# Patient Record
Sex: Male | Born: 1945 | Race: White | Hispanic: No | Marital: Married | Smoking: Never smoker
Health system: Southern US, Community
[De-identification: ages and names within clinical notes are randomized; demographics above are authoritative.]

## PROBLEM LIST (undated history)

## (undated) DIAGNOSIS — R059 Cough, unspecified: Secondary | ICD-10-CM

## (undated) DIAGNOSIS — R000 Tachycardia, unspecified: Secondary | ICD-10-CM

## (undated) DIAGNOSIS — N400 Enlarged prostate without lower urinary tract symptoms: Secondary | ICD-10-CM

## (undated) DIAGNOSIS — R05 Cough: Secondary | ICD-10-CM

## (undated) DIAGNOSIS — K219 Gastro-esophageal reflux disease without esophagitis: Secondary | ICD-10-CM

## (undated) DIAGNOSIS — I10 Essential (primary) hypertension: Secondary | ICD-10-CM

## (undated) DIAGNOSIS — J329 Chronic sinusitis, unspecified: Secondary | ICD-10-CM

## (undated) DIAGNOSIS — Z87442 Personal history of urinary calculi: Secondary | ICD-10-CM

## (undated) HISTORY — PX: TONSILLECTOMY: SUR1361

## (undated) HISTORY — PX: CATARACT EXTRACTION, BILATERAL: SHX1313

## (undated) HISTORY — PX: LITHOTRIPSY: SUR834

## (undated) HISTORY — PX: BACK SURGERY: SHX140

## (undated) HISTORY — PX: CHOLECYSTECTOMY: SHX55

## (undated) HISTORY — DX: Tachycardia, unspecified: R00.0

## (undated) HISTORY — PX: COLONOSCOPY W/ POLYPECTOMY: SHX1380

---

## 2003-08-26 ENCOUNTER — Ambulatory Visit (HOSPITAL_COMMUNITY): Admission: RE | Admit: 2003-08-26 | Discharge: 2003-08-26 | Payer: Self-pay | Admitting: Gastroenterology

## 2003-08-26 ENCOUNTER — Encounter (INDEPENDENT_AMBULATORY_CARE_PROVIDER_SITE_OTHER): Payer: Self-pay | Admitting: Specialist

## 2009-08-05 ENCOUNTER — Emergency Department (HOSPITAL_COMMUNITY): Admission: EM | Admit: 2009-08-05 | Discharge: 2009-08-05 | Payer: Self-pay | Admitting: Family Medicine

## 2010-04-23 ENCOUNTER — Emergency Department (HOSPITAL_COMMUNITY): Admission: EM | Admit: 2010-04-23 | Discharge: 2010-04-23 | Payer: Self-pay | Admitting: Emergency Medicine

## 2010-10-15 LAB — CBC
Hemoglobin: 17.5 g/dL — ABNORMAL HIGH (ref 13.0–17.0)
MCHC: 35.2 g/dL (ref 30.0–36.0)
Platelets: 233 10*3/uL (ref 150–400)
RDW: 12.5 % (ref 11.5–15.5)

## 2010-10-15 LAB — DIFFERENTIAL
Basophils Absolute: 0 10*3/uL (ref 0.0–0.1)
Basophils Relative: 0 % (ref 0–1)
Neutro Abs: 4.8 10*3/uL (ref 1.7–7.7)
Neutrophils Relative %: 62 % (ref 43–77)

## 2010-10-15 LAB — POCT CARDIAC MARKERS
CKMB, poc: <1 ng/mL — ABNORMAL LOW (ref 1.0–8.0)
Myoglobin, poc: 89.2 ng/mL (ref 12–200)

## 2010-10-15 LAB — BASIC METABOLIC PANEL
BUN: 14 mg/dL (ref 6–23)
Calcium: 10 mg/dL (ref 8.4–10.5)
Creatinine, Ser: 1.05 mg/dL (ref 0.4–1.5)
GFR calc non Af Amer: >60 mL/min (ref >60)
Glucose, Bld: 102 mg/dL — ABNORMAL HIGH (ref 70–99)
Sodium: 141 mEq/L (ref 135–145)

## 2010-12-18 NOTE — Op Note (Signed)
NAME:  Randall Lopez, Randall Lopez                     ACCOUNT NO.:  [id number]   MEDICAL RECORD NO.:  [id number]                   PATIENT TYPE:  AMB   LOCATION:  ENDO                                 FACILITY:  [facility]   PHYSICIAN:  Danise Edge, M.D.                DATE OF BIRTH:  [birth year]   DATE OF PROCEDURE:  08/26/2003  DATE OF DISCHARGE:                                 OPERATIVE REPORT   PROCEDURE:  Colonoscopy and polypectomy.   PROCEDURE INDICATION:  Mr. Randall Lopez is a 65 year old male born  [birth year].  Mr. Randall Lopez is scheduled to undergo his first screening  colonoscopy with polypectomy to prevent colon cancer.  His health  maintenance flexible proctosigmoidoscopy in February 1999 was normal.   ENDOSCOPIST:  Danise Edge, M.D.   PREMEDICATION:  Versed 5 mg, Demerol 50 mg.   DESCRIPTION OF PROCEDURE:  After obtaining informed consent, Mr. Randall Lopez was  placed in the left lateral decubitus position.  I administered intravenous  Demerol and intravenous Versed to achieve conscious sedation for the  procedure.  The patient's blood pressure, oxygen saturation, and cardiac  rhythm were monitored throughout the procedure and documented in the medical  record.   Anal inspection was normal, digital rectal exam revealed a non-nodular  prostate.  The Olympus adjustable pediatric colonoscope was introduced into  the rectum and advanced to the cecum.  Colonic preparation for the exam  today was excellent.   Rectum:  Normal.   Sigmoid colon and descending colon:  From the distal sigmoid colon at 30 cm  from the anal verge a 2-mm sessile polyp was removed with the electrocautery  snare and submitted for pathological interpretation.   Splenic flexure:  Normal.   Transverse colon:  Normal.   Hepatic flexure:  Normal.   Ascending colon:  Normal.   Cecum and ileocecal valve:  Normal.   ASSESSMENT:  From the distal sigmoid colon, a 2-mm sessile polyp was removed  with  the electrocautery snare; otherwise normal proctocolonoscopy to the  cecum.   RECOMMENDATION:  Repeat colonoscopy in 5 years if distal sigmoid colon polyp  returns neoplastic pathologically.  __________                                               Danise Edge, M.D.    MJ/MEDQ  D:  08/26/2003  T:  08/26/2003  Job:  [id number]

## 2011-12-09 ENCOUNTER — Ambulatory Visit
Admission: RE | Admit: 2011-12-09 | Discharge: 2011-12-09 | Disposition: A | Discharge status: Home / Self Care | Payer: Medicare Other | Source: Ambulatory Visit | Attending: Family Medicine | Admitting: Family Medicine

## 2011-12-09 ENCOUNTER — Other Ambulatory Visit: Payer: Self-pay | Admitting: Family Medicine

## 2011-12-09 DIAGNOSIS — M25569 Pain in unspecified knee: Secondary | ICD-10-CM

## 2011-12-11 ENCOUNTER — Emergency Department (HOSPITAL_COMMUNITY)
Admission: EM | Admit: 2011-12-11 | Discharge: 2011-12-11 | Disposition: A | Discharge status: Home / Self Care | Payer: Medicare Other | Attending: Emergency Medicine | Admitting: Emergency Medicine

## 2011-12-11 ENCOUNTER — Encounter (HOSPITAL_COMMUNITY): Payer: Self-pay | Admitting: *Deleted

## 2011-12-11 DIAGNOSIS — M25561 Pain in right knee: Secondary | ICD-10-CM

## 2011-12-11 DIAGNOSIS — K219 Gastro-esophageal reflux disease without esophagitis: Secondary | ICD-10-CM | POA: Insufficient documentation

## 2011-12-11 DIAGNOSIS — I10 Essential (primary) hypertension: Secondary | ICD-10-CM | POA: Insufficient documentation

## 2011-12-11 DIAGNOSIS — M25569 Pain in unspecified knee: Secondary | ICD-10-CM | POA: Insufficient documentation

## 2011-12-11 HISTORY — DX: Gastro-esophageal reflux disease without esophagitis: K21.9

## 2011-12-11 HISTORY — DX: Essential (primary) hypertension: I10

## 2011-12-11 MED ORDER — HYDROMORPHONE HCL PF 1 MG/ML IJ SOLN
1.0000 mg | Freq: Once | INTRAMUSCULAR | Status: AC
  Administered 2011-12-11: 1 mg via INTRAMUSCULAR
  Filled 2011-12-11: qty 1

## 2011-12-11 MED ORDER — IBUPROFEN 600 MG PO TABS: 600.0000 mg | ORAL_TABLET | Freq: Three times a day (TID) | ORAL | Status: DC

## 2011-12-11 MED ORDER — HYDROCODONE-ACETAMINOPHEN 5-325 MG PO TABS: 1.0000 | ORAL_TABLET | ORAL | Status: DC | PRN

## 2011-12-11 NOTE — ED Provider Notes (Signed)
History     CSN: [id number]  Arrival date & time 12/11/11  1008   First MD Initiated Contact with Patient 12/11/11 1010      Chief Complaint  Patient presents with  . Knee Pain    right    (Consider location/radiation/quality/duration/timing/severity/associated sxs/prior treatment) HPI Comments: Patient reports he has had intermittent knee pain chronically until 3 days ago when the pain became much worse.  Pain is located over the medial aspect with radiation down into shin anteriorly.  Pain is described as achy when not using the joint and sharp when bearing weight.  Was seen by PCP and given vicodin (1tabQ6) and prednisone, negative xray, was referred to Dr Rennis Chris.  Patient has not been able to speak with anyone in Dr Dub Mikes office yet.  Denies fevers, injury, weakness or numbness, swelling or redness. Pt has a hx of gout in his feet but states this is nothing like his previous gout.  Has attempted to walk with crutches but having the leg hang free causes more pain, as well as strain in his low back.    Patient is a 66 y.o. male presenting with knee pain. The history is provided by the patient.  Knee Pain Pertinent negatives include no chills, fever, numbness, rash or weakness.    Past Medical History  Diagnosis Date  . Hypertension   . GERD (gastroesophageal reflux disease)   . Kidney stones   . Gout     Past Surgical History  Procedure Date  . Tonsillectomy   . Lithotripsy     History reviewed. No pertinent family history.  History  Substance Use Topics  . Smoking status: Never Smoker   . Smokeless tobacco: Never Used  . Alcohol Use: No      Review of Systems  Constitutional: Negative for fever and chills.  Skin: Negative for rash and wound.  Neurological: Negative for weakness and numbness.    Allergies  Review of patient's allergies indicates no known allergies.  Home Medications  No current outpatient prescriptions on file.  BP 147/83  Pulse 87   Temp(Src) 98.3 F (36.8 C) (Oral)  Resp 18  Wt 195 lb (88.451 kg)  SpO2 100%  Physical Exam  Nursing note and vitals reviewed. Constitutional: He is oriented to person, place, and time. He appears well-developed and well-nourished.  HENT:  Head: Normocephalic and atraumatic.  Neck: Neck supple.  Pulmonary/Chest: Effort normal.  Musculoskeletal:       Right knee: He exhibits normal range of motion, no swelling, no effusion, no deformity, no laceration, no erythema, normal alignment, no LCL laxity, normal patellar mobility, no bony tenderness and no MCL laxity. no tenderness found.       Left knee: Normal.       Legs:      Mild pain with valgus stress  Neurological: He is alert and oriented to person, place, and time.    ED Course  Procedures (including critical care time)  Labs Reviewed - No data to display Dg Knee 1-2 Views Right  12/09/2011  *RADIOLOGY REPORT*  Clinical Data: Right knee pain, swelling, no injury  RIGHT KNEE - 1-2 VIEW  Comparison: None.  Findings: Standing views of the knees were obtained.  The knee joint spaces are relatively normal.  No fracture is seen.  No effusion is noted.  IMPRESSION: Negative.  Original Report Authenticated By: Juline Patch, M.D.   10:45 AM Patient also seen and examined by, discussed with Dr Juleen China.  1. Pain in right knee       MDM  Patient with pain in his right knee without injury, no fever, normal xray by PCP, uncontrolled on 1 vicodin q 6 hrs at home.  Pt has orthopedic follow up arranged by PCP though no appointment yet.  Doubt septic joint or gout.  No red flags for joint pain.  Pt also seen and examined by attending.  Pt d/c home with adjustment to pain medication (1-2 Q 4 hrs), knee immobilizer, crutches, ortho f/u.  Patient verbalizes understanding and agrees with plan.          Rise Patience, [place] 12/11/11 1055

## 2011-12-11 NOTE — ED Notes (Signed)
PA at bedside.

## 2011-12-11 NOTE — ED Notes (Signed)
Pt from home with reports of right knee pain that worsened on Thursday with pain radiating down shin area. Pt reports seeing PCP on Thursday with xrays done that were negative and was given Prednisone and Vicodin with no relief. Pt denies injury or recent surgery but reports that he is unable to bear weight on right knee and is trying to get appt with Dr. Rennis Chris.

## 2011-12-11 NOTE — Discharge Instructions (Signed)
Please wear the knee immobilizer and use crutches as needed for pain.  You may take 1-2 Vicodin every 4 hours as need.  Please also take the prescribed antiinflammatory.  Do not take additional tylenol while taking the Vicodin to avoid overdose.  Follow up with Dr Rennis Chris as soon as possible.  If you develop fevers, swelling in the joint, uncontrolled pain, or are unable to walk and are unable to see the orthopedist immediately, return to the ER for a recheck.  You may return to the ER at any time for worsening condition or any new symptoms that concern you.  Knee Pain The knee is the complex joint between your thigh and your lower leg. It is made up of bones, tendons, ligaments, and cartilage. The bones that make up the knee are:  The femur in the thigh.   The tibia and fibula in the lower leg.   The patella or kneecap riding in the groove on the lower femur.  CAUSES  Knee pain is a common complaint with many causes. A few of these causes are:  Injury, such as:   A ruptured ligament or tendon injury.   Torn cartilage.   Medical conditions, such as:   Gout   Arthritis   Infections   Overuse, over training or overdoing a physical activity.  Knee pain can be minor or severe. Knee pain can accompany debilitating injury. Minor knee problems often respond well to self-care measures or get well on their own. More serious injuries may need medical intervention or even surgery. SYMPTOMS The knee is complex. Symptoms of knee problems can vary widely. Some of the problems are:  Pain with movement and weight bearing.   Swelling and tenderness.   Buckling of the knee.   Inability to straighten or extend your knee.   Your knee locks and you cannot straighten it.   Warmth and redness with pain and fever.   Deformity or dislocation of the kneecap.  DIAGNOSIS  Determining what is wrong may be very straight forward such as when there is an injury. It can also be challenging because of the  complexity of the knee. Tests to make a diagnosis may include:  Your caregiver taking a history and doing a physical exam.   Routine X-rays can be used to rule out other problems. X-rays will not reveal a cartilage tear. Some injuries of the knee can be diagnosed by:   Arthroscopy a surgical technique by which a small video camera is inserted through tiny incisions on the sides of the knee. This procedure is used to examine and repair internal knee joint problems. Tiny instruments can be used during arthroscopy to repair the torn knee cartilage (meniscus).   Arthrography is a radiology technique. A contrast liquid is directly injected into the knee joint. Internal structures of the knee joint then become visible on X-ray film.   An MRI scan is a non x-ray radiology procedure in which magnetic fields and a computer produce two- or three-dimensional images of the inside of the knee. Cartilage tears are often visible using an MRI scanner. MRI scans have largely replaced arthrography in diagnosing cartilage tears of the knee.   Blood work.   Examination of the fluid that helps to lubricate the knee joint (synovial fluid). This is done by taking a sample out using a needle and a syringe.  TREATMENT The treatment of knee problems depends on the cause. Some of these treatments are:  Depending on the injury, proper casting,  splinting, surgery or physical therapy care will be needed.   Give yourself adequate recovery time. Do not overuse your joints. If you begin to get sore during workout routines, back off. Slow down or do fewer repetitions.   For repetitive activities such as cycling or running, maintain your strength and nutrition.   Alternate muscle groups. For example if you are a weight lifter, work the upper body on one day and the lower body the next.   Either tight or weak muscles do not give the proper support for your knee. Tight or weak muscles do not absorb the stress placed on the  knee joint. Keep the muscles surrounding the knee strong.   Take care of mechanical problems.   If you have flat feet, orthotics or special shoes may help. See your caregiver if you need help.   Arch supports, sometimes with wedges on the inner or outer aspect of the heel, can help. These can shift pressure away from the side of the knee most bothered by osteoarthritis.   A brace called an "unloader" brace also may be used to help ease the pressure on the most arthritic side of the knee.   If your caregiver has prescribed crutches, braces, wraps or ice, use as directed. The acronym for this is PRICE. This means protection, rest, ice, compression and elevation.   Nonsteroidal anti-inflammatory drugs (NSAID's), can help relieve pain. But if taken immediately after an injury, they may actually increase swelling. Take NSAID's with food in your stomach. Stop them if you develop stomach problems. Do not take these if you have a history of ulcers, stomach pain or bleeding from the bowel. Do not take without your caregiver's approval if you have problems with fluid retention, heart failure, or kidney problems.   For ongoing knee problems, physical therapy may be helpful.   Glucosamine and chondroitin are over-the-counter dietary supplements. Both may help relieve the pain of osteoarthritis in the knee. These medicines are different from the usual anti-inflammatory drugs. Glucosamine may decrease the rate of cartilage destruction.   Injections of a corticosteroid drug into your knee joint may help reduce the symptoms of an arthritis flare-up. They may provide pain relief that lasts a few months. You may have to wait a few months between injections. The injections do have a small increased risk of infection, water retention and elevated blood sugar levels.   Hyaluronic acid injected into damaged joints may ease pain and provide lubrication. These injections may work by reducing inflammation. A series of  shots may give relief for as long as 6 months.   Topical painkillers. Applying certain ointments to your skin may help relieve the pain and stiffness of osteoarthritis. Ask your pharmacist for suggestions. Many over the-counter products are approved for temporary relief of arthritis pain.   In some countries, doctors often prescribe topical NSAID's for relief of chronic conditions such as arthritis and tendinitis. A review of treatment with NSAID creams found that they worked as well as oral medications but without the serious side effects.  PREVENTION  Maintain a healthy weight. Extra pounds put more strain on your joints.   Get strong, stay limber. Weak muscles are a common cause of knee injuries. Stretching is important. Include flexibility exercises in your workouts.   Be smart about exercise. If you have osteoarthritis, chronic knee pain or recurring injuries, you may need to change the way you exercise. This does not mean you have to stop being active. If your knees ache  after jogging or playing basketball, consider switching to swimming, water aerobics or other low-impact activities, at least for a few days a week. Sometimes limiting high-impact activities will provide relief.   Make sure your shoes fit well. Choose footwear that is right for your sport.   Protect your knees. Use the proper gear for knee-sensitive activities. Use kneepads when playing volleyball or laying carpet. Buckle your seat belt every time you drive. Most shattered kneecaps occur in car accidents.   Rest when you are tired.  SEEK MEDICAL CARE IF:  You have knee pain that is continual and does not seem to be getting better.  SEEK IMMEDIATE MEDICAL CARE IF:  Your knee joint feels hot to the touch and you have a high fever. MAKE SURE YOU:   Understand these instructions.   Will watch your condition.   Will get help right away if you are not doing well or get worse.  Document Released: 05/16/2007 Document  Revised: 07/08/2011 Document Reviewed: 05/16/2007 [facility] Patient Information 2012 [place], [place].

## 2011-12-15 NOTE — ED Provider Notes (Signed)
Medical screening examination/treatment/procedure(s) were performed by non-physician practitioner and as supervising physician I was immediately available for consultation/collaboration.  Raeford Razor, MD 12/15/11 [phone]

## 2011-12-16 ENCOUNTER — Encounter (HOSPITAL_COMMUNITY): Payer: Self-pay | Admitting: Emergency Medicine

## 2011-12-16 ENCOUNTER — Ambulatory Visit
Admission: RE | Admit: 2011-12-16 | Discharge: 2011-12-16 | Disposition: A | Discharge status: Home / Self Care | Payer: Medicare Other | Source: Ambulatory Visit | Attending: Orthopedic Surgery | Admitting: Orthopedic Surgery

## 2011-12-16 ENCOUNTER — Inpatient Hospital Stay (HOSPITAL_COMMUNITY): Payer: Medicare Other

## 2011-12-16 ENCOUNTER — Inpatient Hospital Stay (HOSPITAL_COMMUNITY)
Admission: EM | Admit: 2011-12-16 | Discharge: 2011-12-18 | DRG: 491 | Disposition: A | Discharge status: Home / Self Care | Payer: Medicare Other | Attending: Orthopedic Surgery | Admitting: Orthopedic Surgery

## 2011-12-16 ENCOUNTER — Other Ambulatory Visit: Payer: Self-pay | Admitting: Orthopedic Surgery

## 2011-12-16 DIAGNOSIS — M5126 Other intervertebral disc displacement, lumbar region: Principal | ICD-10-CM | POA: Diagnosis present

## 2011-12-16 DIAGNOSIS — M545 Low back pain: Secondary | ICD-10-CM

## 2011-12-16 DIAGNOSIS — Z87442 Personal history of urinary calculi: Secondary | ICD-10-CM

## 2011-12-16 DIAGNOSIS — I10 Essential (primary) hypertension: Secondary | ICD-10-CM | POA: Diagnosis present

## 2011-12-16 DIAGNOSIS — K219 Gastro-esophageal reflux disease without esophagitis: Secondary | ICD-10-CM | POA: Diagnosis present

## 2011-12-16 DIAGNOSIS — M109 Gout, unspecified: Secondary | ICD-10-CM | POA: Diagnosis present

## 2011-12-16 LAB — CBC
MCV: 83.5 fL (ref 78.0–100.0)
Platelets: 295 10*3/uL (ref 150–400)
RBC: 6.13 MIL/uL — ABNORMAL HIGH (ref 4.22–5.81)
WBC: 11.9 10*3/uL — ABNORMAL HIGH (ref 4.0–10.5)

## 2011-12-16 LAB — COMPREHENSIVE METABOLIC PANEL
Albumin: 4.2 g/dL (ref 3.5–5.2)
BUN: 21 mg/dL (ref 6–23)
Calcium: 9.5 mg/dL (ref 8.4–10.5)
Chloride: 96 mEq/L (ref 96–112)
Creatinine, Ser: 0.96 mg/dL (ref 0.50–1.35)
Total Bilirubin: 0.8 mg/dL (ref 0.3–1.2)
Total Protein: 7.2 g/dL (ref 6.0–8.3)

## 2011-12-16 LAB — DIFFERENTIAL
Basophils Absolute: 0 10*3/uL (ref 0.0–0.1)
Basophils Relative: 0 % (ref 0–1)
Eosinophils Absolute: 0.3 10*3/uL (ref 0.0–0.7)
Lymphocytes Relative: 17 % (ref 12–46)
Lymphs Abs: 2 10*3/uL (ref 0.7–4.0)
Monocytes Absolute: 1.5 10*3/uL — ABNORMAL HIGH (ref 0.1–1.0)
Monocytes Relative: 12 % (ref 3–12)

## 2011-12-16 LAB — PROTIME-INR: INR: 0.96 (ref 0.00–1.49)

## 2011-12-16 MED ORDER — DOCUSATE SODIUM 100 MG PO CAPS
100.0000 mg | ORAL_CAPSULE | Freq: Two times a day (BID) | ORAL | Status: DC
  Administered 2011-12-16 – 2011-12-18 (×3): 100 mg via ORAL

## 2011-12-16 MED ORDER — DIAZEPAM 5 MG/ML IJ SOLN
10.0000 mg | Freq: Four times a day (QID) | INTRAMUSCULAR | Status: DC | PRN
  Administered 2011-12-16: 10 mg via INTRAVENOUS
  Filled 2011-12-16: qty 2

## 2011-12-16 MED ORDER — MORPHINE SULFATE (PF) 1 MG/ML IV SOLN
INTRAVENOUS | Status: DC
  Administered 2011-12-16: 23:00:00 via INTRAVENOUS
  Administered 2011-12-17: 8 mg via INTRAVENOUS
  Administered 2011-12-17: 5 mg via INTRAVENOUS
  Administered 2011-12-17: 10:00:00 via INTRAVENOUS
  Filled 2011-12-16 (×2): qty 25

## 2011-12-16 MED ORDER — METHOCARBAMOL 100 MG/ML IJ SOLN
500.0000 mg | Freq: Three times a day (TID) | INTRAVENOUS | Status: DC | PRN
  Filled 2011-12-16: qty 5

## 2011-12-16 MED ORDER — SODIUM CHLORIDE 0.9 % IJ SOLN: 9.0000 mL | INTRAMUSCULAR | Status: DC | PRN

## 2011-12-16 MED ORDER — NALOXONE HCL 0.4 MG/ML IJ SOLN: 0.4000 mg | INTRAMUSCULAR | Status: DC | PRN

## 2011-12-16 MED ORDER — HYDROMORPHONE HCL PF 1 MG/ML IJ SOLN
1.0000 mg | INTRAMUSCULAR | Status: DC | PRN
  Administered 2011-12-16: 1 mg via INTRAVENOUS
  Filled 2011-12-16: qty 1

## 2011-12-16 MED ORDER — BISACODYL 5 MG PO TBEC
5.0000 mg | DELAYED_RELEASE_TABLET | Freq: Every day | ORAL | Status: DC | PRN
  Filled 2011-12-16: qty 1

## 2011-12-16 MED ORDER — ZOLPIDEM TARTRATE 10 MG PO TABS: 10.0000 mg | ORAL_TABLET | Freq: Every evening | ORAL | Status: DC | PRN

## 2011-12-16 MED ORDER — ONDANSETRON HCL 4 MG/2ML IJ SOLN: 4.0000 mg | Freq: Four times a day (QID) | INTRAMUSCULAR | Status: DC | PRN

## 2011-12-16 MED ORDER — DIPHENHYDRAMINE HCL 50 MG/ML IJ SOLN: 12.5000 mg | Freq: Four times a day (QID) | INTRAMUSCULAR | Status: DC | PRN

## 2011-12-16 MED ORDER — DIAZEPAM 5 MG PO TABS
10.0000 mg | ORAL_TABLET | Freq: Four times a day (QID) | ORAL | Status: DC | PRN
  Administered 2011-12-17 – 2011-12-18 (×2): 10 mg via ORAL
  Filled 2011-12-16: qty 1
  Filled 2011-12-16: qty 2
  Filled 2011-12-16: qty 1

## 2011-12-16 MED ORDER — DIPHENHYDRAMINE HCL 12.5 MG/5ML PO ELIX: 12.5000 mg | ORAL_SOLUTION | Freq: Four times a day (QID) | ORAL | Status: DC | PRN

## 2011-12-16 MED ORDER — LACTATED RINGERS IV SOLN
INTRAVENOUS | Status: DC
  Administered 2011-12-16: 20 mL/h via INTRAVENOUS

## 2011-12-16 NOTE — ED Notes (Signed)
Pt with back pain that radiates to right knee was trying to have an MRI today but was unable to due to the pain after 2 mg dilaudid and 5 mg of valium. Pt was sent here by Dr Ermelinda Das office-[facility]. Pt reports pain to right knee and shin at 7/10.

## 2011-12-16 NOTE — H&P (Signed)
Note reviewed Case discussed with Dr Rennis Chris Will evaluate MRI and discuss treatment plan with patient in the morning.

## 2011-12-16 NOTE — ED Notes (Signed)
Pt will be admitted and MRI done after pt is sedated with IV meds.

## 2011-12-16 NOTE — ED Notes (Signed)
Patient transported to MRI with RN Aram Beecham. On 2L n/c and continuous pulse ox, ambu-bag with pt

## 2011-12-16 NOTE — ED Provider Notes (Signed)
History     CSN: [id number]  Arrival date & time 12/16/11  1445   First MD Initiated Contact with Patient 12/16/11 1509      No chief complaint on file.   (Consider location/radiation/quality/duration/timing/severity/associated sxs/prior treatment) HPI Comments: Patient comes in today with a chief complaint of lower back pain and right knee pain.  He was seen by Dr. Rennis Chris three days ago for this pain and was scheduled for a MRI today.  He was at [facility] just prior to arrival in the ED and was unable to complete the MRI due to pain.  He had taken two dilaudid and two valium prior to going for the MRI.  Apparently he was told by Dr. Dub Mikes nurse to come to the Emergency Department.  He is unsure as to what the plan is from there.    Patient is a 66 y.o. male presenting with back pain. The history is provided by the patient and the spouse.  Back Pain  This is a new problem. Episode onset: one week. The pain is associated with no known injury. The pain is present in the lumbar spine. The quality of the pain is described as shooting. The pain radiates to the right knee. The pain is severe. The symptoms are aggravated by bending (laying flat). Pertinent negatives include no fever, no numbness, no abdominal pain, no bowel incontinence, no perianal numbness, no bladder incontinence, no dysuria, no paresthesias, no paresis, no tingling and no weakness.    Past Medical History  Diagnosis Date  . Hypertension   . GERD (gastroesophageal reflux disease)   . Kidney stones   . Gout     Past Surgical History  Procedure Date  . Tonsillectomy   . Lithotripsy     No family history on file.  History  Substance Use Topics  . Smoking status: Never Smoker   . Smokeless tobacco: Never Used  . Alcohol Use: No      Review of Systems  Constitutional: Negative for fever.  Gastrointestinal: Negative for abdominal pain and bowel incontinence.  Genitourinary: Negative for bladder  incontinence and dysuria.  Musculoskeletal: Positive for back pain.  Neurological: Negative for tingling, weakness, numbness and paresthesias.    Allergies  Review of patient's allergies indicates no known allergies.  Home Medications   Current Outpatient Rx  Name Route Sig Dispense Refill  . ASPIRIN EC 81 MG PO TBEC Oral Take 81 mg by mouth daily.    Marland Kitchen FOLIC ACID 400 MCG PO TABS Oral Take 400 mcg by mouth daily.    Marland Kitchen OMEPRAZOLE 20 MG PO CPDR Oral Take 20 mg by mouth daily.    . ACETAMINOPHEN 500 MG PO TABS Oral Take 1,000 mg by mouth every 6 (six) hours as needed. pain    . INDAPAMIDE 1.25 MG PO TABS Oral Take 1.25 mg by mouth every morning.    Marland Kitchen OMEGA-3-ACID ETHYL ESTERS 1 G PO CAPS Oral Take 2 g by mouth 2 (two) times daily.    Marland Kitchen POTASSIUM CHLORIDE CRYS ER 20 MEQ PO TBCR Oral Take 20 mEq by mouth daily.     Marland Kitchen PREDNISONE (PAK) 10 MG PO TABS Oral Take 10 mg by mouth See admin instructions. Dose pack started on 12/09/11. On 40 mg dose today      BP 136/79  Pulse 111  Temp 99.3 F (37.4 C)  Resp 18  SpO2 100%  Physical Exam  Nursing note and vitals reviewed. Constitutional: He is oriented to person, place,  and time. He appears well-developed and well-nourished. No distress.  HENT:  Head: Normocephalic and atraumatic.  Eyes: Conjunctivae and EOM are normal.  Neck: Normal range of motion and full passive range of motion without pain. Neck supple. No spinous process tenderness and no muscular tenderness present. No rigidity. Normal range of motion present.  Cardiovascular: Normal rate, regular rhythm, normal heart sounds and intact distal pulses.   Pulmonary/Chest: Effort normal and breath sounds normal. No respiratory distress. He has no wheezes. He has no rales. He exhibits no tenderness.  Musculoskeletal: Normal range of motion.       Cervical back: He exhibits normal range of motion, no tenderness, no bony tenderness and no pain.       Thoracic back: He exhibits no tenderness,  no bony tenderness and no pain.       Lumbar back: He exhibits pain. He exhibits no tenderness, no bony tenderness, no spasm and normal pulse.       Bilateral lower extremities nontender without color change, baseline range of motion of extremities with intact distal pulses, capillary refill less than 2 seconds bilaterally.  Pt has increased pain w ROM of lumbar spine. Pain w ambulation, no sign of ataxia.  Neurological: He is alert and oriented to person, place, and time. He has normal strength and normal reflexes. No sensory deficit. Gait (no ataxia, slowed and hunched d/t pain ) abnormal.       sensation at baseline for light touch in all 4 distal extremities, motor symmetric & bilateral 5/5  Abduction,adduction,flexion of hips, knee flexion & extension, foot dorsiflexion, foot plantar flexion.  Skin: Skin is warm and dry. No rash noted. He is not diaphoretic. No erythema. No pallor.  Psychiatric: He has a normal mood and affect.    ED Course  Procedures (including critical care time)  Labs Reviewed - No data to display No results found.   No diagnosis found.  3:38 PM Discussed with Dr. Dub Mikes nurse Tresa Endo and was told to call the on call Orthopedist Dr. Jillyn Hidden.  Was told that Dr. Rennis Chris had discussed the patient with Dr. Jillyn Hidden.   4:28 PM Discussed with the Orthopedic PA who reports that she will admit the patient.  MDM  Patient comes in today with significant lower back pain radiating to his right leg.  He has been seen by Dr. Rennis Chris with Orthopedics for this and was supposed to have MRI scheduled today.  He was unable to get the MRI due to pain.  Patient evaluated in the ED by Orthopedic PA who will admit patient.          Pascal Lux [place], PA-C 12/18/11 [phone]

## 2011-12-16 NOTE — H&P (Signed)
Randall Lopez    Chief Complaint: Severe Right leg and back pain HPI: The patient is a 66 y.o. male who has had a one week history of initially severe "knee and shin pain" with attempts at ambulating. He then developed rather significant back pain. He had seen his primary physician last week and ultimately went to ER for his knee pain and was referred to our office on Tuesday. At that time he was found to have a benign knee exam but his patella reflex on the right was out and exam was more suggestive of an acute HNP of lumbar spine. We had scheduled him for MRI however his pain has been so severe despite po valium and dilaudid he was unable to complete. We are now admitting him for further workup and pain control.  Past Medical History  Diagnosis Date  . Hypertension   . GERD (gastroesophageal reflux disease)   . Kidney stones   . Gout     Past Surgical History  Procedure Date  . Tonsillectomy   . Lithotripsy     No family history on file.  Social History:  reports that he has never smoked. He has never used smokeless tobacco. He reports that he does not drink alcohol or use illicit drugs.  Allergies: No Known Allergies  Prior to Admission medications   Medication Sig Start Date End Date Taking? Authorizing Provider  acetaminophen (TYLENOL) 500 MG tablet Take 1,000 mg by mouth every 6 (six) hours as needed. pain   Yes Historical Provider, MD  aspirin EC 81 MG tablet Take 81 mg by mouth daily.   Yes Historical Provider, MD  diazepam (VALIUM) 5 MG tablet Take 5 mg by mouth every 6 (six) hours as needed.   Yes Historical Provider, MD  diclofenac (VOLTAREN) 50 MG EC tablet Take 50 mg by mouth 3 (three) times daily.   Yes Historical Provider, MD  folic acid (FOLVITE) 400 MCG tablet Take 400 mcg by mouth daily.   Yes Historical Provider, MD  HYDROmorphone (DILAUDID) 2 MG tablet Take 2-4 mg by mouth every 4 (four) hours as needed.   Yes Historical Provider, MD  indapamide (LOZOL) 1.25  MG tablet Take 1.25 mg by mouth every morning.   Yes Historical Provider, MD  omega-3 acid ethyl esters (LOVAZA) 1 G capsule Take 2 g by mouth 2 (two) times daily.   Yes Historical Provider, MD  omeprazole (PRILOSEC) 20 MG capsule Take 20 mg by mouth daily.   Yes Historical Provider, MD  oxyCODONE-acetaminophen (PERCOCET) 5-325 MG per tablet Take 1-2 tablets by mouth every 4 (four) hours as needed.   Yes Historical Provider, MD  potassium chloride SA (K-DUR,KLOR-CON) 20 MEQ tablet Take 20 mEq by mouth daily.    Yes Historical Provider, MD  predniSONE (STERAPRED UNI-PAK) 10 MG tablet Take 10 mg by mouth See admin instructions. Dose pack started on 12/09/11. On 40 mg dose today    Historical Provider, MD    ROS: he was in usual state of health, denies fevers chills. Denies loss of any bowel or bladder control and no parasthesias.  Physical Exam: Pt sits for exam, he is unable to stand or lie down secondary to severe right knee and shin pain. in office he had absent patella reflex. currently has good DF/EHL with a negative straight leg raise in seated position. Vitals Temp:  [99.3 F (37.4 C)] 99.3 F (37.4 C) (05/16 1504) Pulse Rate:  [111] 111  (05/16 1504) Resp:  [18] 18  (  05/16 1504) BP: (136)/(79) 136/79 mmHg (05/16 1504) SpO2:  [100 %] 100 % (05/16 1504)  Assessment/Plan Impression: Acute lumbar HNP Plan of Action: Admit for MRI of lumbar spine with some IV analgesia and sedation. We have discussed case with Dr. Shon Baton who will see patient tomorrow and will keep patient npo after midnight in case there is a need for surgery.  [facility] for Dr. Francena Hanly 12/16/2011, 4:22 PM

## 2011-12-16 NOTE — Progress Notes (Signed)
Call attempt to Randall Lopez orthopedic PA unsuccessful (admission)

## 2011-12-16 NOTE — Progress Notes (Signed)
Pharmacy Brief Note:  Home medications list has not yet been addressed by physician.  Please review list and indicate which medications are to be resumed.  Thank you - Elie Goody, PharmD, BCPS Pager: [phone] 12/16/2011  10:22 PM

## 2011-12-16 NOTE — H&P (Signed)
Full note dictated:  [id number] Agree with PA note Plan on surgery tomorrow

## 2011-12-16 NOTE — ED Notes (Signed)
PA at bedside.

## 2011-12-17 ENCOUNTER — Encounter (HOSPITAL_COMMUNITY): Payer: Self-pay | Admitting: Anesthesiology

## 2011-12-17 ENCOUNTER — Inpatient Hospital Stay (HOSPITAL_COMMUNITY): Payer: Medicare Other

## 2011-12-17 ENCOUNTER — Encounter (HOSPITAL_COMMUNITY)
Admission: EM | Disposition: A | Discharge status: Home / Self Care | Payer: Self-pay | Source: Home / Self Care | Attending: Orthopedic Surgery

## 2011-12-17 ENCOUNTER — Inpatient Hospital Stay (HOSPITAL_COMMUNITY): Payer: Medicare Other | Admitting: Anesthesiology

## 2011-12-17 HISTORY — PX: LUMBAR LAMINECTOMY/DECOMPRESSION MICRODISCECTOMY: SHX5026

## 2011-12-17 LAB — SURGICAL PCR SCREEN: MRSA, PCR: NEGATIVE

## 2011-12-17 SURGERY — LUMBAR LAMINECTOMY/DECOMPRESSION MICRODISCECTOMY
Anesthesia: General | Site: Back | Laterality: Right | Wound class: Clean

## 2011-12-17 MED ORDER — LACTATED RINGERS IV SOLN
INTRAVENOUS | Status: DC | PRN
  Administered 2011-12-17 (×2): via INTRAVENOUS

## 2011-12-17 MED ORDER — MEPERIDINE HCL 50 MG/ML IJ SOLN: 6.2500 mg | INTRAMUSCULAR | Status: DC | PRN

## 2011-12-17 MED ORDER — POTASSIUM CHLORIDE CRYS ER 20 MEQ PO TBCR
20.0000 meq | EXTENDED_RELEASE_TABLET | Freq: Every day | ORAL | Status: DC
  Administered 2011-12-17 – 2011-12-18 (×2): 20 meq via ORAL
  Filled 2011-12-17 (×2): qty 1

## 2011-12-17 MED ORDER — ACETAMINOPHEN 10 MG/ML IV SOLN
INTRAVENOUS | Status: DC | PRN
  Administered 2011-12-17: 1000 mg via INTRAVENOUS

## 2011-12-17 MED ORDER — LIDOCAINE HCL (CARDIAC) 20 MG/ML IV SOLN
INTRAVENOUS | Status: DC | PRN
  Administered 2011-12-17: 100 mg via INTRAVENOUS

## 2011-12-17 MED ORDER — NEOSTIGMINE METHYLSULFATE 1 MG/ML IJ SOLN
INTRAMUSCULAR | Status: DC | PRN
  Administered 2011-12-17: 3 mg via INTRAVENOUS

## 2011-12-17 MED ORDER — PROPOFOL 10 MG/ML IV EMUL
INTRAVENOUS | Status: DC | PRN
  Administered 2011-12-17: 180 mg via INTRAVENOUS

## 2011-12-17 MED ORDER — SODIUM CHLORIDE 0.9 % IJ SOLN: 3.0000 mL | INTRAMUSCULAR | Status: DC | PRN

## 2011-12-17 MED ORDER — CEFAZOLIN SODIUM 1-5 GM-% IV SOLN
1.0000 g | Freq: Three times a day (TID) | INTRAVENOUS | Status: AC
  Administered 2011-12-17 – 2011-12-18 (×2): 1 g via INTRAVENOUS
  Filled 2011-12-17 (×2): qty 50

## 2011-12-17 MED ORDER — LIDOCAINE HCL 4 % MT SOLN
OROMUCOSAL | Status: DC | PRN
  Administered 2011-12-17: 4 mL via TOPICAL

## 2011-12-17 MED ORDER — PANTOPRAZOLE SODIUM 40 MG PO TBEC
40.0000 mg | DELAYED_RELEASE_TABLET | Freq: Every day | ORAL | Status: DC
  Administered 2011-12-17 – 2011-12-18 (×2): 40 mg via ORAL
  Filled 2011-12-17 (×2): qty 1

## 2011-12-17 MED ORDER — HYDROMORPHONE HCL PF 1 MG/ML IJ SOLN: 0.2500 mg | INTRAMUSCULAR | Status: DC | PRN

## 2011-12-17 MED ORDER — LACTATED RINGERS IV SOLN: INTRAVENOUS | Status: DC

## 2011-12-17 MED ORDER — ONDANSETRON HCL 4 MG/2ML IJ SOLN
INTRAMUSCULAR | Status: DC | PRN
  Administered 2011-12-17 (×2): 2 mg via INTRAVENOUS

## 2011-12-17 MED ORDER — ZOLPIDEM TARTRATE 10 MG PO TABS: 10.0000 mg | ORAL_TABLET | Freq: Every evening | ORAL | Status: DC | PRN

## 2011-12-17 MED ORDER — GLYCOPYRROLATE 0.2 MG/ML IJ SOLN
INTRAMUSCULAR | Status: DC | PRN
  Administered 2011-12-17: 0.4 mg via INTRAVENOUS

## 2011-12-17 MED ORDER — SODIUM CHLORIDE 0.9 % IV SOLN: 250.0000 mL | INTRAVENOUS | Status: DC

## 2011-12-17 MED ORDER — DEXAMETHASONE SODIUM PHOSPHATE 4 MG/ML IJ SOLN
4.0000 mg | Freq: Four times a day (QID) | INTRAMUSCULAR | Status: DC
  Filled 2011-12-17 (×6): qty 1

## 2011-12-17 MED ORDER — METHOCARBAMOL 100 MG/ML IJ SOLN
500.0000 mg | Freq: Four times a day (QID) | INTRAMUSCULAR | Status: DC | PRN
  Filled 2011-12-17: qty 5

## 2011-12-17 MED ORDER — DEXAMETHASONE SODIUM PHOSPHATE 10 MG/ML IJ SOLN
INTRAMUSCULAR | Status: DC | PRN
  Administered 2011-12-17: 10 mg via INTRAVENOUS

## 2011-12-17 MED ORDER — MORPHINE SULFATE 2 MG/ML IJ SOLN: 1.0000 mg | INTRAMUSCULAR | Status: DC | PRN

## 2011-12-17 MED ORDER — SUCCINYLCHOLINE CHLORIDE 20 MG/ML IJ SOLN
INTRAMUSCULAR | Status: DC | PRN
  Administered 2011-12-17: 100 mg via INTRAVENOUS

## 2011-12-17 MED ORDER — THROMBIN 5000 UNITS EX SOLR
CUTANEOUS | Status: DC | PRN
  Administered 2011-12-17: 5000 [IU] via TOPICAL

## 2011-12-17 MED ORDER — LACTATED RINGERS IV SOLN
INTRAVENOUS | Status: DC
  Administered 2011-12-17 – 2011-12-18 (×2): via INTRAVENOUS

## 2011-12-17 MED ORDER — MENTHOL 3 MG MT LOZG
1.0000 | LOZENGE | OROMUCOSAL | Status: DC | PRN
  Filled 2011-12-17: qty 9

## 2011-12-17 MED ORDER — INDAPAMIDE 1.25 MG PO TABS
1.2500 mg | ORAL_TABLET | Freq: Every day | ORAL | Status: DC
Start: 2011-12-18 — End: 2011-12-18
  Administered 2011-12-18: 1.25 mg via ORAL
  Filled 2011-12-17 (×2): qty 1

## 2011-12-17 MED ORDER — ONDANSETRON HCL 4 MG/2ML IJ SOLN: 4.0000 mg | INTRAMUSCULAR | Status: DC | PRN

## 2011-12-17 MED ORDER — METHOCARBAMOL 500 MG PO TABS: 500.0000 mg | ORAL_TABLET | Freq: Four times a day (QID) | ORAL | Status: DC | PRN

## 2011-12-17 MED ORDER — PHENOL 1.4 % MT LIQD
1.0000 | OROMUCOSAL | Status: DC | PRN
  Filled 2011-12-17: qty 177

## 2011-12-17 MED ORDER — CISATRACURIUM BESYLATE 2 MG/ML IV SOLN
INTRAVENOUS | Status: DC | PRN
  Administered 2011-12-17: 6 mg via INTRAVENOUS

## 2011-12-17 MED ORDER — FENTANYL CITRATE 0.05 MG/ML IJ SOLN
INTRAMUSCULAR | Status: DC | PRN
  Administered 2011-12-17: 25 ug via INTRAVENOUS
  Administered 2011-12-17: 100 ug via INTRAVENOUS
  Administered 2011-12-17: 25 ug via INTRAVENOUS
  Administered 2011-12-17 (×2): 50 ug via INTRAVENOUS

## 2011-12-17 MED ORDER — SODIUM CHLORIDE 0.9 % IJ SOLN: 3.0000 mL | Freq: Two times a day (BID) | INTRAMUSCULAR | Status: DC

## 2011-12-17 MED ORDER — PROMETHAZINE HCL 25 MG/ML IJ SOLN: 6.2500 mg | INTRAMUSCULAR | Status: DC | PRN

## 2011-12-17 MED ORDER — DEXAMETHASONE 4 MG PO TABS
4.0000 mg | ORAL_TABLET | Freq: Four times a day (QID) | ORAL | Status: DC
  Administered 2011-12-17 – 2011-12-18 (×3): 4 mg via ORAL
  Filled 2011-12-17 (×6): qty 1

## 2011-12-17 MED ORDER — 0.9 % SODIUM CHLORIDE (POUR BTL) OPTIME
TOPICAL | Status: DC | PRN
  Administered 2011-12-17: 1000 mL

## 2011-12-17 MED ORDER — OXYCODONE HCL 5 MG PO TABS
10.0000 mg | ORAL_TABLET | ORAL | Status: DC | PRN
  Administered 2011-12-18: 10 mg via ORAL
  Filled 2011-12-17: qty 2

## 2011-12-17 MED ORDER — CEFAZOLIN SODIUM 1-5 GM-% IV SOLN
INTRAVENOUS | Status: DC | PRN
  Administered 2011-12-17: 2 g via INTRAVENOUS

## 2011-12-17 MED ORDER — BUPIVACAINE-EPINEPHRINE PF 0.25-1:200000 % IJ SOLN
INTRAMUSCULAR | Status: DC | PRN
  Administered 2011-12-17: 10 mL

## 2011-12-17 MED ORDER — MIDAZOLAM HCL 5 MG/5ML IJ SOLN
INTRAMUSCULAR | Status: DC | PRN
  Administered 2011-12-17: 1 mg via INTRAVENOUS

## 2011-12-17 SURGICAL SUPPLY — 53 items
BAG ZIPLOCK 12X15 (MISCELLANEOUS) ×2 IMPLANT
BANDAGE GAUZE ELAST BULKY 4 IN (GAUZE/BANDAGES/DRESSINGS) IMPLANT
CLEANER TIP ELECTROSURG 2X2 (MISCELLANEOUS) ×2 IMPLANT
CLOTH BEACON ORANGE TIMEOUT ST (SAFETY) ×2 IMPLANT
CLSR STERI-STRIP ANTIMIC 1/2X4 (GAUZE/BANDAGES/DRESSINGS) ×2 IMPLANT
CONT SPECI 4OZ STER CLIK (MISCELLANEOUS) ×2 IMPLANT
COVER MAYO STAND STRL (DRAPES) ×4 IMPLANT
DRAIN PENROSE 18X1/4 LTX STRL (WOUND CARE) IMPLANT
DRAPE C-ARM 42X72 X-RAY (DRAPES) ×2 IMPLANT
DRAPE LAPAROTOMY T 102X78X121 (DRAPES) ×2 IMPLANT
DRAPE LG THREE QUARTER DISP (DRAPES) ×2 IMPLANT
DRAPE ORTHO SPLIT 77X108 STRL (DRAPES) ×2
DRAPE POUCH INSTRU U-SHP 10X18 (DRAPES) ×2 IMPLANT
DRAPE SURG 17X11 SM STRL (DRAPES) ×2 IMPLANT
DRAPE SURG ORHT 6 SPLT 77X108 (DRAPES) ×2 IMPLANT
DRAPE U-SHAPE 47X51 STRL (DRAPES) ×2 IMPLANT
DRSG ADAPTIC 3X8 NADH LF (GAUZE/BANDAGES/DRESSINGS) ×2 IMPLANT
DRSG EMULSION OIL 3X3 NADH (GAUZE/BANDAGES/DRESSINGS) ×2 IMPLANT
DRSG MEPILEX BORDER 4X4 (GAUZE/BANDAGES/DRESSINGS) ×4 IMPLANT
DRSG TELFA 4X5 ISLAND ADH (GAUZE/BANDAGES/DRESSINGS) ×2 IMPLANT
DURAPREP 26ML APPLICATOR (WOUND CARE) ×2 IMPLANT
ELECT BLADE TIP CTD 4 INCH (ELECTRODE) ×2 IMPLANT
ELECT REM PT RETURN 9FT ADLT (ELECTROSURGICAL) ×2
ELECTRODE REM PT RTRN 9FT ADLT (ELECTROSURGICAL) ×1 IMPLANT
FLOSEAL IMPLANT
FLOSEAL (HEMOSTASIS) IMPLANT
FLOSEAL 10ML (HEMOSTASIS) ×2 IMPLANT
GLOVE BIOGEL PI IND STRL 6 (GLOVE) IMPLANT
GLOVE BIOGEL PI INDICATOR 6 (GLOVE)
GLOVE ECLIPSE 8.5 STRL (GLOVE) ×2 IMPLANT
GOWN STRL REIN XL XLG (GOWN DISPOSABLE) ×4 IMPLANT
KIT BASIN OR (CUSTOM PROCEDURE TRAY) ×2 IMPLANT
MANIFOLD NEPTUNE II (INSTRUMENTS) IMPLANT
NEEDLE SPNL 18GX3.5 QUINCKE PK (NEEDLE) ×4 IMPLANT
NS IRRIG 1000ML POUR BTL (IV SOLUTION) ×2 IMPLANT
PATTIES SURGICAL .5 X.5 (GAUZE/BANDAGES/DRESSINGS) IMPLANT
POSITIONER SURGICAL ARM (MISCELLANEOUS) IMPLANT
SPONGE LAP 4X18 X RAY DECT (DISPOSABLE) IMPLANT
SPONGE SURGIFOAM ABS GEL 100 (HEMOSTASIS) ×2 IMPLANT
STAPLER VISISTAT 35W (STAPLE) IMPLANT
STRIP CLOSURE SKIN 1/2X4 (GAUZE/BANDAGES/DRESSINGS) ×2 IMPLANT
SUT MNCRL AB 3-0 PS2 18 (SUTURE) ×2 IMPLANT
SUT VIC AB 1 CT1 27 (SUTURE) ×1
SUT VIC AB 1 CT1 27XBRD ANTBC (SUTURE) ×1 IMPLANT
SUT VIC AB 2-0 CT1 18 (SUTURE) ×2 IMPLANT
SUT VIC AB 2-0 CT1 27 (SUTURE) ×1
SUT VIC AB 2-0 CT1 TAPERPNT 27 (SUTURE) ×1 IMPLANT
SUT VICRYL 0 UR6 27IN ABS (SUTURE) ×2 IMPLANT
TOWEL OR 17X26 10 PK STRL BLUE (TOWEL DISPOSABLE) ×4 IMPLANT
TRAY FOLEY CATH 14FRSI W/METER (CATHETERS) ×2 IMPLANT
TRAY LAMINECTOMY (CUSTOM PROCEDURE TRAY) ×2 IMPLANT
WATER STERILE IRR 1500ML POUR (IV SOLUTION) IMPLANT
YANKAUER SUCT BULB TIP NO VENT (SUCTIONS) ×2 IMPLANT

## 2011-12-17 NOTE — Transfer of Care (Signed)
Immediate Anesthesia Transfer of Care Note  Patient: Randall Lopez  Procedure(s) Performed: Procedure(s) (LRB): LUMBAR LAMINECTOMY/DECOMPRESSION MICRODISCECTOMY (Right)  Patient Location: PACU  Anesthesia Type: General  Level of Consciousness: awake, alert , oriented and patient cooperative  Airway & Oxygen Therapy: Patient Spontanous Breathing and Patient connected to face mask oxygen  Post-op Assessment: Report given to PACU RN, Post -op Vital signs reviewed and stable and Patient moving all extremities X 4  Post vital signs: Reviewed and stable  Complications: No apparent anesthesia complications

## 2011-12-17 NOTE — Anesthesia Preprocedure Evaluation (Addendum)
Anesthesia Evaluation  Patient identified by MRN, date of birth, ID band Patient awake    Reviewed: Allergy & Precautions, H&P , NPO status , Patient's Chart, lab work & pertinent test results  Airway Mallampati: II TM Distance: >3 FB Neck ROM: full    Dental No notable dental hx. (+) Teeth Intact and Dental Advisory Given   Pulmonary neg pulmonary ROS,  breath sounds clear to auscultation  Pulmonary exam normal       Cardiovascular Exercise Tolerance: Good hypertension, Pt. on medications negative cardio ROS  Rhythm:regular Rate:Normal     Neuro/Psych negative neurological ROS  negative psych ROS   GI/Hepatic negative GI ROS, Neg liver ROS, GERD-  Medicated and Controlled,  Endo/Other  negative endocrine ROS  Renal/GU negative Renal ROS  negative genitourinary   Musculoskeletal   Abdominal   Peds  Hematology negative hematology ROS (+)   Anesthesia Other Findings   Reproductive/Obstetrics negative OB ROS                          Anesthesia Physical Anesthesia Plan  ASA: II  Anesthesia Plan: General   Post-op Pain Management:    Induction: Intravenous  Airway Management Planned: Oral ETT  Additional Equipment:   Intra-op Plan:   Post-operative Plan: Extubation in OR  Informed Consent: I have reviewed the patients History and Physical, chart, labs and discussed the procedure including the risks, benefits and alternatives for the proposed anesthesia with the patient or authorized representative who has indicated his/her understanding and acceptance.   Dental Advisory Given  Plan Discussed with: CRNA and Surgeon  Anesthesia Plan Comments:         Anesthesia Quick Evaluation  

## 2011-12-17 NOTE — Anesthesia Procedure Notes (Signed)
Procedure Name: Intubation Date/Time: 12/17/2011 4:30 PM Performed by: Edison Pace Pre-anesthesia Checklist: Patient identified, Timeout performed, Emergency Drugs available, Suction available and Patient being monitored Patient Re-evaluated:Patient Re-evaluated prior to inductionOxygen Delivery Method: Circle system utilized Preoxygenation: Pre-oxygenation with 100% oxygen Intubation Type: IV induction Ventilation: Mask ventilation without difficulty Grade View: Grade II Tube type: Oral Tube size: 7.5 mm Number of attempts: 2 Airway Equipment and Method: Stylet Placement Confirmation: ETT inserted through vocal cords under direct vision,  breath sounds checked- equal and bilateral and positive ETCO2 Secured at: 21 cm Tube secured with: Tape Dental Injury: Teeth and Oropharynx as per pre-operative assessment  Difficulty Due To: Difficulty was unanticipated Future Recommendations: Recommend- induction with short-acting agent, and alternative techniques readily available

## 2011-12-17 NOTE — Plan of Care (Signed)
Problem: Diagnosis - Type of Surgery Goal: General Surgical Patient Education (See Patient Education module for education specifics) Outcome: Completed/Met Date Met:  12/17/11 L4-L5 rt discectomy

## 2011-12-17 NOTE — Progress Notes (Signed)
Patient in bed with wife at bedside. Wedding band and eyeglasses given to wife. Consent signed now awaiting transport to surgery.

## 2011-12-17 NOTE — Plan of Care (Signed)
Problem: Consults Goal: General Medical Patient Education See Patient Education Module for specific education. Outcome: Progressing Back pain due to extruted disc

## 2011-12-17 NOTE — H&P (Signed)
No change in clinical exam H+P reviewed  

## 2011-12-17 NOTE — H&P (Signed)
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  HISTORY:  This is a very pleasant gentleman who has had 1-week history of severe unrelenting leg pain.  He has been unable to sleep as he has been having horrific pain.  He was seen by my partner, Dr. Rennis Chris in acute care clinic, and concern over the disk herniation was raised. There was an attempt to get an outpatient MRI; however, because of severe pain, he could not lay still for.  As a result, he was admitted tonight for sedation for the MRI.  That MRI demonstrated a large far lateral disk herniation at L4-5 on the right side.  As a result of the finding, I was asked to evaluate the treatment for ongoing definitive management.  The patient states that the pain is horrific.  He has been unable to sleep.  He cannot walk because of the severe pain.  His past medical, surgical, family, social history is significant for hypertension, reflux disease, kidney stones, and gout.  Only surgery has been tonsillectomy and lithotripsy.  He has no cardiac history.  He does not use tobacco.  He does not use alcohol.  No known drug allergies.  The patient has been taking Tylenol, aspirin, Valium, diclofenac, Dilaudid and Percocet.  Other medications are outlined in the PA note, I have reviewed it.  PHYSICAL EXAMINATION:  GENERAL:  He is a pleasant gentleman who appears his stated age, in no acute severe distress.  The patient has no significant back pain with direct palpation.  No obvious skin lesions, abrasions, or contusions.  No shortness of breath or chest pain. ABDOMEN:  Soft and nontender.  He denies loss of bowel and  bladder control.  EXTREMITIES:  He has got absent right patellar reflex, 2+ on the left.  He has 5/5 EHL, tibialis anterior, gastrocnemius strength. His hip flexors and quad strength are both 5/5 bilaterally.  He has compartments that are soft and nontender.  Intact peripheral pulses bilaterally.  He has a positive femoral stretch sign on the right side. He has increased sensation in the L4 distribution on the right side as well as sharp radicular pain in the L4 distribution on the right side.  His MRI demonstrates a large posterior lateral disk herniation at L4-5. It does enter into the foramen and exit.  He has a large extruded extraforaminal disk herniation on the right side, causing severe compression of the exiting L4 nerve root.  The right L5 nerve root as it traverses is unaffected at the L4-5 disk level and at the L5 foramen. There is no evidence of infection.  There is some slight degenerative changes at L3-4 and a tiny central protrusion at L5-S1.  Neither appeared to be neural compressive lesions.  At this point in time, the patient has L4 right radiculopathy.  He  has significant L4 radicular pain, as well as sensory changes.  I have had a long discussion with the patient, his wife and his daughter.  At this point, we discussed both nonoperative and operative management of the disk herniation.  Given the severe pain that he has been in, he would like to proceed with surgery.  The risks of that surgery include infection, bleeding, nerve damage, death, stroke, paralysis, failure to heal, need for further surgery, ongoing or worse pain, injury to the dorsal root ganglion causing a complex regional pain syndrome, the need for further surgery, recurrent disk herniation, infection, nerve damage, leak of spinal fluid, and need for fusion surgery in the future.  All the questions were encouraged and addressed.  I did use illustrations to demonstrate the surgical procedure.  This  would be a Wiltse approach for the far lateral disk herniation.  All of their questions again were encouraged and addressed.  We will plan on surgery tomorrow afternoon.     Alvy Beal, MD     DDB/MEDQ  D:  12/16/2011  T:  12/17/2011  Job:  [id number]

## 2011-12-17 NOTE — Brief Op Note (Signed)
12/16/2011 - 12/17/2011  6:05 PM  PATIENT:  Sigmund Hazel O'Barr  66 y.o. male  PRE-OPERATIVE DIAGNOSIS:  ruptured disc L4-L5, right  POST-OPERATIVE DIAGNOSIS:  ruptured disc L4-5 right  PROCEDURE:  Procedure(s) (LRB): LUMBAR LAMINECTOMY/DECOMPRESSION MICRODISCECTOMY (Right)  SURGEON:  Surgeon(s) and Role:    * Venita Lick, MD - Primary  PHYSICIAN ASSISTANT:   ASSISTANTS: none   ANESTHESIA:   general  EBL:  Total I/O In: 1000 [I.V.:1000] Out: 75 [Blood:75]  BLOOD ADMINISTERED:none  DRAINS: none   LOCAL MEDICATIONS USED:  MARCAINE     SPECIMEN:  No Specimen  DISPOSITION OF SPECIMEN:  N/A  COUNTS:  YES  TOURNIQUET:  * No tourniquets in log *  DICTATION: .Other Dictation: Dictation Number [id number]  PLAN OF CARE: Admit to inpatient   PATIENT DISPOSITION:  PACU - hemodynamically stable.

## 2011-12-18 MED ORDER — METHOCARBAMOL 500 MG PO TABS: 500.0000 mg | ORAL_TABLET | Freq: Three times a day (TID) | ORAL | Status: AC

## 2011-12-18 MED ORDER — POLYETHYLENE GLYCOL 3350 17 G PO PACK: 17.0000 g | PACK | Freq: Every day | ORAL | Status: AC

## 2011-12-18 MED ORDER — OXYCODONE-ACETAMINOPHEN 10-325 MG PO TABS: 1.0000 | ORAL_TABLET | Freq: Four times a day (QID) | ORAL | Status: AC | PRN

## 2011-12-18 MED ORDER — ONDANSETRON HCL 4 MG PO TABS: 4.0000 mg | ORAL_TABLET | Freq: Three times a day (TID) | ORAL | Status: AC | PRN

## 2011-12-18 NOTE — Op Note (Signed)
NAME:  Randall Lopez, Randall Lopez NO.:  [id number]  MEDICAL RECORD NO.:  [id number]  LOCATION:  1608                         FACILITY:  [facility]  PHYSICIAN:  Alvy Beal, MD    DATE OF BIRTH:  11-30-1945  DATE OF PROCEDURE:  12/17/2011 DATE OF DISCHARGE:  12/18/2011                              OPERATIVE REPORT   PREOPERATIVE DIAGNOSIS:  Far lateral lumbar disk herniation, L4-5.  OPERATIVE PROCEDURE:  Far lateral lumbar diskectomy (Wiltse approach).  COMPLICATIONS:  None.  CONDITION:  Stable.  HISTORY:  This is a very pleasant gentleman who has been having a 1-week history of severe radicular right leg pain.  Because of the failure to thrive and have his pain controlled, he was ultimately admitted to the hospital yesterday.  An MRI demonstrated a far lateral disk herniation at L4-5.  As a result of the foraminal disk herniation with extraforaminal extension, I was consulted to evaluate and treat the patient.  All appropriate risks, benefits, and alternatives of surgery were discussed with the patient.  Consent was obtained for the aforementioned procedure.  OPERATIVE NOTE:  The patient was brought to the operating room, placed supine on the operating table.  After successful induction of general anesthesia and trach intubation, TEDs, SCDs, and Foley were inserted. The patient was turned prone onto a Wilson frame.  All bony prominences well padded.  The back was prepped and draped in a standard fashion. Time-out was then done to confirm patient, procedure, affected extremity, and all other pertinent important data.  Once this was completed, I used x-ray to identify the L4 pedicle.  Once I had the proper position for the L4 pedicle, I made a small 1 inch incision centered over the L4 pedicle.  This incision was centered about 1 fingerbreadth to the right of midline.  I then used the trocar sequentially to dissect through the paraspinal muscles to expose the lateral  aspect of the spine.  I docked the trocar right at the L4 pars just inferior to the L4 pedicle.  I then sequentially dilated and placed a final working portal down.  I then placed a nerve hook into the L4 and L5, and took an x-ray to confirm that I was at the appropriate level. Once this was done, I resected a small lateral portion of the bony pars to expose the L4 nerve root.  Once I identified the L4 nerve root, I then dissected inferiorly to that and identified the disk space.  I then sequentially swept around until I delivered a very large fragment of disk material.  I removed 3 large fragments of disk material from the foramen and extraforaminal region.  Once the major disk fragments were out, I then used a 15 blade scalpel to incise the posterior lateral aspect of the disk space, and I used a micropituitary rongeur and micro nerve hook to gently dissect into the disk space to ensure there was no other free fragments of disk awaiting to be expelled.  At this point, I had completed the lumbar diskectomy.  I then took a Public house manager and passed out along the extraforaminal region and then medially until I pass the medial border  of the L4 pedicle.  Once I could feel the medial border of the L4 pedicle, I then swept from superior to inferior across the disk space, and then backout.  At this point, there was no further neurocompressive structure.  Satisfied with the quantity of disk material resected from herniation as it was significant and consistent with what was seen on MRI, I irrigated the wound copiously with normal saline.  I then obtained hemostasis using bipolar electrocautery, placed a thrombin-soaked Gelfoam patty over the exposed nerve root.  I then irrigated copiously with normal saline.  I then closed the deep fascia with interrupted 0 Vicryl suture, and a 2-0 Vicryl suture, and 3-0 Monocryl for the skin.  Steri-Strips, dry dressing were applied.  The patient was  extubated, transferred to the PACU without incident.  At the end of the case, all needle, sponge counts were correct.  There was no adverse intraoperative events.     Alvy Beal, MD     DDB/MEDQ  D:  12/17/2011  T:  12/18/2011  Job:  [id number]

## 2011-12-18 NOTE — Discharge Summary (Signed)
  PATIENT ID:      Randall Lopez  MRN:     [id number] DOB/AGE:    1946-06-14 / 66 y.o.     DISCHARGE SUMMARY  ADMISSION DATE:    12/16/2011 DISCHARGE DATE:   12/18/2011   ADMISSION DIAGNOSIS: BACK PAIN ruptured disc L4-L5, right  (ruptured disc L4-L5, right)  DISCHARGE DIAGNOSIS:  ruptured disc L4-L5, right    ADDITIONAL DIAGNOSIS: Active Problems:  * No active hospital problems. *   Past Medical History  Diagnosis Date  . Hypertension   . GERD (gastroesophageal reflux disease)   . Kidney stones   . Gout     PROCEDURE: Procedure(s): LUMBAR LAMINECTOMY/DECOMPRESSION MICRODISCECTOMY on 12/16/2011 - 12/17/2011     HISTORY: See H&P in chart  HOSPITAL COURSE:  Randall Lopez is a 66 y.o. admitted on 12/16/2011 and found to have a diagnosis of ruptured disc L4-L5, right.  After appropriate laboratory studies were obtained  they were taken to the operating room on 12/16/2011 - 12/17/2011 and underwent Procedure(s): LUMBAR LAMINECTOMY/DECOMPRESSION MICRODISCECTOMY.   They were given perioperative antibiotics:  Anti-infectives     Start     Dose/Rate Route Frequency Ordered Stop   12/17/11 2359   ceFAZolin (ANCEF) IVPB 1 g/50 mL premix        1 g 100 mL/hr over 30 Minutes Intravenous Every 8 hours 12/17/11 1940 12/18/11 1559        .   Pt was doing well am after surgery no leg pain when up with PT The remainder of the hospital course was dedicated to ambulation and strengthening.   The patient was discharged on 1 Day Post-Op in  Good condition.

## 2011-12-18 NOTE — Evaluation (Signed)
Physical Therapy Evaluation Patient Details Name: Randall Lopez MRN: [id number] DOB: Mar 23, 1946 Today's Date: 12/18/2011 Time: 4098-1191 PT Time Calculation (min): 31 min  PT Assessment / Plan / Recommendation Clinical Impression  66 yo male s/p lumbar decompression who is doing well with mobility with RW .  He should improve gradually with increased activity at home and does not need HHPT.  Pt knows to ask for outpatient PT in the future for continued core strenthening if he would like. Pt will benefit from use of RW intially for stability in gait    PT Assessment  Patent does not need any further PT services    Follow Up Recommendations  No PT follow up    Barriers to Discharge        lEquipment Recommendations  Rolling walker with 5" wheels    Recommendations for Other Services     Frequency      Precautions / Restrictions Precautions Precautions: Back Precaution Booklet Issued: No Precaution Comments: pt able to aknowledge no bending, arching, twisting Required Braces or Orthoses: Spinal Brace Spinal Brace: Lumbar corset (abd binder ordered; PA okayed using this until corset arrive) Restrictions Weight Bearing Restrictions: No   Pertinent Vitals/Pain No c/o pain      Mobility  Bed Mobility Bed Mobility: Not assessed (pt wanted to stay up in chair until lunch comes) Rolling Left: 4: Min assist Left Sidelying to Sit: 4: Min assist;HOB flat Sitting - Scoot to Edge of Bed: 7: Independent Details for Bed Mobility Assistance: VC for sequencing and placement of hands through side to sit so as to avoid twisting.  Transfers Transfers: Sit to Stand;Stand to Sit Sit to Stand: From chair/3-in-1;6: Modified independent (Device/Increase time);With armrests (chair with armrests) Stand to Sit: With armrests;To chair/3-in-1;6: Modified independent (Device/Increase time) Details for Transfer Assistance: VC for hand placement and to square up with surface he is to sit  on Ambulation/Gait Ambulation/Gait Assistance: 6: Modified independent (Device/Increase time) Ambulation Distance (Feet): 200 Feet Assistive device: Rolling walker Ambulation/Gait Assistance Details: pt benefits from support of RW for stability Gait Pattern: Step-through pattern Gait velocity: decreased General Gait Details: RW adjusted so that pt can stand erect.  He moves slowly and appears to be a little "foggy"  Stairs: Yes Stairs Assistance: 5: Supervision Stairs Assistance Details (indicate cue type and reason): instructed go up sideways and lead with strong leg. Pt chooses to lead with left leg.  Wife also instructed Stair Management Technique: Sideways;One rail Right Number of Stairs: 1  Wheelchair Mobility Wheelchair Mobility: No    Exercises Other Exercises Other Exercises: adb sets and glute sets     PT Goals    Visit Information  Last PT Received On: 12/18/11 Assistance Needed: +1    Subjective Data  Subjective: My head is stil not right.Marland KitchenMarland KitchenI've had so many medications Patient Stated Goal: to go home today   Prior Functioning  Home Living Lives With: Spouse Available Help at Discharge: Family;Available 24 hours/day Type of Home: House Home Access: Stairs to enter [employer] of Steps: 1 Entrance Stairs-Rails: None Home Layout: One level Bathroom Shower/Tub: Tub/shower unit;Door [employer] Toilet: Standard Home Adaptive Equipment: Crutches (wife reports RW will be delivered today) Prior Function Level of Independence: Independent Able to Take Stairs?: Reciprically Driving: Yes Comments: buy and sells antique tractors Communication Communication: No difficulties Dominant Hand: Right    Cognition  Overall Cognitive Status: Appears within functional limits for tasks assessed/performed Arousal/Alertness: Awake/alert Orientation Level: Appears intact for tasks assessed Behavior During  Session: [facility] for tasks performed    Extremity/Trunk  Assessment Right Upper Extremity Assessment RUE ROM/Strength/Tone: Within functional levels RUE Sensation: WFL - Light Touch RUE Coordination: WFL - gross/fine motor Left Upper Extremity Assessment LUE ROM/Strength/Tone: Within functional levels LUE Sensation: WFL - Light Touch LUE Coordination: WFL - gross/fine motor Right Lower Extremity Assessment RLE ROM/Strength/Tone: Within functional levels Left Lower Extremity Assessment LLE ROM/Strength/Tone: Within functional levels Trunk Assessment Trunk Assessment: Normal (pt with abdominal binder)   Balance Balance Balance Assessed: No  End of Session PT - End of Session Activity Tolerance: Patient limited by fatigue Patient left: in chair;with family/visitor present   Donnetta Hail 12/18/2011, 10:48 AM

## 2011-12-18 NOTE — Progress Notes (Signed)
Cm spoke with pt concerning dc planning. No Md orders for [facility]. No recommendations for [facility] from Pt/OT. Pt states no HH need. Pt request RW. DME delivery scheduled by [facility] to residence upon d/c.   Leonie Green [phone]

## 2011-12-18 NOTE — Discharge Instructions (Signed)
Lumbar Laminectomy Care After You have had a laminectomy (entire lamina removed) as a treatment for your back problem. This procedure involves removal of bone to relieve pressure on nerve roots. The time spent in surgery depends on the findings found in surgery and what is necessary to correct the problems. HOME CARE INSTRUCTIONS   Check the cut (incision) made by the surgeon twice a day for signs of infection. Some signs may include a foul smelling, greenish or yellowish discharge from the wound, increased pain, or increased redness over the operative (incision) site. There may also be an opening of the incision, flu-like symptoms, or a temperature above 101.5 F (38.6 C).   Change your bandages in about 24 to 36 hours following surgery, or as directed.   You may shower once the bandage is removed, or as directed. Avoid bathtubs, swimming pools, and hot tubs for three weeks or until your incision has healed completely. If you have stitches or staples, they may be removed 2 to 3 weeks after surgery, or as directed by your doctor.   Follow your doctor's instructions as to safe activities, exercises, and physical therapy.   Weight reduction may be helpful if you are overweight.   Daily exercise is helpful to prevent the return of problems. Walking is permitted. You may use a treadmill without an incline. Cut down on activities and exercise if you have discomfort. You may also go up and down stairs as much as you can tolerate.   DO NOT lift anything heavier than 10 to 15 lbs. Avoid bending or twisting at the waist. Always bend your knees.   Maintain strength and range of motion as instructed.   Do not drive for 2 to 3 weeks, or as directed by your doctor. You may be a passenger for 20 to 30 minute trips. Laying back in the passenger seat may be more comfortable for you.   Limit your sitting to 20 to 30 minute intervals. You should lie down or walk in between sitting periods. There are no  limitations for sitting in a recliner chair.   Only take over-the-counter or prescription medicines for pain, discomfort, or fever as directed by your caregiver.  SEEK MEDICAL CARE IF:   There is increased bleeding (more than a small spot) from the wound.   You notice redness, swelling, or increasing pain in the wound.   Pus is coming from wound.   An unexplained oral temperature above 102 F (38.9 C) develops.   You notice a foul smell coming from the wound or dressing.   You have increasing pain in your wound.  SEEK IMMEDIATE MEDICAL CARE IF:   You develop a rash.   You have difficulty breathing.   You have any allergic problems.  Document Released: 06/22/2004 Document Revised: 07/08/2011 Document Reviewed: 07/19/2005 [facility] Patient Information 2012 [place], [place].

## 2011-12-18 NOTE — Anesthesia Postprocedure Evaluation (Signed)
  Anesthesia Post-op Note  Patient: Randall Lopez  Procedure(s) Performed: Procedure(s) (LRB): LUMBAR LAMINECTOMY/DECOMPRESSION MICRODISCECTOMY (Right)  Patient Location: PACU  Anesthesia Type: General  Level of Consciousness: awake and alert   Airway and Oxygen Therapy: Patient Spontanous Breathing  Post-op Pain: mild  Post-op Assessment: Post-op Vital signs reviewed, Patient's Cardiovascular Status Stable, Respiratory Function Stable, Patent Airway and No signs of Nausea or vomiting  Post-op Vital Signs: stable  Complications: No apparent anesthesia complications

## 2011-12-18 NOTE — Evaluation (Signed)
Occupational Therapy Evaluation Patient Details Name: Randall Lopez MRN: [id number] DOB: Feb 09, 1946 Today's Date: 12/18/2011 Time: 4098-1191 OT Time Calculation (min): 37 min  OT Assessment / Plan / Recommendation Clinical Impression  Pt s/p laminectomy and decompression. Doing well on POD#1. Pt is to d/c later today therefore will not continue to see acutely. Do not feel any post-acute HHOT needed at this time. No DME recommendations from OT. Will sign off.     OT Assessment  Patient does not need any further OT services    Follow Up Recommendations  No OT follow up;Supervision/Assistance - 24 hour    Barriers to Discharge      Equipment Recommendations  None recommended by OT    Recommendations for Other Services    Frequency       Precautions / Restrictions Precautions Precautions: Back Precaution Booklet Issued: No Required Braces or Orthoses: Spinal Brace Spinal Brace: Lumbar corset (abd binder ordered; PA okayed using this until corset arrive) Restrictions Weight Bearing Restrictions: No   Pertinent Vitals/Pain Pt c/o soreness but not pain noted.    ADL  Eating/Feeding: Simulated;Independent Where Assessed - Eating/Feeding: Chair Grooming: Simulated;Set up;Supervision/safety Where Assessed - Grooming: Unsupported standing Upper Body Bathing: Simulated;Minimal assistance Where Assessed - Upper Body Bathing: Supported standing Lower Body Bathing: Simulated;Minimal assistance Where Assessed - Lower Body Bathing: Supported standing Upper Body Dressing: Performed;Supervision/safety;Set up Where Assessed - Upper Body Dressing: Unsupported sitting Lower Body Dressing: Performed;Minimal assistance Where Assessed - Lower Body Dressing: Unsupported sitting;Supported standing Toilet Transfer: Simulated;Min guard (EOB to chair; to/from chair) Toilet Transfer Method: Sit to stand (VC for hand placement) Toileting - Clothing Manipulation and Hygiene: Performed;Minimal  assistance Where Assessed - Engineer, mining and Hygiene: Standing Tub/Shower Transfer:  (NT; educated pt on safe tub/shower transfer) Equipment Used: Rolling walker (abdominal binder) Transfers/Ambulation Related to ADLs: Pt Min guard A/supervision with RW ambulation throughout room. ADL Comments: Pt doing on POD day #1. Wife in room for all education.     OT Diagnosis:    OT Problem List:   OT Treatment Interventions:     OT Goals   Visit Information  Last OT Received On: 12/18/11 Assistance Needed: +1    Subjective Data  Subjective: I'm just glad you got me out of bed Patient Stated Goal: Return home; return to work on tractors   Prior Functioning  Home Living Lives With: Spouse Available Help at Discharge: Family;Available 24 hours/day Type of Home: House Home Access: Stairs to enter [employer] of Steps: 1 Entrance Stairs-Rails: None Home Layout: One level Bathroom Shower/Tub: Tub/shower unit;Door [employer] Toilet: Standard Home Adaptive Equipment: Crutches Prior Function Level of Independence: Independent Able to Take Stairs?: Reciprically Driving: Yes Comments: buy and sells antique tractors Communication Communication: No difficulties Dominant Hand: Right    Cognition  Overall Cognitive Status: Appears within functional limits for tasks assessed/performed Arousal/Alertness: Awake/alert Orientation Level: Appears intact for tasks assessed Behavior During Session: [facility] for tasks performed    Extremity/Trunk Assessment Right Upper Extremity Assessment RUE ROM/Strength/Tone: Within functional levels RUE Sensation: WFL - Light Touch RUE Coordination: WFL - gross/fine motor Left Upper Extremity Assessment LUE ROM/Strength/Tone: Within functional levels LUE Sensation: WFL - Light Touch LUE Coordination: WFL - gross/fine motor   Mobility Bed Mobility Bed Mobility: Rolling Left;Left Sidelying to Sit;Sitting - Scoot to Edge of  Bed Rolling Left: 4: Min assist Left Sidelying to Sit: 4: Min assist;HOB flat Sitting - Scoot to Edge of Bed: 7: Independent Details for Bed Mobility Assistance: VC  for sequencing and placement of hands through side to sit so as to avoid twisting.  Transfers Transfers: Sit to Stand;Stand to Sit Sit to Stand: 4: Min guard;From bed;From chair/3-in-1 (chair with armrests) Stand to Sit: 5: Supervision;With armrests;To chair/3-in-1 Details for Transfer Assistance: VC for hand placement and to square up with surface he is to sit on           End of Session OT - End of Session Equipment Utilized During Treatment: Gait belt;Back brace Activity Tolerance: Patient tolerated treatment well Patient left: in chair;with call bell/phone within reach;with family/visitor present Nurse Communication: Mobility status   Gustavus Haskin 12/18/2011, 8:57 AM

## 2011-12-20 ENCOUNTER — Encounter (HOSPITAL_COMMUNITY): Payer: Self-pay | Admitting: Orthopedic Surgery

## 2011-12-20 MED FILL — Cisatracurium Besylate (PF) IV Soln 10 MG/5ML (2 MG/ML): INTRAVENOUS | Qty: 5 | Status: AC

## 2011-12-20 NOTE — Progress Notes (Signed)
Discharge summary sent to payer through [facility]  

## 2011-12-22 NOTE — ED Provider Notes (Signed)
Chief complaint - Back pain History/physical exam/procedure(s) were performed by non-physician practitioner and as supervising physician I was immediately available for consultation/collaboration. I have reviewed all notes and am in agreement with care and plan.   Hilario Quarry, MD 12/22/11 [phone]

## 2012-05-02 ENCOUNTER — Telehealth (INDEPENDENT_AMBULATORY_CARE_PROVIDER_SITE_OTHER): Payer: Self-pay | Admitting: General Surgery

## 2012-05-02 ENCOUNTER — Ambulatory Visit (INDEPENDENT_AMBULATORY_CARE_PROVIDER_SITE_OTHER): Payer: Medicare Other | Admitting: Surgery

## 2012-05-02 ENCOUNTER — Encounter (INDEPENDENT_AMBULATORY_CARE_PROVIDER_SITE_OTHER): Payer: Self-pay | Admitting: Surgery

## 2012-05-02 VITALS — BP 138/80 | HR 80 | Temp 98.0°F | Resp 18 | Ht 70.0 in | Wt 192.0 lb

## 2012-05-02 DIAGNOSIS — K219 Gastro-esophageal reflux disease without esophagitis: Secondary | ICD-10-CM

## 2012-05-02 DIAGNOSIS — K8010 Calculus of gallbladder with chronic cholecystitis without obstruction: Secondary | ICD-10-CM | POA: Insufficient documentation

## 2012-05-02 DIAGNOSIS — Z8619 Personal history of other infectious and parasitic diseases: Secondary | ICD-10-CM

## 2012-05-02 DIAGNOSIS — I10 Essential (primary) hypertension: Secondary | ICD-10-CM | POA: Insufficient documentation

## 2012-05-02 DIAGNOSIS — K8020 Calculus of gallbladder without cholecystitis without obstruction: Secondary | ICD-10-CM

## 2012-05-02 DIAGNOSIS — R141 Gas pain: Secondary | ICD-10-CM

## 2012-05-02 DIAGNOSIS — R140 Abdominal distension (gaseous): Secondary | ICD-10-CM

## 2012-05-02 NOTE — Progress Notes (Signed)
Subjective:     Patient ID: Randall Lopez, male   DOB: 05/19/1946, 66 y.o.   MRN: [id number]  HPI  Randall Lopez  [birth year] [id number]  Patient Care Team: Lupita Raider, MD as PCP - General (Family Medicine)  This patient is a 66 y.o.male who presents today for surgical evaluation at the request of Dr Clelia Croft.   Reason for visit: Abdominal bloating and epigastric discomfort.  Gallstone.  Patient is a pleasant male.  Comes today with his wife.  I operated on a family member.  Two months ago he noticed he was getting bloating at night.  Now it happens every night.  He does not think it's necessary after eating.  He says he's always had a sensitive stomach for most of his life.  Recalls getting an EGD and H. Pylori diagnosis years ago.  Was treated by Dr. Danise Edge.  Has not had a recurrent scope.  Has had colonoscopies.  Last one done two years ago that was normal as far as he can recall.  He struggles with reflux with bile taste.  Often has sour taste comes up his mouth.  No dysphagia to solids or liquids.  Not as severe bending over or lying down flat.  He's been on chronic proton pump inhibitor for years.  Prilosec for most of that time.  With these new complaints, his primary care physician switched him over to Nexium, added Reglan and Align probiotics.  He's been on that, compliant for a month.  No improvement in symptoms.  Patient notes that fried fish will give him nausea and vomiting but that has been there for a while.  He tries to stay away from fatty greasy foods.  He tries to stay away from spicy foods as well.  Normally can walk 20 minutes without much difficulty.  Had to have back surgery so his ambulation has been slow to fully recover.  No exertional chest pain.  He does not smoke.  No rectal bleeding.  Bowel movements every one to two days.  That has not changed.  He almost feels like he had an episode of gastroenteritis one time.  But this is not like that.  No  elevated liver function tests.  An ultrasound showed a gallstone.  We'll send for GI and surgery evaluations.  They wish to proceed with the surgery evaluation first.  Patient Active Problem List  Diagnosis  . GERD (gastroesophageal reflux disease)  . Hypertension  . Gallstone  . Bloating  . History of Helicobacter pylori infection ~2004    Past Medical History  Diagnosis Date  . Hypertension   . GERD (gastroesophageal reflux disease)   . Kidney stones   . Gout   . History of Helicobacter pylori infection ~2004 05/02/2012    Past Surgical History  Procedure Date  . Tonsillectomy   . Lithotripsy   . Lumbar laminectomy/decompression microdiscectomy 12/17/2011    Procedure: LUMBAR LAMINECTOMY/DECOMPRESSION MICRODISCECTOMY;  Surgeon: Venita Lick, MD;  Location: [facility];  Service: Orthopedics;  Laterality: Right;  L4-L5 Microdiscectomy    History   Social History  . Marital Status: Married    Spouse Name: N/A    Number of Children: N/A  . Years of Education: N/A   Occupational History  . Not on file.   Social History Main Topics  . Smoking status: Never Smoker   . Smokeless tobacco: Never Used  . Alcohol Use: No  . Drug Use: No  . Sexually Active:    Other  Topics Concern  . Not on file   Social History Narrative  . No narrative on file    Family History  Problem Relation Age of Onset  . Diabetes Mother   . Stroke Father   . Stroke Maternal Grandmother   . Cancer Maternal Grandfather     kidney ca    Current Outpatient Prescriptions  Medication Sig Dispense Refill  . aspirin EC 81 MG tablet Take 81 mg by mouth daily.      Marland Kitchen atenolol (TENORMIN) 25 MG tablet       . folic acid (FOLVITE) 400 MCG tablet Take 400 mcg by mouth daily.      . indapamide (LOZOL) 1.25 MG tablet Take 1.25 mg by mouth every morning.      Marland Kitchen omega-3 acid ethyl esters (LOVAZA) 1 G capsule Take 2 g by mouth 2 (two) times daily.      Marland Kitchen omeprazole (PRILOSEC) 20 MG capsule Take 20 mg by  mouth daily.      . potassium chloride SA (K-DUR,KLOR-CON) 20 MEQ tablet Take 20 mEq by mouth daily.       . diazepam (VALIUM) 5 MG tablet Take 5 mg by mouth every 6 (six) hours as needed.         No Known Allergies  BP 138/80  Pulse 80  Temp 98 F (36.7 C) (Temporal)  Resp 18  Ht 5\' 10"  (1.778 m)  Wt 192 lb (87.091 kg)  BMI 27.55 kg/m2  SpO2 100%  No results found.   Review of Systems  Constitutional: Negative for fever, chills and diaphoresis.  HENT: Negative for nosebleeds, sore throat, facial swelling, mouth sores, trouble swallowing and ear discharge.   Eyes: Negative for photophobia, discharge and visual disturbance.  Respiratory: Negative for cough, choking, chest tightness, shortness of breath, wheezing and stridor.   Cardiovascular: Negative for chest pain and palpitations.  Gastrointestinal: Positive for nausea, abdominal pain and abdominal distention. Negative for vomiting, diarrhea, constipation, blood in stool, anal bleeding and rectal pain.  Genitourinary: Negative for dysuria, urgency, frequency, hematuria, flank pain, decreased urine volume, difficulty urinating and testicular pain.  Musculoskeletal: Negative for myalgias, back pain, arthralgias and gait problem.  Skin: Negative for color change, pallor, rash and wound.  Neurological: Negative for dizziness, speech difficulty, weakness, numbness and headaches.  Hematological: Negative for adenopathy. Does not bruise/bleed easily.  Psychiatric/Behavioral: Negative for hallucinations, confusion and agitation.       Objective:   Physical Exam  Constitutional: He is oriented to person, place, and time. He appears well-developed and well-nourished. No distress.  HENT:  Head: Normocephalic.  Mouth/Throat: Oropharynx is clear and moist. No oropharyngeal exudate.  Eyes: Conjunctivae normal and EOM are normal. Pupils are equal, round, and reactive to light. No scleral icterus.  Neck: Normal range of motion. Neck  supple. No tracheal deviation present.  Cardiovascular: Normal rate, regular rhythm and intact distal pulses.   Pulmonary/Chest: Effort normal and breath sounds normal. No respiratory distress.  Abdominal: Soft. He exhibits no distension and no mass. There is no tenderness. There is no rebound and no guarding. Hernia confirmed negative in the right inguinal area and confirmed negative in the left inguinal area.  Musculoskeletal: Normal range of motion. He exhibits no tenderness.  Lymphadenopathy:    He has no cervical adenopathy.       Right: No inguinal adenopathy present.       Left: No inguinal adenopathy present.  Neurological: He is alert and oriented to person, place, and  time. No cranial nerve deficit. He exhibits normal muscle tone. Coordination normal.  Skin: Skin is warm and dry. No rash noted. He is not diaphoretic. No erythema. No pallor.  Psychiatric: He has a normal mood and affect. His behavior is normal. Judgment and thought content normal.       Assessment:     Epigastric discomfort and bloating at night.  Not necessarily postprandial.  Chronic reflux.  History of H. Pylori gastritis in distant past    Plan:     I'm not convinced that the gallstone is the source of the problem here.  Not a classic story for biliary colic.  Could be a typical story however since it's a challenge to sort out what exactly is going on.  Another possibility is has poorly controlled reflux or recurrent gastritis.  I think it would be helpful to another gastroenterology evaluation.  I strongly recommend consideration of EGD with biopsy to rule out recurrent gastritis or paraesophageal hiatal hernia or other etiology.  If that is completely normal and Dr. Laural Benes gastroenterology is underwhelmed, could consider cholecystectomy.  Rest of the differential seems less likely.   His wife seems more inclined to proceed cholecystectomy.  The patient wants to hold off on any surgery unless he has a more  strong justification.  I did discuss what chole would entail:  The anatomy & physiology of hepatobiliary & pancreatic function was discussed.  The pathophysiology of gallbladder dysfunction was discussed.  Natural history risks without surgery was discussed.   I feel the risks of no intervention will lead to serious problems that outweigh the operative risks; therefore, I recommended cholecystectomy to remove the pathology.  I explained laparoscopic techniques with possible need for an open approach.  Probable cholangiogram to evaluate the bilary tract was explained as well.    Risks such as bleeding, infection, abscess, leak, injury to other organs, need for further treatment, heart attack, death, and other risks were discussed.  I noted a good likelihood this will help address the problem.  Possibility that this will not correct all abdominal symptoms was explained.  Goals of post-operative recovery were discussed as well.  We will work to minimize complications.  An educational handout further explaining the pathology and treatment options was given as well.  Questions were answered.  The patient expresses understanding & wishes to hold off on surgery.

## 2012-05-02 NOTE — Telephone Encounter (Signed)
No answer at Dr Henriette Combs office. Will call back again tomorrow to refer patient back for possible EGD. Will call patient once appt is set. Patient can go for appt anytime, first available.

## 2012-05-02 NOTE — Patient Instructions (Signed)
See your gastroenterologist to consider repeat endoscopy to rule out Helicobacter pylori gastritis, hiatal hernia, other foregut digestive tract pathology.  If that workup is completely negative and gastroenterology along with your primary care physician, Dr. Clelia Croft, suspect biliary etiology, we may reconsider cholecystectomy.  Bloating Bloating is the feeling of fullness in your belly. You may feel as though your pants are too tight. Often the cause of bloating is overeating, retaining fluids, or having gas in your bowel. It is also caused by swallowing air and eating foods that cause gas. Irritable bowel syndrome is one of the most common causes of bloating. Constipation is also a common cause. Sometimes more serious problems can cause bloating. SYMPTOMS  Usually there is a feeling of fullness, as though your abdomen is bulged out. There may be mild discomfort.  DIAGNOSIS  Usually no particular testing is necessary for most bloating. If the condition persists and seems to become worse, your caregiver may do additional testing.  TREATMENT   There is no direct treatment for bloating.  Do not put gas into the bowel. Avoid chewing gum and sucking on candy. These tend to make you swallow air. Swallowing air can also be a nervous habit. Try to avoid this.  Avoiding high residue diets will help. Eat foods with soluble fibers (examples include root vegetables, apples, or barley) and substitute dairy products with soy and rice products. This helps irritable bowel syndrome.  If constipation is the cause, then a high residue diet with more fiber will help.  Avoid carbonated beverages.  Over-the-counter preparations are available that help reduce gas. Medicines containing simethicone such as Gas-X, Maalox, Phazyme, etc. May be helpful.  Your pharmacist can help you with this. SEEK MEDICAL CARE IF:   Bloating continues and seems to be getting worse.  You notice a weight gain.  You have a weight loss  but the bloating is getting worse.  You have changes in your bowel habits or develop nausea or vomiting. SEEK IMMEDIATE MEDICAL CARE IF:   You develop shortness of breath or swelling in your legs.  You have an increase in abdominal pain or develop chest pain. Document Released: 05/19/2006 Document Revised: 10/11/2011 Document Reviewed: 07/07/2007 [facility] Patient Information 2013 [place], [place].

## 2012-05-03 NOTE — Telephone Encounter (Signed)
LMOM for Clydie Braun, appt coordinator for Dr Laural Benes, to call me back with appt. Awaiting returned call.

## 2012-05-03 NOTE — Telephone Encounter (Signed)
Spoke with Clydie Braun who needs a new referral on patient since it has been so long since he was seen. They sent their referral sheet which was completed and faxed along with his office note from Dr Michaell Cowing and insurance information to [phone] attn: Lennox Laity. They will call patient with appt.

## 2012-05-04 ENCOUNTER — Telehealth (INDEPENDENT_AMBULATORY_CARE_PROVIDER_SITE_OTHER): Payer: Self-pay

## 2012-05-04 NOTE — Telephone Encounter (Signed)
Pt calling to see where we are in referral to Dr Danise Edge. He states Dr Michaell Cowing was going to work on referral and call him back. Pt can be reached at [phone].

## 2012-05-04 NOTE — Telephone Encounter (Signed)
LMOM for patient to call back and ask for me. Referral sent to Dr Laural Benes and their office will call patient with appt.

## 2012-05-10 ENCOUNTER — Encounter (INDEPENDENT_AMBULATORY_CARE_PROVIDER_SITE_OTHER): Payer: Self-pay

## 2012-06-08 ENCOUNTER — Other Ambulatory Visit: Payer: Self-pay | Admitting: Gastroenterology

## 2012-07-11 ENCOUNTER — Ambulatory Visit (INDEPENDENT_AMBULATORY_CARE_PROVIDER_SITE_OTHER): Payer: Medicare Other | Admitting: Surgery

## 2012-07-11 ENCOUNTER — Encounter (INDEPENDENT_AMBULATORY_CARE_PROVIDER_SITE_OTHER): Payer: Self-pay | Admitting: Surgery

## 2012-07-11 VITALS — BP 136/70 | HR 80 | Resp 16 | Ht 69.0 in | Wt 191.0 lb

## 2012-07-11 DIAGNOSIS — K8010 Calculus of gallbladder with chronic cholecystitis without obstruction: Secondary | ICD-10-CM

## 2012-07-11 NOTE — Progress Notes (Signed)
Subjective:     Patient ID: Randall Lopez, male   DOB: 04/08/1946, 66 y.o.   MRN: [id number]  Abdominal Pain Associated symptoms include nausea. Pertinent negatives include no arthralgias, constipation, diarrhea, dysuria, fever, frequency, headaches, hematuria, myalgias or vomiting.    Randall Lopez  06/24/1946 [id number]  Patient Care Team: Kimberlee Shaw, MD as PCP - General (Family Medicine) Martin K Johnson, MD as Consulting Physician (Gastroenterology)  This patient is a 66 y.o.male who presents today for surgical evaluation at the request of Dr Shaw.   Reason for visit: Persistent abdominal bloating and epigastric discomfort.  Gallstone.  Patient is a pleasant male.  Comes today with his wife.  I operated on a family member.  Two months ago he noticed he was getting bloating at night.  Now it happens every night.  He does not think it's necessary after eating.  He says he's always had a sensitive stomach for most of his life.  Recalls getting an EGD and H. Pylori diagnosis years ago.  Was treated by Dr. Martin Johnson.  His symptoms seem more consistent with reflux.  I recommended reevaluation with gastroenterology.  The patient has been compliant on an acid regimen.  He had an upper endoscopy which was underwhelming.  H. Pylori negative.  Minimal gastritis seen only microscopically.  No sprue.  The patient has had dwindling tolerance to foods.  Recalls a severe attack after eating a hotdog at a fair.  Very sharp upper bowel pain.  Occasionally radiating around his left side.  Sometimes around his right side.  Has a strict low-fat diet and still struggles with eating.  Otherwise quite physically active.  Records confirmed a gallstone from an ultrasound in August 2013.  He comes again with his wife.  Patient Active Problem List  Diagnosis  . GERD (gastroesophageal reflux disease)  . Hypertension  . Gallstone  . Bloating  . History of Helicobacter pylori infection ~2004     Past Medical History  Diagnosis Date  . Hypertension   . GERD (gastroesophageal reflux disease)   . Kidney stones   . Gout   . History of Helicobacter pylori infection ~2004 05/02/2012    Past Surgical History  Procedure Date  . Tonsillectomy   . Lithotripsy   . Lumbar laminectomy/decompression microdiscectomy 12/17/2011    Procedure: LUMBAR LAMINECTOMY/DECOMPRESSION MICRODISCECTOMY;  Surgeon: Dahari Brooks, MD;  Location: [facility];  Service: Orthopedics;  Laterality: Right;  L4-L5 Microdiscectomy    History   Social History  . Marital Status: Married    Spouse Name: N/A    Number of Children: N/A  . Years of Education: N/A   Occupational History  . Not on file.   Social History Main Topics  . Smoking status: Never Smoker   . Smokeless tobacco: Never Used  . Alcohol Use: No  . Drug Use: No  . Sexually Active:    Other Topics Concern  . Not on file   Social History Narrative  . No narrative on file    Family History  Problem Relation Age of Onset  . Diabetes Mother   . Stroke Father   . Stroke Maternal Grandmother   . Cancer Maternal Grandfather     kidney ca    Current Outpatient Prescriptions  Medication Sig Dispense Refill  . aspirin EC 81 MG tablet Take 81 mg by mouth daily.      . atenolol (TENORMIN) 25 MG tablet       . diazepam (VALIUM) 5 MG   tablet Take 5 mg by mouth every 6 (six) hours as needed.      . folic acid (FOLVITE) 400 MCG tablet Take 400 mcg by mouth daily.      . indapamide (LOZOL) 1.25 MG tablet Take 1.25 mg by mouth every morning.      . lisinopril (PRINIVIL,ZESTRIL) 10 MG tablet       . omega-3 acid ethyl esters (LOVAZA) 1 G capsule Take 2 g by mouth 2 (two) times daily.      . omeprazole (PRILOSEC) 20 MG capsule Take 20 mg by mouth daily.      . potassium chloride SA (K-DUR,KLOR-CON) 20 MEQ tablet Take 20 mEq by mouth daily.          No Known Allergies  BP 136/70  Pulse 80  Resp 16  Ht 5' 9" (1.753 m)  Wt 191 lb (86.637 kg)   BMI 28.21 kg/m2  No results found.   Review of Systems  Constitutional: Negative for fever, chills and diaphoresis.  HENT: Negative for nosebleeds, sore throat, facial swelling, mouth sores, trouble swallowing and ear discharge.   Eyes: Negative for photophobia, discharge and visual disturbance.  Respiratory: Negative for cough, choking, chest tightness, shortness of breath, wheezing and stridor.   Cardiovascular: Negative for chest pain and palpitations.  Gastrointestinal: Positive for nausea, abdominal pain and abdominal distention. Negative for vomiting, diarrhea, constipation, blood in stool, anal bleeding and rectal pain.  Genitourinary: Negative for dysuria, urgency, frequency, hematuria, flank pain, decreased urine volume, difficulty urinating and testicular pain.  Musculoskeletal: Negative for myalgias, back pain, arthralgias and gait problem.  Skin: Negative for color change, pallor, rash and wound.  Neurological: Negative for dizziness, speech difficulty, weakness, numbness and headaches.  Hematological: Negative for adenopathy. Does not bruise/bleed easily.  Psychiatric/Behavioral: Negative for hallucinations, confusion and agitation.       Objective:   Physical Exam  Constitutional: He is oriented to person, place, and time. He appears well-developed and well-nourished. No distress.  HENT:  Head: Normocephalic.  Mouth/Throat: Oropharynx is clear and moist. No oropharyngeal exudate.  Eyes: Conjunctivae normal and EOM are normal. Pupils are equal, round, and reactive to light. No scleral icterus.  Neck: Normal range of motion. Neck supple. No tracheal deviation present.  Cardiovascular: Normal rate, regular rhythm and intact distal pulses.   Pulmonary/Chest: Effort normal and breath sounds normal. No respiratory distress.  Abdominal: Soft. He exhibits no distension and no mass. There is no tenderness. There is no rebound and no guarding. Hernia confirmed negative in the  right inguinal area and confirmed negative in the left inguinal area.  Musculoskeletal: Normal range of motion. He exhibits no tenderness.  Lymphadenopathy:    He has no cervical adenopathy.       Right: No inguinal adenopathy present.       Left: No inguinal adenopathy present.  Neurological: He is alert and oriented to person, place, and time. No cranial nerve deficit. He exhibits normal muscle tone. Coordination normal.  Skin: Skin is warm and dry. No rash noted. He is not diaphoretic. No erythema. No pallor.  Psychiatric: He has a normal mood and affect. His behavior is normal. Judgment and thought content normal.       Assessment:     Worsening postprandial pain or suspicious for biliary colic.  Forgut workup by gastroenterology negative.    Plan:     I more inclined to offer cholecystectomy given the fact that his story is more consistent with biliary colic, and   gastroenterology workup was otherwise negative.  He wishes to proceed with surgery now as he is getting more miserable.  I did discuss what chole would entail:  The anatomy & physiology of hepatobiliary & pancreatic function was discussed.  The pathophysiology of gallbladder dysfunction was discussed.  Natural history risks without surgery was discussed.   I feel the risks of no intervention will lead to serious problems that outweigh the operative risks; therefore, I recommended cholecystectomy to remove the pathology.  I explained laparoscopic techniques with possible need for an open approach.  Probable cholangiogram to evaluate the bilary tract was explained as well.    Risks such as bleeding, infection, abscess, leak, injury to other organs, need for further treatment, heart attack, death, and other risks were discussed.  I noted a good likelihood this will help address the problem.  Possibility that this will not correct all abdominal symptoms was explained.  Goals of post-operative recovery were discussed as well.  We  will work to minimize complications.  An educational handout further explaining the pathology and treatment options was given as well.  Questions were answered.  The patient & his wife expressed understanding & wish to hold off on surgery.        

## 2012-07-11 NOTE — Patient Instructions (Signed)
See the Handout(s) we gave you.  Consider surgery.  Please call our office at [phone] if you wish to schedule surgery or if you have further questions / concerns.    Cholecystitis Cholecystitis is an inflammation of your gallbladder. It is usually caused by a buildup of gallstones or sludge (cholelithiasis) in your gallbladder. The gallbladder stores a fluid that helps digest fats (bile). Cholecystitis is serious and needs treatment right away.  CAUSES   Gallstones. Gallstones can block the tube that leads to your gallbladder, causing bile to build up. As bile builds up, the gallbladder becomes inflamed.  Bile duct problems, such as blockage from scarring or kinking.  Tumors. Tumors can stop bile from leaving your gallbladder correctly, causing bile to build up. As bile builds up, the gallbladder becomes inflamed. SYMPTOMS   Nausea.  Vomiting.  Abdominal pain, especially in the upper right area of your abdomen.  Abdominal tenderness or bloating.  Sweating.  Chills.  Fever.  Yellowing of the skin and the whites of the eyes (jaundice). DIAGNOSIS  Your caregiver may order blood tests to look for infection or gallbladder problems. Your caregiver may also order imaging tests, such as an ultrasound or computed tomography (CT) scan. Further tests may include a hepatobiliary iminodiacetic acid (HIDA) scan. This scan allows your caregiver to see your bile move from the liver to the gallbladder and to the small intestine. TREATMENT  A hospital stay is usually necessary to lessen the inflammation of your gallbladder. You may be required to not eat or drink (fast) for a certain amount of time. You may be given medicine to treat pain or an antibiotic medicine to treat an infection. Surgery may be needed to remove your gallbladder (cholecystectomy) once the inflammation has gone down. Surgery may be needed right away if you develop complications such as death of gallbladder tissue  (gangrene) or a tear (perforation) of the gallbladder.  HOME CARE INSTRUCTIONS  Home care will depend on your treatment. In general:  If you were given antibiotics, take them as directed. Finish them even if you start to feel better.  Only take over-the-counter or prescription medicines for pain, discomfort, or fever as directed by your caregiver.  Follow a low-fat diet until you see your caregiver again.  Keep all follow-up visits as directed by your caregiver. SEEK IMMEDIATE MEDICAL CARE IF:   Your pain is increasing and not controlled by medicines.  Your pain moves to another part of your abdomen or to your back.  You have a fever.  You have nausea and vomiting. MAKE SURE YOU:  Understand these instructions.  Will watch your condition.  Will get help right away if you are not doing well or get worse. Document Released: 07/19/2005 Document Revised: 10/11/2011 Document Reviewed: 06/04/2011 [facility] Patient Information 2013 [place], [place].  LAPAROSCOPIC SURGERY: POST OP INSTRUCTIONS  1. DIET: Follow a light bland diet the first 24 hours after arrival home, such as soup, liquids, crackers, etc.  Be sure to include lots of fluids daily.  Avoid fast food or heavy meals as your are more likely to get nauseated.  Eat a low fat the next few days after surgery.   2. Take your usually prescribed home medications unless otherwise directed. 3. PAIN CONTROL: a. Pain is best controlled by a usual combination of three different methods TOGETHER: i. Ice/Heat ii. Over the counter pain medication iii. Prescription pain medication b. Most patients will experience some swelling and bruising around the incisions.  Ice packs  or heating pads (30-60 minutes up to 6 times a day) will help. Use ice for the first few days to help decrease swelling and bruising, then switch to heat to help relax tight/sore spots and speed recovery.  Some people prefer to use ice alone, heat alone, alternating between  ice & heat.  Experiment to what works for you.  Swelling and bruising can take several weeks to resolve.   c. It is helpful to take an over-the-counter pain medication regularly for the first few weeks.  Choose one of the following that works best for you: i. Naproxen (Aleve, etc)  Two 220mg  tabs twice a day ii. Ibuprofen (Advil, etc) Three 200mg  tabs four times a day (every meal & bedtime) iii. Acetaminophen (Tylenol, etc) 500-650mg  four times a day (every meal & bedtime) d. A  prescription for pain medication (such as oxycodone, hydrocodone, etc) should be given to you upon discharge.  Take your pain medication as prescribed.  i. If you are having problems/concerns with the prescription medicine (does not control pain, nausea, vomiting, rash, itching, etc), please call us [phone] to see if we need to switch you to a different pain medicine that will work better for you and/or control your side effect better. ii. If you need a refill on your pain medication, please contact your pharmacy.  They will contact our office to request authorization. Prescriptions will not be filled after 5 pm or on week-ends. 4. Avoid getting constipated.  Between the surgery and the pain medications, it is common to experience some constipation.  Increasing fluid intake and taking a fiber supplement (such as Metamucil, Citrucel, FiberCon, MiraLax, etc) 1-2 times a day regularly will usually help prevent this problem from occurring.  A mild laxative (prune juice, Milk of Magnesia, MiraLax, etc) should be taken according to package directions if there are no bowel movements after 48 hours.   5. Watch out for diarrhea.  If you have many loose bowel movements, simplify your diet to bland foods & liquids for a few days.  Stop any stool softeners and decrease your fiber supplement.  Switching to mild anti-diarrheal medications (Kayopectate, Pepto Bismol) can help.  If this worsens or does not improve, please call us. 6. Wash /  shower every day.  You may shower over the dressings as they are waterproof.  Continue to shower over incision(s) after the dressing is off. 7. Remove your waterproof bandages 5 days after surgery.  You may leave the incision open to air.  You may replace a dressing/Band-Aid to cover the incision for comfort if you wish.  8. ACTIVITIES as tolerated:   a. You may resume regular (light) daily activities beginning the next day-such as daily self-care, walking, climbing stairs-gradually increasing activities as tolerated.  If you can walk 30 minutes without difficulty, it is safe to try more intense activity such as jogging, treadmill, bicycling, low-impact aerobics, swimming, etc. b. Save the most intensive and strenuous activity for last such as sit-ups, heavy lifting, contact sports, etc  Refrain from any heavy lifting or straining until you are off narcotics for pain control.   c. DO NOT PUSH THROUGH PAIN.  Let pain be your guide: If it hurts to do something, don't do it.  Pain is your body warning you to avoid that activity for another week until the pain goes down. d. You may drive when you are no longer taking prescription pain medication, you can comfortably wear a seatbelt, and you can safely maneuver  your car and apply brakes. e. Bonita Quin may have sexual intercourse when it is comfortable.  9. FOLLOW UP in our office a. Please call [facility] at [phone] to set up an appointment to see your surgeon in the office for a follow-up appointment approximately 2-3 weeks after your surgery. b. Make sure that you call for this appointment the day you arrive home to insure a convenient appointment time. 10. IF YOU HAVE DISABILITY OR FAMILY LEAVE FORMS, BRING THEM TO THE OFFICE FOR PROCESSING.  DO NOT GIVE THEM TO YOUR DOCTOR.   WHEN TO CALL us [phone]: 1. Poor pain control 2. Reactions / problems with new medications (rash/itching, nausea, etc)  3. Fever over 101.5 F (38.5 C) 4. Inability to  urinate 5. Nausea and/or vomiting 6. Worsening swelling or bruising 7. Continued bleeding from incision. 8. Increased pain, redness, or drainage from the incision   The clinic staff is available to answer your questions during regular business hours (8:30am-5pm).  Please don't hesitate to call and ask to speak to one of our nurses for clinical concerns.   If you have a medical emergency, go to the nearest emergency room or call 911.  A surgeon from [facility] is always on call at the [facility], [place] [address], [address], [place], [place]  [address] ? MAIN: [phone] ? TOLL FREE: [phone] ?  FAX [phone] [email or web address]

## 2012-07-27 ENCOUNTER — Encounter (HOSPITAL_COMMUNITY): Payer: Self-pay | Admitting: Pharmacy Technician

## 2012-07-28 ENCOUNTER — Encounter (HOSPITAL_COMMUNITY)
Admission: RE | Admit: 2012-07-28 | Discharge: 2012-07-28 | Disposition: A | Discharge status: Home / Self Care | Payer: Medicare Other | Source: Ambulatory Visit | Attending: Surgery | Admitting: Surgery

## 2012-07-28 ENCOUNTER — Encounter (HOSPITAL_COMMUNITY): Payer: Self-pay

## 2012-07-28 ENCOUNTER — Ambulatory Visit (HOSPITAL_COMMUNITY)
Admission: RE | Admit: 2012-07-28 | Discharge: 2012-07-28 | Disposition: A | Discharge status: Home / Self Care | Payer: Medicare Other | Source: Ambulatory Visit | Attending: Surgery | Admitting: Surgery

## 2012-07-28 DIAGNOSIS — Z01818 Encounter for other preprocedural examination: Secondary | ICD-10-CM | POA: Insufficient documentation

## 2012-07-28 LAB — BASIC METABOLIC PANEL
BUN: 15 mg/dL (ref 6–23)
CO2: 29 mEq/L (ref 19–32)
Calcium: 9.6 mg/dL (ref 8.4–10.5)
Creatinine, Ser: 1.08 mg/dL (ref 0.50–1.35)

## 2012-07-28 LAB — CBC
HCT: 45.6 % (ref 39.0–52.0)
MCH: 29.6 pg (ref 26.0–34.0)
MCV: 83.8 fL (ref 78.0–100.0)
Platelets: 236 10*3/uL (ref 150–400)
RBC: 5.44 MIL/uL (ref 4.22–5.81)

## 2012-07-28 NOTE — Patient Instructions (Signed)
20 BRAELYNN LUPTON  07/28/2012   Your procedure is scheduled on: 08/04/12  Report to [facility] at [phone].  Call this number if you have problems the morning of surgery 336-: [phone]   Remember:   Do not eat food or drink liquids After Midnight.     Take these medicines the morning of surgery with A SIP OF WATER: atenolol, prilosec   Do not wear jewelry, make-up or nail polish.  Do not wear lotions, powders, or perfumes. You may wear deodorant.  Do not shave 48 hours prior to surgery. Men may shave face and neck.  Do not bring valuables to the hospital.  Contacts, dentures or bridgework may not be worn into surgery.     Patients discharged the day of surgery will not be allowed to drive home.  Name and phone number of your driver: Chyrl Civatte [phone] (h) [phone] (c)   Please read over the following fact sheets that you were given: MRSA Information.  Birdie Sons, RN  pre op nurse call if needed [phone]    FAILURE TO FOLLOW THESE INSTRUCTIONS MAY RESULT IN CANCELLATION OF YOUR SURGERY   Patient Signature: ___________________________________________

## 2012-07-28 NOTE — Progress Notes (Signed)
EKG 12/19/11 on EPIC

## 2012-07-31 LAB — MRSA CULTURE

## 2012-08-04 ENCOUNTER — Encounter (HOSPITAL_COMMUNITY): Payer: Self-pay | Admitting: Anesthesiology

## 2012-08-04 ENCOUNTER — Ambulatory Visit (HOSPITAL_COMMUNITY): Payer: Medicare Other | Admitting: Anesthesiology

## 2012-08-04 ENCOUNTER — Encounter (HOSPITAL_COMMUNITY)
Admission: RE | Disposition: A | Discharge status: Home / Self Care | Payer: Self-pay | Source: Ambulatory Visit | Attending: Surgery

## 2012-08-04 ENCOUNTER — Encounter (HOSPITAL_COMMUNITY): Payer: Self-pay | Admitting: *Deleted

## 2012-08-04 ENCOUNTER — Ambulatory Visit (HOSPITAL_COMMUNITY): Payer: Medicare Other

## 2012-08-04 ENCOUNTER — Ambulatory Visit (HOSPITAL_COMMUNITY)
Admission: RE | Admit: 2012-08-04 | Discharge: 2012-08-04 | Disposition: A | Discharge status: Home / Self Care | Payer: Medicare Other | Source: Ambulatory Visit | Attending: Surgery | Admitting: Surgery

## 2012-08-04 DIAGNOSIS — K660 Peritoneal adhesions (postprocedural) (postinfection): Secondary | ICD-10-CM

## 2012-08-04 DIAGNOSIS — K824 Cholesterolosis of gallbladder: Secondary | ICD-10-CM

## 2012-08-04 DIAGNOSIS — K8010 Calculus of gallbladder with chronic cholecystitis without obstruction: Secondary | ICD-10-CM | POA: Insufficient documentation

## 2012-08-04 DIAGNOSIS — I10 Essential (primary) hypertension: Secondary | ICD-10-CM | POA: Insufficient documentation

## 2012-08-04 HISTORY — PX: LAPAROSCOPIC CHOLECYSTECTOMY SINGLE PORT: SHX5891

## 2012-08-04 SURGERY — LAPAROSCOPIC CHOLECYSTECTOMY SINGLE SITE
Anesthesia: General | Site: Abdomen | Wound class: Clean Contaminated

## 2012-08-04 MED ORDER — DEXAMETHASONE SODIUM PHOSPHATE 10 MG/ML IJ SOLN
INTRAMUSCULAR | Status: DC | PRN
  Administered 2012-08-04: 10 mg via INTRAVENOUS

## 2012-08-04 MED ORDER — IOHEXOL 300 MG/ML  SOLN
INTRAMUSCULAR | Status: DC | PRN
  Administered 2012-08-04: 2.5 mL

## 2012-08-04 MED ORDER — ROCURONIUM BROMIDE 100 MG/10ML IV SOLN
INTRAVENOUS | Status: DC | PRN
  Administered 2012-08-04: 35 mg via INTRAVENOUS
  Administered 2012-08-04: 5 mg via INTRAVENOUS
  Administered 2012-08-04: 10 mg via INTRAVENOUS

## 2012-08-04 MED ORDER — ACETAMINOPHEN 650 MG RE SUPP
650.0000 mg | RECTAL | Status: DC | PRN
  Filled 2012-08-04: qty 1

## 2012-08-04 MED ORDER — NEOSTIGMINE METHYLSULFATE 1 MG/ML IJ SOLN
INTRAMUSCULAR | Status: DC | PRN
  Administered 2012-08-04: 5 mg via INTRAVENOUS

## 2012-08-04 MED ORDER — BUPIVACAINE-EPINEPHRINE 0.5% -1:200000 IJ SOLN
INTRAMUSCULAR | Status: DC | PRN
  Administered 2012-08-04: 50 mL

## 2012-08-04 MED ORDER — LACTATED RINGERS IR SOLN
Status: DC | PRN
  Administered 2012-08-04: 3000 mL

## 2012-08-04 MED ORDER — FENTANYL CITRATE 0.05 MG/ML IJ SOLN
INTRAMUSCULAR | Status: AC
  Filled 2012-08-04: qty 2

## 2012-08-04 MED ORDER — ACETAMINOPHEN 10 MG/ML IV SOLN
INTRAVENOUS | Status: AC
  Filled 2012-08-04: qty 100

## 2012-08-04 MED ORDER — LIDOCAINE HCL (CARDIAC) 20 MG/ML IV SOLN
INTRAVENOUS | Status: DC | PRN
  Administered 2012-08-04: 100 mg via INTRAVENOUS

## 2012-08-04 MED ORDER — FENTANYL CITRATE 0.05 MG/ML IJ SOLN
INTRAMUSCULAR | Status: DC | PRN
  Administered 2012-08-04 (×5): 50 ug via INTRAVENOUS

## 2012-08-04 MED ORDER — IOHEXOL 300 MG/ML  SOLN
INTRAMUSCULAR | Status: AC
  Filled 2012-08-04: qty 1

## 2012-08-04 MED ORDER — OXYCODONE HCL 5 MG PO TABS: 5.0000 mg | ORAL_TABLET | ORAL | Status: DC | PRN

## 2012-08-04 MED ORDER — GLYCOPYRROLATE 0.2 MG/ML IJ SOLN
INTRAMUSCULAR | Status: DC | PRN
  Administered 2012-08-04: 0.6 mg via INTRAVENOUS

## 2012-08-04 MED ORDER — ONDANSETRON HCL 4 MG/2ML IJ SOLN: 4.0000 mg | Freq: Four times a day (QID) | INTRAMUSCULAR | Status: DC | PRN

## 2012-08-04 MED ORDER — SODIUM CHLORIDE 0.9 % IJ SOLN: 3.0000 mL | INTRAMUSCULAR | Status: DC | PRN

## 2012-08-04 MED ORDER — BUPIVACAINE-EPINEPHRINE 0.25% -1:200000 IJ SOLN
INTRAMUSCULAR | Status: AC
  Filled 2012-08-04: qty 1

## 2012-08-04 MED ORDER — SUCCINYLCHOLINE CHLORIDE 20 MG/ML IJ SOLN
INTRAMUSCULAR | Status: DC | PRN
  Administered 2012-08-04: 100 mg via INTRAVENOUS

## 2012-08-04 MED ORDER — SODIUM CHLORIDE 0.9 % IJ SOLN: 3.0000 mL | Freq: Two times a day (BID) | INTRAMUSCULAR | Status: DC

## 2012-08-04 MED ORDER — ONDANSETRON HCL 4 MG/2ML IJ SOLN
INTRAMUSCULAR | Status: DC | PRN
  Administered 2012-08-04: 4 mg via INTRAVENOUS

## 2012-08-04 MED ORDER — FENTANYL CITRATE 0.05 MG/ML IJ SOLN
25.0000 ug | INTRAMUSCULAR | Status: DC | PRN
  Administered 2012-08-04 (×2): 50 ug via INTRAVENOUS

## 2012-08-04 MED ORDER — 0.9 % SODIUM CHLORIDE (POUR BTL) OPTIME
TOPICAL | Status: DC | PRN
  Administered 2012-08-04: 2.5 mL

## 2012-08-04 MED ORDER — FENTANYL CITRATE 0.05 MG/ML IJ SOLN: 25.0000 ug | INTRAMUSCULAR | Status: DC | PRN

## 2012-08-04 MED ORDER — PROPOFOL 10 MG/ML IV BOLUS
INTRAVENOUS | Status: DC | PRN
  Administered 2012-08-04: 130 mg via INTRAVENOUS

## 2012-08-04 MED ORDER — PROMETHAZINE HCL 25 MG/ML IJ SOLN: 6.2500 mg | INTRAMUSCULAR | Status: DC | PRN

## 2012-08-04 MED ORDER — MIDAZOLAM HCL 5 MG/5ML IJ SOLN
INTRAMUSCULAR | Status: DC | PRN
  Administered 2012-08-04: 2 mg via INTRAVENOUS

## 2012-08-04 MED ORDER — IBUPROFEN 200 MG PO TABS: 600.0000 mg | ORAL_TABLET | Freq: Four times a day (QID) | ORAL | Status: DC | PRN

## 2012-08-04 MED ORDER — KETOROLAC TROMETHAMINE 30 MG/ML IJ SOLN
15.0000 mg | Freq: Once | INTRAMUSCULAR | Status: AC | PRN
  Administered 2012-08-04: 30 mg via INTRAVENOUS
  Filled 2012-08-04: qty 1

## 2012-08-04 MED ORDER — KETOROLAC TROMETHAMINE 30 MG/ML IJ SOLN
INTRAMUSCULAR | Status: DC | PRN
  Administered 2012-08-04: 30 mg via INTRAVENOUS

## 2012-08-04 MED ORDER — SODIUM CHLORIDE 0.9 % IV SOLN: 250.0000 mL | INTRAVENOUS | Status: DC | PRN

## 2012-08-04 MED ORDER — LACTATED RINGERS IV SOLN
INTRAVENOUS | Status: DC
  Administered 2012-08-04: 1000 mL via INTRAVENOUS

## 2012-08-04 MED ORDER — ACETAMINOPHEN 325 MG PO TABS: 650.0000 mg | ORAL_TABLET | ORAL | Status: DC | PRN

## 2012-08-04 MED ORDER — ACETAMINOPHEN 10 MG/ML IV SOLN
INTRAVENOUS | Status: DC | PRN
  Administered 2012-08-04: 1000 mg via INTRAVENOUS

## 2012-08-04 SURGICAL SUPPLY — 45 items
APPLIER CLIP ROT 10 11.4 M/L (STAPLE)
APPLIER CLIP UNV 5X34 EPIX (ENDOMECHANICALS) ×2 IMPLANT
CABLE HIGH FREQUENCY MONO STRZ (ELECTRODE) ×2 IMPLANT
CANISTER SUCTION 2500CC (MISCELLANEOUS) ×2 IMPLANT
CLIP APPLIE ROT 10 11.4 M/L (STAPLE) IMPLANT
CLOTH BEACON ORANGE TIMEOUT ST (SAFETY) ×2 IMPLANT
COVER MAYO STAND STRL (DRAPES) ×2 IMPLANT
DECANTER SPIKE VIAL GLASS SM (MISCELLANEOUS) ×2 IMPLANT
DRAIN CHANNEL 19F RND (DRAIN) IMPLANT
DRAPE C-ARM 42X72 X-RAY (DRAPES) ×2 IMPLANT
DRAPE LAPAROSCOPIC ABDOMINAL (DRAPES) ×2 IMPLANT
DRAPE WARM FLUID 44X44 (DRAPE) ×2 IMPLANT
DRSG TEGADERM 4X4.75 (GAUZE/BANDAGES/DRESSINGS) ×2 IMPLANT
ELECT REM PT RETURN 9FT ADLT (ELECTROSURGICAL) ×2
ELECTRODE REM PT RTRN 9FT ADLT (ELECTROSURGICAL) ×1 IMPLANT
ENDOLOOP SUT PDS II  0 18 (SUTURE) ×1
ENDOLOOP SUT PDS II 0 18 (SUTURE) ×1 IMPLANT
EVACUATOR SILICONE 100CC (DRAIN) IMPLANT
GAUZE SPONGE 2X2 8PLY STRL LF (GAUZE/BANDAGES/DRESSINGS) ×1 IMPLANT
GLOVE BIOGEL PI IND STRL 7.0 (GLOVE) ×1 IMPLANT
GLOVE BIOGEL PI INDICATOR 7.0 (GLOVE) ×1
GLOVE ECLIPSE 8.0 STRL XLNG CF (GLOVE) ×2 IMPLANT
GLOVE INDICATOR 8.0 STRL GRN (GLOVE) ×2 IMPLANT
GOWN STRL NON-REIN LRG LVL3 (GOWN DISPOSABLE) IMPLANT
GOWN STRL REIN XL XLG (GOWN DISPOSABLE) ×4 IMPLANT
IV LACTATED RINGER IRRG 3000ML (IV SOLUTION) ×1
IV LR IRRIG 3000ML ARTHROMATIC (IV SOLUTION) ×1 IMPLANT
KIT BASIN OR (CUSTOM PROCEDURE TRAY) ×2 IMPLANT
NS IRRIG 1000ML POUR BTL (IV SOLUTION) ×2 IMPLANT
POUCH SPECIMEN RETRIEVAL 10MM (ENDOMECHANICALS) ×2 IMPLANT
SCALPEL HARMONIC ACE (MISCELLANEOUS) ×2 IMPLANT
SCISSORS LAP 5X35 DISP (ENDOMECHANICALS) IMPLANT
SET CHOLANGIOGRAPH MIX (MISCELLANEOUS) ×2 IMPLANT
SET IRRIG TUBING LAPAROSCOPIC (IRRIGATION / IRRIGATOR) ×2 IMPLANT
SOLUTION ANTI FOG 6CC (MISCELLANEOUS) IMPLANT
SPONGE GAUZE 2X2 STER 10/PKG (GAUZE/BANDAGES/DRESSINGS) ×1
STRIP CLOSURE SKIN 1/2X4 (GAUZE/BANDAGES/DRESSINGS) IMPLANT
SUT MNCRL AB 4-0 PS2 18 (SUTURE) ×2 IMPLANT
SUT VICRYL 0 TIES 12 18 (SUTURE) IMPLANT
TOWEL OR 17X26 10 PK STRL BLUE (TOWEL DISPOSABLE) ×2 IMPLANT
TRAY LAP CHOLE (CUSTOM PROCEDURE TRAY) ×2 IMPLANT
TROCAR 5M 150ML BLDLS (TROCAR) ×2 IMPLANT
TROCAR Z-THREAD FIOS 11X100 BL (TROCAR) IMPLANT
TROCAR Z-THREAD FIOS 5X100MM (TROCAR) ×2 IMPLANT
TUBING INSUFFLATION 10FT LAP (TUBING) ×2 IMPLANT

## 2012-08-04 NOTE — Preoperative (Signed)
Beta Blockers   Reason not to administer Beta Blockers:Not Applicable Pt took Beta Blocker 08-05-11 AM

## 2012-08-04 NOTE — Anesthesia Preprocedure Evaluation (Addendum)
Anesthesia Evaluation  Patient identified by MRN, date of birth, ID band Patient awake    Reviewed: Allergy & Precautions, H&P , NPO status , Patient's Chart, lab work & pertinent test results  Airway Mallampati: III TM Distance: <3 FB Neck ROM: Full    Dental No notable dental hx.    Pulmonary neg pulmonary ROS,  breath sounds clear to auscultation  Pulmonary exam normal       Cardiovascular hypertension, Pt. on medications Rhythm:Regular Rate:Normal     Neuro/Psych negative neurological ROS  negative psych ROS   GI/Hepatic Neg liver ROS, GERD-  Medicated,  Endo/Other  negative endocrine ROS  Renal/GU negative Renal ROS  negative genitourinary   Musculoskeletal negative musculoskeletal ROS (+)   Abdominal   Peds negative pediatric ROS (+)  Hematology negative hematology ROS (+)   Anesthesia Other Findings   Reproductive/Obstetrics negative OB ROS                           Anesthesia Physical Anesthesia Plan  ASA: II  Anesthesia Plan: General   Post-op Pain Management:    Induction: Intravenous  Airway Management Planned: Oral ETT and Video Laryngoscope Planned  Additional Equipment:   Intra-op Plan:   Post-operative Plan: Extubation in OR  Informed Consent: I have reviewed the patients History and Physical, chart, labs and discussed the procedure including the risks, benefits and alternatives for the proposed anesthesia with the patient or authorized representative who has indicated his/her understanding and acceptance.   Dental advisory given  Plan Discussed with: CRNA and Surgeon  Anesthesia Plan Comments:         Anesthesia Quick Evaluation

## 2012-08-04 NOTE — Op Note (Signed)
08/04/2012  10:54 AM  PATIENT:  Randall Lopez  67 y.o. male  Patient Care Team: Lupita Raider, MD as PCP - General (Family Medicine) Charolett Bumpers, MD as Consulting Physician (Gastroenterology)  PRE-OPERATIVE DIAGNOSIS:  Chronic cholecystitis  POST-OPERATIVE DIAGNOSIS:  CHRONIC CHOLECYSTITIS  PROCEDURE:  Procedure(s): LAPAROSCOPIC CHOLECYSTECTOMY SINGLE PORT Laparoscopic lysis of adhesions 45 min (1/2 of case)  SURGEON:  Surgeon(s): Ardeth Sportsman, MD  ASSISTANT: none   ANESTHESIA:   local and general  EBL:  Total I/O In: 1000 [I.V.:1000] Out: 50 [Blood:50]  Delay start of Pharmacological VTE agent (>24hrs) due to surgical blood loss or risk of bleeding:  no  DRAINS: none   SPECIMEN:  Source of Specimen:  Gallbladder  DISPOSITION OF SPECIMEN:  PATHOLOGY  COUNTS:  YES  PLAN OF CARE: Discharge to home after PACU  PATIENT DISPOSITION:  PACU - hemodynamically stable.  INDICATION: Pleasant male with worsening bloating & epigastric pain with gallstone.  Negative GI workup.  I recommended surgery  The anatomy & physiology of hepatobiliary & pancreatic function was discussed.  The pathophysiology of gallbladder dysfunction was discussed.  Natural history risks without surgery was discussed.   I feel the risks of no intervention will lead to serious problems that outweigh the operative risks; therefore, I recommended cholecystectomy to remove the pathology.  I explained laparoscopic techniques with possible need for an open approach.  Probable cholangiogram to evaluate the bilary tract was explained as well.    Risks such as bleeding, infection, abscess, leak, injury to other organs, need for further treatment, heart attack, death, and other risks were discussed.  I noted a good likelihood this will help address the problem.  Possibility that this will not correct all abdominal symptoms was explained.  Goals of post-operative recovery were discussed as well.  We will work to  minimize complications.  An educational handout further explaining the pathology and treatment options was given as well.  Questions were answered.  The patient expresses understanding & wishes to proceed with surgery.   OR FINDINGS: Dense omental adhesions to gallbladder c/w chronic cholecystitis.  IOC WNL  DESCRIPTION:   The patient was identified & brought in the operating room. The patient was positioned supine with arms tucked. SCDs were active during the entire case. The patient underwent general anesthesia without any difficulty.  The abdomen was prepped and draped in a sterile fashion. A Surgical Timeout confirmed our plan.  I made a transverse curvilinear incision through the superior umbilical fold.  I placed a 5mm long port through the supraumbilical fascia using a modified Hassan cutdown technique. I began carbon dioxide insufflation. Camera inspection revealed no injury. There were no adhesions to the anterior abdominal wall supraumbilically.  I proceeded to continue with single site technique. I placed a #5 port in left upper aspect of the wound. I placed a 5 mm atraumatic grasper in the right inferior aspect of the wound.  I turned attention to the right upper quadrant.  The patient had moderately dense adhesions of omentum & mesocolon to the body & especially the infundibulum of the gallbladder.  The gallbladder fundus was elevated cephalad. I freed the omental adhesions & peritoneal coverings between the gallbladder and the liver on the posteriolateral and anteriomedial walls. I ended up switching to a dome-down style to see the posterior infundulum cleanly & use that to free off adhesions anteriorly.  I alternated between Harmonic & blunt Maryland dissection to help get a good critical view of the cystic  artery and cystic duct. I did further dissection to free a few centimeters of the  gallbladder off the liver bed to get a good critical view of the infundibulum and cystic duct. I  mobilized the cystic artery; and, after getting a good 360 view, ligated the cystic artery using clips & the Harmonic ultrasonic dissection. I skeletonized the cystic duct.  I placed a clip on the infundibulum. I did a partial cystic duct-otomy and ensured patency. I placed a 5 [place] cholangiocatheter through a puncture site at the right subcostal ridge of the abdominal wall and directed it into the cystic duct.  We ran a cholangiogram with dilute radio-opaque contrast and continuous fluoroscopy. Contrast flowed from a side branch consistent with cystic duct cannulization. Contrast flowed up the common hepatic duct into the right and left intrahepatic chains out to secondary radicals. Contrast flowed down the common bile duct easily across the normal ampulla into the duodenum.  This was consistent with a normal cholangiogram.  I removed the cholangiocatheter. I ligated the cystic duct with a 0-PDS endoloop.  I placed clips on the cystic duct x4.  I completed cystic duct transection. I freed the gallbladder from its remaining attachments to the liver. I ensured hemostasis on the gallbladder fossa of the liver and elsewhere. I inspected the rest of the abdomen & detected no injury nor bleeding elsewhere.  I removed the gallbladder out the supraumbilical fascia. I closed the fascia transversely using 0 Vicryl interrupted stitches. A closed the skin using 4-0 monocryl stitch.  Sterile dressing was applied. The patient was extubated & arrived in the PACU in stable condition..  I had discussed postoperative care with the patient in the holding area. I am about to locate the patient's family and discuss operative findings and postoperative goals / instructions.  Instructions are written in the chart as well.

## 2012-08-04 NOTE — Progress Notes (Signed)
Pt bladder scanned for 306cc of urine after gallbladder surgery. Ambulated in hallway and given coffee to drink. IV fluids continued.

## 2012-08-04 NOTE — H&P (View-Only) (Signed)
Subjective:     Patient ID: Randall Lopez, male   DOB: [birth year], 67 y.o.   MRN: [id number]  Abdominal Pain Associated symptoms include nausea. Pertinent negatives include no arthralgias, constipation, diarrhea, dysuria, fever, frequency, headaches, hematuria, myalgias or vomiting.    Randall Lopez  06-09-1946 [id number]  Patient Care Team: Lupita Raider, MD as PCP - General (Family Medicine) Charolett Bumpers, MD as Consulting Physician (Gastroenterology)  This patient is a 67 y.o.male who presents today for surgical evaluation at the request of Dr Clelia Croft.   Reason for visit: Persistent abdominal bloating and epigastric discomfort.  Gallstone.  Patient is a pleasant male.  Comes today with his wife.  I operated on a family member.  Two months ago he noticed he was getting bloating at night.  Now it happens every night.  He does not think it's necessary after eating.  He says he's always had a sensitive stomach for most of his life.  Recalls getting an EGD and H. Pylori diagnosis years ago.  Was treated by Dr. Danise Lopez.  His symptoms seem more consistent with reflux.  I recommended reevaluation with gastroenterology.  The patient has been compliant on an acid regimen.  He had an upper endoscopy which was underwhelming.  H. Pylori negative.  Minimal gastritis seen only microscopically.  No sprue.  The patient has had dwindling tolerance to foods.  Recalls a severe attack after eating a hotdog at a fair.  Very sharp upper bowel pain.  Occasionally radiating around his left side.  Sometimes around his right side.  Has a strict low-fat diet and still struggles with eating.  Otherwise quite physically active.  Records confirmed a gallstone from an ultrasound in August 2013.  He comes again with his wife.  Patient Active Problem List  Diagnosis  . GERD (gastroesophageal reflux disease)  . Hypertension  . Gallstone  . Bloating  . History of Helicobacter pylori infection ~2004     Past Medical History  Diagnosis Date  . Hypertension   . GERD (gastroesophageal reflux disease)   . Kidney stones   . Gout   . History of Helicobacter pylori infection ~2004 05/02/2012    Past Surgical History  Procedure Date  . Tonsillectomy   . Lithotripsy   . Lumbar laminectomy/decompression microdiscectomy 12/17/2011    Procedure: LUMBAR LAMINECTOMY/DECOMPRESSION MICRODISCECTOMY;  Surgeon: Venita Lick, MD;  Location: [facility];  Service: Orthopedics;  Laterality: Right;  L4-L5 Microdiscectomy    History   Social History  . Marital Status: Married    Spouse Name: N/A    Number of Children: N/A  . Years of Education: N/A   Occupational History  . Not on file.   Social History Main Topics  . Smoking status: Never Smoker   . Smokeless tobacco: Never Used  . Alcohol Use: No  . Drug Use: No  . Sexually Active:    Other Topics Concern  . Not on file   Social History Narrative  . No narrative on file    Family History  Problem Relation Age of Onset  . Diabetes Mother   . Stroke Father   . Stroke Maternal Grandmother   . Cancer Maternal Grandfather     kidney ca    Current Outpatient Prescriptions  Medication Sig Dispense Refill  . aspirin EC 81 MG tablet Take 81 mg by mouth daily.      Marland Kitchen atenolol (TENORMIN) 25 MG tablet       . diazepam (VALIUM) 5 MG  tablet Take 5 mg by mouth every 6 (six) hours as needed.      . folic acid (FOLVITE) 400 MCG tablet Take 400 mcg by mouth daily.      . indapamide (LOZOL) 1.25 MG tablet Take 1.25 mg by mouth every morning.      Marland Kitchen lisinopril (PRINIVIL,ZESTRIL) 10 MG tablet       . omega-3 acid ethyl esters (LOVAZA) 1 G capsule Take 2 g by mouth 2 (two) times daily.      Marland Kitchen omeprazole (PRILOSEC) 20 MG capsule Take 20 mg by mouth daily.      . potassium chloride SA (K-DUR,KLOR-CON) 20 MEQ tablet Take 20 mEq by mouth daily.          No Known Allergies  BP 136/70  Pulse 80  Resp 16  Ht 5\' 9"  (1.753 m)  Wt 191 lb (86.637 kg)   BMI 28.21 kg/m2  No results found.   Review of Systems  Constitutional: Negative for fever, chills and diaphoresis.  HENT: Negative for nosebleeds, sore throat, facial swelling, mouth sores, trouble swallowing and ear discharge.   Eyes: Negative for photophobia, discharge and visual disturbance.  Respiratory: Negative for cough, choking, chest tightness, shortness of breath, wheezing and stridor.   Cardiovascular: Negative for chest pain and palpitations.  Gastrointestinal: Positive for nausea, abdominal pain and abdominal distention. Negative for vomiting, diarrhea, constipation, blood in stool, anal bleeding and rectal pain.  Genitourinary: Negative for dysuria, urgency, frequency, hematuria, flank pain, decreased urine volume, difficulty urinating and testicular pain.  Musculoskeletal: Negative for myalgias, back pain, arthralgias and gait problem.  Skin: Negative for color change, pallor, rash and wound.  Neurological: Negative for dizziness, speech difficulty, weakness, numbness and headaches.  Hematological: Negative for adenopathy. Does not bruise/bleed easily.  Psychiatric/Behavioral: Negative for hallucinations, confusion and agitation.       Objective:   Physical Exam  Constitutional: He is oriented to person, place, and time. He appears well-developed and well-nourished. No distress.  HENT:  Head: Normocephalic.  Mouth/Throat: Oropharynx is clear and moist. No oropharyngeal exudate.  Eyes: Conjunctivae normal and EOM are normal. Pupils are equal, round, and reactive to light. No scleral icterus.  Neck: Normal range of motion. Neck supple. No tracheal deviation present.  Cardiovascular: Normal rate, regular rhythm and intact distal pulses.   Pulmonary/Chest: Effort normal and breath sounds normal. No respiratory distress.  Abdominal: Soft. He exhibits no distension and no mass. There is no tenderness. There is no rebound and no guarding. Hernia confirmed negative in the  right inguinal area and confirmed negative in the left inguinal area.  Musculoskeletal: Normal range of motion. He exhibits no tenderness.  Lymphadenopathy:    He has no cervical adenopathy.       Right: No inguinal adenopathy present.       Left: No inguinal adenopathy present.  Neurological: He is alert and oriented to person, place, and time. No cranial nerve deficit. He exhibits normal muscle tone. Coordination normal.  Skin: Skin is warm and dry. No rash noted. He is not diaphoretic. No erythema. No pallor.  Psychiatric: He has a normal mood and affect. His behavior is normal. Judgment and thought content normal.       Assessment:     Worsening postprandial pain or suspicious for biliary colic.  Forgut workup by gastroenterology negative.    Plan:     I more inclined to offer cholecystectomy given the fact that his story is more consistent with biliary colic, and  gastroenterology workup was otherwise negative.  He wishes to proceed with surgery now as he is getting more miserable.  I did discuss what chole would entail:  The anatomy & physiology of hepatobiliary & pancreatic function was discussed.  The pathophysiology of gallbladder dysfunction was discussed.  Natural history risks without surgery was discussed.   I feel the risks of no intervention will lead to serious problems that outweigh the operative risks; therefore, I recommended cholecystectomy to remove the pathology.  I explained laparoscopic techniques with possible need for an open approach.  Probable cholangiogram to evaluate the bilary tract was explained as well.    Risks such as bleeding, infection, abscess, leak, injury to other organs, need for further treatment, heart attack, death, and other risks were discussed.  I noted a good likelihood this will help address the problem.  Possibility that this will not correct all abdominal symptoms was explained.  Goals of post-operative recovery were discussed as well.  We  will work to minimize complications.  An educational handout further explaining the pathology and treatment options was given as well.  Questions were answered.  The patient & his wife expressed understanding & wish to hold off on surgery.

## 2012-08-04 NOTE — Interval H&P Note (Signed)
History and Physical Interval Note:  08/04/2012 8:42 AM  Randall Lopez  has presented today for surgery, with the diagnosis of Chronic cholecystitis  The various methods of treatment have been discussed with the patient and family. After consideration of risks, benefits and other options for treatment, the patient has consented to  Procedure(s) (LRB) with comments: LAPAROSCOPIC CHOLECYSTECTOMY SINGLE PORT (N/A) - with Intraoperative Cholangiogram  as a surgical intervention .  The patient's history has been reviewed, patient examined, no change in status, stable for surgery.  I have reviewed the patient's chart and labs.  Questions were answered to the patient's satisfaction.     [name]

## 2012-08-04 NOTE — Transfer of Care (Signed)
Immediate Anesthesia Transfer of Care Note  Patient: Randall Lopez  Procedure(s) Performed: Procedure(s) (LRB): LAPAROSCOPIC CHOLECYSTECTOMY SINGLE PORT (N/A)  Patient Location: PACU  Anesthesia Type: General  Level of Consciousness: sedated, patient cooperative and responds to stimulaton  Airway & Oxygen Therapy: Patient Spontanous Breathing and Patient connected to face mask oxgen  Post-op Assessment: Report given to PACU RN and Post -op Vital signs reviewed and stable  Post vital signs: Reviewed and stable  Complications: No apparent anesthesia complications

## 2012-08-04 NOTE — Anesthesia Postprocedure Evaluation (Signed)
  Anesthesia Post-op Note  Patient: Randall Lopez  Procedure(s) Performed: Procedure(s) (LRB): LAPAROSCOPIC CHOLECYSTECTOMY SINGLE PORT (N/A)  Patient Location: PACU  Anesthesia Type: General  Level of Consciousness: awake and alert   Airway and Oxygen Therapy: Patient Spontanous Breathing  Post-op Pain: mild  Post-op Assessment: Post-op Vital signs reviewed, Patient's Cardiovascular Status Stable, Respiratory Function Stable, Patent Airway and No signs of Nausea or vomiting  Last Vitals:  Filed Vitals:   08/04/12 1115  BP: 143/67  Pulse: 69  Temp:   Resp: 14    Post-op Vital Signs: stable   Complications: No apparent anesthesia complications

## 2012-08-07 ENCOUNTER — Encounter (HOSPITAL_COMMUNITY): Payer: Self-pay | Admitting: Surgery

## 2012-08-21 ENCOUNTER — Ambulatory Visit (INDEPENDENT_AMBULATORY_CARE_PROVIDER_SITE_OTHER): Payer: Medicare Other | Admitting: Surgery

## 2012-08-21 ENCOUNTER — Encounter (INDEPENDENT_AMBULATORY_CARE_PROVIDER_SITE_OTHER): Payer: Self-pay | Admitting: Surgery

## 2012-08-21 VITALS — BP 122/76 | HR 72 | Temp 97.8°F | Resp 14 | Ht 69.0 in | Wt 198.8 lb

## 2012-08-21 DIAGNOSIS — K8010 Calculus of gallbladder with chronic cholecystitis without obstruction: Secondary | ICD-10-CM

## 2012-08-21 NOTE — Patient Instructions (Signed)

## 2012-08-21 NOTE — Progress Notes (Signed)
Subjective:     Patient ID: Randall Lopez, male   DOB: 03-07-1946, 67 y.o.   MRN: [id number]  HPI  Randall Lopez  [birth year] [id number]  Patient Care Team: Lupita Raider, MD as PCP - General (Family Medicine) Charolett Bumpers, MD as Consulting Physician (Gastroenterology)  This patient is a 67 y.o.male who presents today for surgical evaluation   LAPAROSCOPIC CHOLECYSTECTOMY SINGLE SITE  Laparoscopic lysis of adhesions 45 min (1/2 of case)   Pathology: Diagnosis Gallbladder - CHRONIC CHOLECYSTITIS, CHOLELITHIASIS, AND CHOLESTEROLOSIS.   The patient comes in today feeling well.  Chronic nausea has faded away.  Appetite is better.  Trying to stick to a low-fat diet.  Had mild constipation initially but now is moving bowels regular.  Back to regular activity.  He seems happy with how well he is recovered so far.  He is in good spirits.  No fevers chills or sweats.  Energy level good.   Patient Active Problem List  Diagnosis  . GERD (gastroesophageal reflux disease)  . Hypertension  . Chronic cholecystitis with calculus  . History of Helicobacter pylori infection ~2004    Past Medical History  Diagnosis Date  . Hypertension   . GERD (gastroesophageal reflux disease)   . Gout   . History of Helicobacter pylori infection ~2004 05/02/2012    Past Surgical History  Procedure Date  . Tonsillectomy   . Lithotripsy   . Lumbar laminectomy/decompression microdiscectomy 12/17/2011    Procedure: LUMBAR LAMINECTOMY/DECOMPRESSION MICRODISCECTOMY;  Surgeon: Venita Lick, MD;  Location: [facility];  Service: Orthopedics;  Laterality: Right;  L4-L5 Microdiscectomy  . Laparoscopic cholecystectomy single port 08/04/2012    Procedure: LAPAROSCOPIC CHOLECYSTECTOMY SINGLE PORT;  Surgeon: Ardeth Sportsman, MD;  Location: [facility];  Service: General;  Laterality: N/A;  LYSIS OF ADHESIONS with Intraoperative Cholangiogram     History   Social History  . Marital Status: Married    Spouse Name:  N/A    Number of Children: N/A  . Years of Education: N/A   Occupational History  . Not on file.   Social History Main Topics  . Smoking status: Never Smoker   . Smokeless tobacco: Never Used  . Alcohol Use: No  . Drug Use: No  . Sexually Active:    Other Topics Concern  . Not on file   Social History Narrative  . No narrative on file    Family History  Problem Relation Age of Onset  . Diabetes Mother   . Stroke Father   . Stroke Maternal Grandmother   . Cancer Maternal Grandfather     kidney ca    Current Outpatient Prescriptions  Medication Sig Dispense Refill  . aspirin EC 81 MG tablet Take 81 mg by mouth daily.      Marland Kitchen atenolol (TENORMIN) 25 MG tablet Take 25 mg by mouth every morning. 1/2 tab daily      . folic acid (FOLVITE) 400 MCG tablet Take 400 mcg by mouth daily.      Marland Kitchen ibuprofen (ADVIL,MOTRIN) 200 MG tablet Take 3 tablets (600 mg total) by mouth every 6 (six) hours as needed for pain. For pain  40 tablet  2  . omeprazole (PRILOSEC) 20 MG capsule Take 20 mg by mouth every morning.          No Known Allergies  BP 122/76  Pulse 72  Temp 97.8 F (36.6 C)  Resp 14  Ht 5\' 9"  (1.753 m)  Wt 198 lb 12.8 oz (90.175 kg)  BMI 29.36 kg/m2  Dg Chest 2 View  07/28/2012  *RADIOLOGY REPORT*  Clinical Data: Preop  CHEST - 2 VIEW  Comparison: 12/16/2011  Findings: Increased AP diameter of the chest.  Low volumes.  No pneumothorax and no pleural effusion.  No lung mass or consolidation.  IMPRESSION: No active cardiopulmonary disease.   Original Report Authenticated By: Jolaine Click, M.D.    Dg Cholangiogram Operative  08/04/2012  *RADIOLOGY REPORT*  Clinical Data: Gallstones, right upper quadrant abdominal pain.  INTRAOPERATIVE CHOLANGIOGRAM  Comparison:  None.  Findings:  Intraoperative angiographic images of the right upper abdominal quadrant during laparoscopic cholecystectomy are provided for review.  Surgical clips overlie the expected location of the gallbladder  fossa.  Contrast injection demonstrates selective cannulation of the central aspect of the cystic duct.  There is passage of contrast through the central aspect of the cystic duct with filling of a normal sized common bile duct.  There is passage of contrast though the CBD and into the descending portion of the duodenum.  There is minimal reflux of injected contrast into the common hepatic duct and central aspect of the nondilated intrahepatic biliary system.  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  There are no discrete filling defects within the opacified portions of the biliary system to suggest the presence of choledocholithiasis.  IMPRESSION:  Intraoperative cholangiogram as above.  No discrete filling defects to suggest the presence of choledocholithiasis.   Original Report Authenticated By: Tacey Ruiz, MD       Review of Systems  Constitutional: Negative for fever, chills and diaphoresis.  HENT: Negative for sore throat, trouble swallowing and neck pain.   Eyes: Negative for photophobia and visual disturbance.  Respiratory: Negative for choking and shortness of breath.   Cardiovascular: Negative for chest pain and palpitations.  Gastrointestinal: Negative for nausea, vomiting, abdominal distention, anal bleeding and rectal pain.  Genitourinary: Negative for dysuria, urgency, difficulty urinating and testicular pain.  Musculoskeletal: Negative for myalgias, arthralgias and gait problem.  Skin: Negative for color change and rash.  Neurological: Negative for dizziness, speech difficulty, weakness and numbness.  Hematological: Negative for adenopathy.  Psychiatric/Behavioral: Negative for hallucinations, confusion and agitation.       Objective:   Physical Exam  Constitutional: He is oriented to person, place, and time. He appears well-developed and well-nourished. No distress.    HENT:  Head: Normocephalic.  Mouth/Throat: Oropharynx is clear and moist. No oropharyngeal exudate.  Eyes: Conjunctivae normal and EOM are normal. Pupils are equal, round, and reactive to light. No scleral icterus.  Neck: Normal range of motion. No tracheal deviation present.  Cardiovascular: Normal rate, normal heart sounds and intact distal pulses.   Pulmonary/Chest: Effort normal. No respiratory distress.  Abdominal: Soft. He exhibits no distension. There is no tenderness. Hernia confirmed negative in the right inguinal area and confirmed negative in the left inguinal area.       Incisions clean with normal healing ridges.  No hernias  Musculoskeletal: Normal range of motion. He exhibits no tenderness.  Neurological: He is alert and oriented to person, place, and time. No cranial nerve deficit. He exhibits normal muscle tone. Coordination normal.  Skin: Skin is warm and dry. No rash noted. He is not diaphoretic.  Psychiatric: He has a normal mood and affect. His behavior is normal.       Assessment:     2.5 WEEKS S/P  LAP CHOLE FOR CHRONIC CHOLECYSTITIS, RECOVERING WELL    Plan:     Increase activity as tolerated to regular activity.  Do not push through pain.  Diet as tolerated. Bowel regimen to avoid problems.  Return to clinic p.r.n.   Instructions discussed.  Followup with primary care physician for other health issues as would normally be done.  Questions answered.  The patient expressed understanding and appreciation

## 2012-09-16 ENCOUNTER — Other Ambulatory Visit: Payer: Self-pay

## 2012-10-23 ENCOUNTER — Encounter (INDEPENDENT_AMBULATORY_CARE_PROVIDER_SITE_OTHER): Payer: Self-pay

## 2012-10-26 ENCOUNTER — Other Ambulatory Visit: Payer: Self-pay | Admitting: Urology

## 2012-10-27 ENCOUNTER — Encounter (HOSPITAL_COMMUNITY): Payer: Self-pay | Admitting: Pharmacy Technician

## 2012-10-27 ENCOUNTER — Encounter (HOSPITAL_COMMUNITY): Payer: Self-pay | Admitting: *Deleted

## 2012-10-27 NOTE — Pre-Procedure Instructions (Signed)
Asked to bring blue folder the day of the procedure,insurance card,I.D. driver's license,wear comfortable clothing and have a driver for the day. Asked not to take Advil,Motrin,Ibuprofen,Aleve or any NSAIDS, Aspirin, or Toradol for 72 hours prior to procedure,  No vitamins or herbal medications 7 days prior to procedure. Instructed to take laxative per doctor's office instructions and eat a light dinner the evening before procedure.   To arrive at 1030 for lithotripsy procedure.  

## 2012-11-02 ENCOUNTER — Encounter (HOSPITAL_COMMUNITY)
Admission: RE | Disposition: A | Discharge status: Home / Self Care | Payer: Self-pay | Source: Ambulatory Visit | Attending: Urology

## 2012-11-02 ENCOUNTER — Ambulatory Visit (HOSPITAL_COMMUNITY)
Admission: RE | Admit: 2012-11-02 | Discharge: 2012-11-02 | Disposition: A | Discharge status: Home / Self Care | Payer: Medicare Other | Source: Ambulatory Visit | Attending: Urology | Admitting: Urology

## 2012-11-02 ENCOUNTER — Ambulatory Visit (HOSPITAL_COMMUNITY): Payer: Medicare Other

## 2012-11-02 ENCOUNTER — Encounter (HOSPITAL_COMMUNITY): Payer: Self-pay | Admitting: *Deleted

## 2012-11-02 DIAGNOSIS — I10 Essential (primary) hypertension: Secondary | ICD-10-CM | POA: Insufficient documentation

## 2012-11-02 DIAGNOSIS — R002 Palpitations: Secondary | ICD-10-CM | POA: Insufficient documentation

## 2012-11-02 DIAGNOSIS — N200 Calculus of kidney: Secondary | ICD-10-CM | POA: Insufficient documentation

## 2012-11-02 SURGERY — LITHOTRIPSY, ESWL
Anesthesia: LOCAL | Laterality: Left

## 2012-11-02 MED ORDER — DIAZEPAM 5 MG PO TABS
10.0000 mg | ORAL_TABLET | ORAL | Status: AC
  Administered 2012-11-02: 10 mg via ORAL
  Filled 2012-11-02: qty 2

## 2012-11-02 MED ORDER — ONDANSETRON HCL 4 MG/2ML IJ SOLN
4.0000 mg | Freq: Once | INTRAMUSCULAR | Status: AC
  Administered 2012-11-02: 4 mg via INTRAVENOUS
  Filled 2012-11-02: qty 2

## 2012-11-02 MED ORDER — DIPHENHYDRAMINE HCL 25 MG PO CAPS
25.0000 mg | ORAL_CAPSULE | ORAL | Status: AC
  Administered 2012-11-02: 25 mg via ORAL
  Filled 2012-11-02: qty 1

## 2012-11-02 MED ORDER — HYDROMORPHONE HCL PF 1 MG/ML IJ SOLN
1.0000 mg | Freq: Once | INTRAMUSCULAR | Status: AC
  Administered 2012-11-02: 1 mg via INTRAVENOUS
  Filled 2012-11-02: qty 1

## 2012-11-02 MED ORDER — CIPROFLOXACIN IN D5W 400 MG/200ML IV SOLN
400.0000 mg | INTRAVENOUS | Status: AC
  Administered 2012-11-02: 400 mg via INTRAVENOUS
  Filled 2012-11-02: qty 200

## 2012-11-02 MED ORDER — SODIUM CHLORIDE 0.9 % IV SOLN
INTRAVENOUS | Status: DC
  Administered 2012-11-02: 11:00:00 via INTRAVENOUS

## 2012-11-02 MED ORDER — OXYCODONE-ACETAMINOPHEN 5-325 MG PO TABS
1.0000 | ORAL_TABLET | ORAL | Status: DC | PRN
  Administered 2012-11-02: 1 via ORAL
  Filled 2012-11-02: qty 1

## 2012-11-02 NOTE — Progress Notes (Addendum)
Called Dr Berneice Heinrich for antiemetic as patient nauseated and vomited about 150cc dark colored emesis (cola colored).   Called Dr Berneice Heinrich second time for antiemetic and pain meds. Order received.

## 2012-11-02 NOTE — H&P (Signed)
Randall Lopez is an 67 y.o. male.    Chief Complaint: Pre-Op Left Shockwave Lithotripst  HPI:   1 - Nephrolithiasis - Pre 2014 - 2 prior episodes of stones. One spontanouelsy passed, another treated with SWL in 1995. No recetn colic.  09/2012 - Left flank pain prompted KUB / Renal US with left 6mm UPJ stone and scattered small bilateral intrarenal stones, no hydro  2 - LUTS - Pt with very mild weak stream and nocturia x1-2 that is ov minimal bother. DRE 30gm 06/2012. Had h/o L-spine surgery but no LE weakness.  PMH sig for HLD, HTN, GERD, L spine surgery. No CV disease. No strong blood thinners.   Today Randall Lopez is seen to proceed with left SWL for his left proximal ureteral stone. No interval fevers or stone passage. Most recent UCX negative.  Past Medical History  Diagnosis Date  . Hypertension   . GERD (gastroesophageal reflux disease)   . Gout   . History of Helicobacter pylori infection ~2004 05/02/2012  . Chronic kidney disease     kidney stone    Past Surgical History  Procedure Laterality Date  . Tonsillectomy    . Lithotripsy    . Lumbar laminectomy/decompression microdiscectomy  12/17/2011    Procedure: LUMBAR LAMINECTOMY/DECOMPRESSION MICRODISCECTOMY;  Surgeon: Venita Lick, MD;  Location: [facility];  Service: Orthopedics;  Laterality: Right;  L4-L5 Microdiscectomy  . Laparoscopic cholecystectomy single port  08/04/2012    Procedure: LAPAROSCOPIC CHOLECYSTECTOMY SINGLE PORT;  Surgeon: Ardeth Sportsman, MD;  Location: [facility];  Service: General;  Laterality: N/A;  LYSIS OF ADHESIONS with Intraoperative Cholangiogram   . Cholecystectomy    . Back surgery      Family History  Problem Relation Age of Onset  . Diabetes Mother   . Stroke Father   . Stroke Maternal Grandmother   . Cancer Maternal Grandfather     kidney ca   Social History:  reports that he has never smoked. He has never used smokeless tobacco. He reports that he does not drink alcohol or use illicit  drugs.  Allergies: No Known Allergies  No prescriptions prior to admission    No results found for this or any previous visit (from the past 48 hour(s)). No results found.  Review of Systems  Constitutional: Negative.  Negative for fever and chills.  HENT: Negative.   Eyes: Negative.   Respiratory: Negative.   Cardiovascular: Negative.   Gastrointestinal: Negative.   Genitourinary: Negative.   Musculoskeletal: Negative.   Skin: Negative.   Neurological: Negative.   Endo/Heme/Allergies: Negative.   Psychiatric/Behavioral: Negative.     There were no vitals taken for this visit. Physical Exam  Constitutional: He is oriented to person, place, and time. He appears well-developed and well-nourished.  HENT:  Head: Normocephalic and atraumatic.  Eyes: EOM are normal. Pupils are equal, round, and reactive to light.  Neck: Normal range of motion. Neck supple.  Cardiovascular: Normal rate.   Respiratory: Effort normal and breath sounds normal.  GI: Soft. Bowel sounds are normal.  Genitourinary: Penis normal.  Mild Lt CVAT  Musculoskeletal: Normal range of motion.  Neurological: He is alert and oriented to person, place, and time.  Skin: Skin is warm and dry.  Psychiatric: He has a normal mood and affect. His behavior is normal. Judgment and thought content normal.     Assessment/Plan  1 - Nephrolithiasis  / Flank Pain - We re-discussed shockwave lithotripsy in detail as well as my "rule of 9s" with stones <  9mm, less than 900 HU, and skin to stone distance <9cm having approximately 90% treatment success with single session of treatment. We then addressed how stones that are larger, more dense, and in patients with less favorable anatomy have incrementally decreased success rates. We discussed risks including, bleeding, infection, hematoma, loss of kidney, need for staged therapy, need for adjunctive therapy and requirement to refrain from any anticoagulants, anti-platelet or  aspirin-like products peri-procedureally. After careful consideration, the patient has chosen to proceed today scheudled.    [name] 11/02/2012, 7:57 AM

## 2012-11-04 ENCOUNTER — Encounter (HOSPITAL_COMMUNITY): Payer: Self-pay | Admitting: *Deleted

## 2012-11-04 ENCOUNTER — Inpatient Hospital Stay (HOSPITAL_COMMUNITY)
Admission: EM | Admit: 2012-11-04 | Discharge: 2012-11-06 | DRG: 699 | Disposition: A | Discharge status: Home / Self Care | Payer: Medicare Other | Attending: Urology | Admitting: Urology

## 2012-11-04 ENCOUNTER — Emergency Department (HOSPITAL_COMMUNITY): Payer: Medicare Other

## 2012-11-04 DIAGNOSIS — N189 Chronic kidney disease, unspecified: Secondary | ICD-10-CM | POA: Diagnosis present

## 2012-11-04 DIAGNOSIS — Z79899 Other long term (current) drug therapy: Secondary | ICD-10-CM

## 2012-11-04 DIAGNOSIS — R509 Fever, unspecified: Secondary | ICD-10-CM

## 2012-11-04 DIAGNOSIS — Y849 Medical procedure, unspecified as the cause of abnormal reaction of the patient, or of later complication, without mention of misadventure at the time of the procedure: Secondary | ICD-10-CM | POA: Diagnosis present

## 2012-11-04 DIAGNOSIS — M109 Gout, unspecified: Secondary | ICD-10-CM | POA: Diagnosis present

## 2012-11-04 DIAGNOSIS — N201 Calculus of ureter: Secondary | ICD-10-CM | POA: Diagnosis present

## 2012-11-04 DIAGNOSIS — R109 Unspecified abdominal pain: Secondary | ICD-10-CM

## 2012-11-04 DIAGNOSIS — I129 Hypertensive chronic kidney disease with stage 1 through stage 4 chronic kidney disease, or unspecified chronic kidney disease: Secondary | ICD-10-CM | POA: Diagnosis present

## 2012-11-04 DIAGNOSIS — K219 Gastro-esophageal reflux disease without esophagitis: Secondary | ICD-10-CM | POA: Diagnosis present

## 2012-11-04 DIAGNOSIS — R000 Tachycardia, unspecified: Secondary | ICD-10-CM

## 2012-11-04 DIAGNOSIS — S37019A Minor contusion of unspecified kidney, initial encounter: Principal | ICD-10-CM | POA: Diagnosis present

## 2012-11-04 LAB — CBC WITH DIFFERENTIAL/PLATELET
Basophils Relative: 0 % (ref 0–1)
HCT: 35.5 % — ABNORMAL LOW (ref 39.0–52.0)
Hemoglobin: 12.3 g/dL — ABNORMAL LOW (ref 13.0–17.0)
Lymphocytes Relative: 10 % — ABNORMAL LOW (ref 12–46)
MCHC: 34.6 g/dL (ref 30.0–36.0)
MCV: 83.7 fL (ref 78.0–100.0)
Monocytes Absolute: 1.8 10*3/uL — ABNORMAL HIGH (ref 0.1–1.0)
Monocytes Relative: 14 % — ABNORMAL HIGH (ref 3–12)
Neutro Abs: 9.9 10*3/uL — ABNORMAL HIGH (ref 1.7–7.7)

## 2012-11-04 LAB — URINALYSIS, ROUTINE W REFLEX MICROSCOPIC
Ketones, ur: NEGATIVE mg/dL
Leukocytes, UA: NEGATIVE
Nitrite: NEGATIVE
Protein, ur: NEGATIVE mg/dL
Urobilinogen, UA: 1 mg/dL (ref 0.0–1.0)

## 2012-11-04 LAB — COMPREHENSIVE METABOLIC PANEL
Alkaline Phosphatase: 67 U/L (ref 39–117)
BUN: 27 mg/dL — ABNORMAL HIGH (ref 6–23)
Chloride: 98 mEq/L (ref 96–112)
GFR calc Af Amer: 42 mL/min — ABNORMAL LOW (ref >90)
GFR calc non Af Amer: 37 mL/min — ABNORMAL LOW (ref >90)
Glucose, Bld: 99 mg/dL (ref 70–99)
Potassium: 3.6 mEq/L (ref 3.5–5.1)
Total Bilirubin: 1.1 mg/dL (ref 0.3–1.2)

## 2012-11-04 MED ORDER — HYDROMORPHONE HCL PF 1 MG/ML IJ SOLN
1.0000 mg | INTRAMUSCULAR | Status: DC | PRN
  Administered 2012-11-04 – 2012-11-06 (×5): 1 mg via INTRAVENOUS
  Filled 2012-11-04 (×4): qty 1

## 2012-11-04 MED ORDER — TAMSULOSIN HCL 0.4 MG PO CAPS
0.4000 mg | ORAL_CAPSULE | Freq: Every day | ORAL | Status: DC
  Administered 2012-11-05 – 2012-11-06 (×2): 0.4 mg via ORAL
  Filled 2012-11-04 (×2): qty 1

## 2012-11-04 MED ORDER — ONDANSETRON HCL 4 MG/2ML IJ SOLN
4.0000 mg | Freq: Once | INTRAMUSCULAR | Status: AC
  Administered 2012-11-04: 4 mg via INTRAVENOUS
  Filled 2012-11-04: qty 2

## 2012-11-04 MED ORDER — SODIUM CHLORIDE 0.9 % IV SOLN
1000.0000 mL | Freq: Once | INTRAVENOUS | Status: AC
  Administered 2012-11-04: 1000 mL via INTRAVENOUS

## 2012-11-04 MED ORDER — DEXTROSE 5 % IV SOLN
1.0000 g | Freq: Once | INTRAVENOUS | Status: AC
  Administered 2012-11-04: 1 g via INTRAVENOUS
  Filled 2012-11-04: qty 10

## 2012-11-04 MED ORDER — CIPROFLOXACIN IN D5W 400 MG/200ML IV SOLN: 400.0000 mg | Freq: Once | INTRAVENOUS | Status: DC

## 2012-11-04 NOTE — ED Provider Notes (Signed)
History     CSN: [id number]  Arrival date & time 11/04/12  1191   First MD Initiated Contact with Patient 11/04/12 2001      Chief Complaint  Patient presents with  . Fever  . Flank Pain    (Consider location/radiation/quality/duration/timing/severity/associated sxs/prior treatment) HPI The patient presents today as after elective lithotripsy with left flank pain, fever, tachycardia.  He states the procedure was well tolerated.  Initially the patient only had pain, but over the past day has developed fever, tachycardia without palpitations chest pain, dyspnea, lightheadedness, syncope, nausea, vomiting or dysuria.  There continues to be hematuria. The patient's flank pain was minimally controlled with oxycodone.  Past Medical History  Diagnosis Date  . Hypertension   . GERD (gastroesophageal reflux disease)   . Gout   . History of Helicobacter pylori infection ~2004 05/02/2012  . Chronic kidney disease     kidney stone    Past Surgical History  Procedure Laterality Date  . Tonsillectomy    . Lithotripsy    . Lumbar laminectomy/decompression microdiscectomy  12/17/2011    Procedure: LUMBAR LAMINECTOMY/DECOMPRESSION MICRODISCECTOMY;  Surgeon: Venita Lick, MD;  Location: [facility];  Service: Orthopedics;  Laterality: Right;  L4-L5 Microdiscectomy  . Laparoscopic cholecystectomy single port  08/04/2012    Procedure: LAPAROSCOPIC CHOLECYSTECTOMY SINGLE PORT;  Surgeon: Ardeth Sportsman, MD;  Location: [facility];  Service: General;  Laterality: N/A;  LYSIS OF ADHESIONS with Intraoperative Cholangiogram   . Cholecystectomy    . Back surgery      Family History  Problem Relation Age of Onset  . Diabetes Mother   . Stroke Father   . Stroke Maternal Grandmother   . Cancer Maternal Grandfather     kidney ca    History  Substance Use Topics  . Smoking status: Never Smoker   . Smokeless tobacco: Never Used  . Alcohol Use: No      Review of Systems  All other systems reviewed and  are negative.    Allergies  Review of patient's allergies indicates no known allergies.  Home Medications   Current Outpatient Rx  Name  Route  Sig  Dispense  Refill  . atenolol (TENORMIN) 25 MG tablet   Oral   Take 12.5 mg by mouth every morning. 1/2 tab daily         . omeprazole (PRILOSEC) 20 MG capsule   Oral   Take 20 mg by mouth every morning.          Marland Kitchen oxyCODONE-acetaminophen (PERCOCET/ROXICET) 5-325 MG per tablet   Oral   Take 1 tablet by mouth every 4 (four) hours as needed for pain.           BP 131/73  Pulse 126  Temp(Src) 100.2 F (37.9 C) (Oral)  Ht 5\' 9"  (1.753 m)  Wt 190 lb (86.183 kg)  BMI 28.05 kg/m2  SpO2 96%  Physical Exam  Nursing note and vitals reviewed. Constitutional: He is oriented to person, place, and time. He appears well-developed. No distress.  HENT:  Head: Normocephalic and atraumatic.  Eyes: Conjunctivae and EOM are normal.  Cardiovascular: Regular rhythm.  Tachycardia present.   Pulmonary/Chest: Effort normal. No stridor. No respiratory distress.  Abdominal: Normal appearance. He exhibits no distension. There is tenderness in the left upper quadrant. There is CVA tenderness.  Musculoskeletal: He exhibits no edema.  Neurological: He is alert and oriented to person, place, and time.  Skin: Skin is warm and dry.  Psychiatric: He has a normal  mood and affect.    ED Course  Procedures (including critical care time)  Labs Reviewed  CBC WITH DIFFERENTIAL - Abnormal; Notable for the following:    WBC 13.2 (*)    Hemoglobin 12.3 (*)    HCT 35.5 (*)    Neutro Abs 9.9 (*)    Lymphocytes Relative 10 (*)    Monocytes Relative 14 (*)    Monocytes Absolute 1.8 (*)    All other components within normal limits  PRO B NATRIURETIC PEPTIDE - Abnormal; Notable for the following:    Pro B Natriuretic peptide (BNP) 398.6 (*)    All other components within normal limits  COMPREHENSIVE METABOLIC PANEL - Abnormal; Notable for the  following:    Sodium 134 (*)    BUN 27 (*)    Creatinine, Ser 1.84 (*)    Albumin 3.3 (*)    GFR calc non Af Amer 37 (*)    GFR calc Af Amer 42 (*)    All other components within normal limits  URINALYSIS, ROUTINE W REFLEX MICROSCOPIC   No results found.   No diagnosis found.  Cardiac 121 sinus tach abnormal Pulse ox 97% room air normal  I discussed the patient's case with our urology colleagues.  Update: Patient appears calm, though he continues to have pain.  Heart rate decreasing. MDM  This patient presents with his after lithotripsy, now with fever, tachycardia.  On exam the patient is tachycardic, with leukocytosis, decreased renal function, and CT results concerning for hematoma versus edema.  However, the patient was awake, alert, oriented, seemingly in no distress.  Given the abnormalities several days following the procedure he was admitted for further evaluation and management after discussed his case with our urology team.  Gerhard Munch, MD 11/04/12 [phone]

## 2012-11-04 NOTE — ED Notes (Signed)
Pt states that he passed a large fragment of stone this afternoon.

## 2012-11-04 NOTE — ED Notes (Signed)
Pt had lithotripsy Thursday by Dr Kathrynn Running and on Friday he had fever of 101.5 and severe left flank pain,  He went to dr Kathrynn Running and he gave him antiemetic medication and stronger pain medication,  Pt states today he feels even worse, and his heart rate has been high.

## 2012-11-05 LAB — CBC
HCT: 32.2 % — ABNORMAL LOW (ref 39.0–52.0)
MCHC: 35.1 g/dL (ref 30.0–36.0)
MCV: 83.6 fL (ref 78.0–100.0)
RDW: 12.6 % (ref 11.5–15.5)
WBC: 13.6 10*3/uL — ABNORMAL HIGH (ref 4.0–10.5)

## 2012-11-05 LAB — BASIC METABOLIC PANEL
BUN: 24 mg/dL — ABNORMAL HIGH (ref 6–23)
Chloride: 101 mEq/L (ref 96–112)
Creatinine, Ser: 1.61 mg/dL — ABNORMAL HIGH (ref 0.50–1.35)
GFR calc Af Amer: 50 mL/min — ABNORMAL LOW (ref >90)

## 2012-11-05 MED ORDER — ONDANSETRON HCL 4 MG/2ML IJ SOLN: 4.0000 mg | INTRAMUSCULAR | Status: DC | PRN

## 2012-11-05 MED ORDER — OXYCODONE-ACETAMINOPHEN 5-325 MG PO TABS
1.0000 | ORAL_TABLET | ORAL | Status: DC | PRN
  Administered 2012-11-05: 1 via ORAL
  Filled 2012-11-05: qty 1

## 2012-11-05 MED ORDER — DEXTROSE 5 % IV SOLN
1.0000 g | INTRAVENOUS | Status: DC
  Administered 2012-11-05 (×2): 1 g via INTRAVENOUS
  Filled 2012-11-05 (×2): qty 10

## 2012-11-05 MED ORDER — ONDANSETRON HCL 4 MG/2ML IJ SOLN: 4.0000 mg | Freq: Three times a day (TID) | INTRAMUSCULAR | Status: AC | PRN

## 2012-11-05 MED ORDER — PANTOPRAZOLE SODIUM 40 MG PO TBEC
40.0000 mg | DELAYED_RELEASE_TABLET | Freq: Every day | ORAL | Status: DC
  Administered 2012-11-05 – 2012-11-06 (×2): 40 mg via ORAL
  Filled 2012-11-05 (×2): qty 1

## 2012-11-05 MED ORDER — HYDROMORPHONE HCL PF 1 MG/ML IJ SOLN
1.0000 mg | INTRAMUSCULAR | Status: AC | PRN
  Administered 2012-11-05 (×2): 1 mg via INTRAVENOUS
  Filled 2012-11-05 (×2): qty 1

## 2012-11-05 MED ORDER — HYDROMORPHONE HCL PF 1 MG/ML IJ SOLN
0.5000 mg | INTRAMUSCULAR | Status: DC | PRN
  Filled 2012-11-05: qty 1

## 2012-11-05 MED ORDER — KCL IN DEXTROSE-NACL 10-5-0.45 MEQ/L-%-% IV SOLN
INTRAVENOUS | Status: DC
  Administered 2012-11-05 (×2): via INTRAVENOUS
  Filled 2012-11-05 (×4): qty 1000

## 2012-11-05 MED ORDER — ATENOLOL 12.5 MG HALF TABLET
12.5000 mg | ORAL_TABLET | Freq: Every morning | ORAL | Status: DC
  Administered 2012-11-05 – 2012-11-06 (×2): 12.5 mg via ORAL
  Filled 2012-11-05 (×2): qty 1

## 2012-11-05 MED ORDER — SODIUM CHLORIDE 0.9 % IV SOLN: INTRAVENOUS | Status: AC

## 2012-11-05 NOTE — H&P (Signed)
Randall Lopez is an 67 y.o. male.   Chief Complaint: Left flank and abdominal pain with nausea. HPI: Patient contacted me on call with complaints of increasing abdominal pain/flank pain as well as a low-grade fever and tachycardia status post ESWL. Patient has a prior history of nephrolithiasis. He developed left flank pain was diagnosed with a 6 mm left UPJ stone. The patient also had some additional scatter bilateral renal calculi. The patient underwent ESWL approximately 3 days ago. The patient went to the emergency room where he was reassessed. He was found to have some tachycardia which responded to some IV fluids. CT imaging revealed a rather large subcapsular hematoma involving the left kidney. The patient also had some distal ureteral stone fragments with only some mild hydronephrosis. We felt would be prudent to have him admitted for pain control, observation with serial hemoglobin checks and also to be put him on empiric antibiotic therapy. He continues to have moderate discomfort with mild nausea.  Past Medical History  Diagnosis Date  . Hypertension   . GERD (gastroesophageal reflux disease)   . Gout   . History of Helicobacter pylori infection ~2004 05/02/2012  . Chronic kidney disease     kidney stone    Past Surgical History  Procedure Laterality Date  . Tonsillectomy    . Lithotripsy    . Lumbar laminectomy/decompression microdiscectomy  12/17/2011    Procedure: LUMBAR LAMINECTOMY/DECOMPRESSION MICRODISCECTOMY;  Surgeon: Venita Lick, MD;  Location: [facility];  Service: Orthopedics;  Laterality: Right;  L4-L5 Microdiscectomy  . Laparoscopic cholecystectomy single port  08/04/2012    Procedure: LAPAROSCOPIC CHOLECYSTECTOMY SINGLE PORT;  Surgeon: Ardeth Sportsman, MD;  Location: [facility];  Service: General;  Laterality: N/A;  LYSIS OF ADHESIONS with Intraoperative Cholangiogram   . Cholecystectomy    . Back surgery      Family History  Problem Relation Age of Onset  . Diabetes  Mother   . Stroke Father   . Stroke Maternal Grandmother   . Cancer Maternal Grandfather     kidney ca   Social History:  reports that he has never smoked. He has never used smokeless tobacco. He reports that he does not drink alcohol or use illicit drugs.  Allergies: No Known Allergies  Medications Prior to Admission  Medication Sig Dispense Refill  . atenolol (TENORMIN) 25 MG tablet Take 12.5 mg by mouth every morning. 1/2 tab daily      . omeprazole (PRILOSEC) 20 MG capsule Take 20 mg by mouth every morning.       Marland Kitchen oxyCODONE-acetaminophen (PERCOCET/ROXICET) 5-325 MG per tablet Take 1 tablet by mouth every 4 (four) hours as needed for pain.        Results for orders placed during the hospital encounter of 11/04/12 (from the past 48 hour(s))  CBC WITH DIFFERENTIAL     Status: Abnormal   Collection Time    11/04/12  7:48 PM      Result Value Range   WBC 13.2 (*) 4.0 - 10.5 K/uL   RBC 4.24  4.22 - 5.81 MIL/uL   Hemoglobin 12.3 (*) 13.0 - 17.0 g/dL   HCT 96.0 (*) 45.4 - 09.8 %   MCV 83.7  78.0 - 100.0 fL   MCH 29.0  26.0 - 34.0 pg   MCHC 34.6  30.0 - 36.0 g/dL   RDW 11.9  14.7 - 82.9 %   Platelets 218  150 - 400 K/uL   Neutrophils Relative 75  43 - 77 %  Neutro Abs 9.9 (*) 1.7 - 7.7 K/uL   Lymphocytes Relative 10 (*) 12 - 46 %   Lymphs Abs 1.3  0.7 - 4.0 K/uL   Monocytes Relative 14 (*) 3 - 12 %   Monocytes Absolute 1.8 (*) 0.1 - 1.0 K/uL   Eosinophils Relative 1  0 - 5 %   Eosinophils Absolute 0.1  0.0 - 0.7 K/uL   Basophils Relative 0  0 - 1 %   Basophils Absolute 0.0  0.0 - 0.1 K/uL  PRO B NATRIURETIC PEPTIDE     Status: Abnormal   Collection Time    11/04/12  7:49 PM      Result Value Range   Pro B Natriuretic peptide (BNP) 398.6 (*) 0 - 125 pg/mL  COMPREHENSIVE METABOLIC PANEL     Status: Abnormal   Collection Time    11/04/12  7:49 PM      Result Value Range   Sodium 134 (*) 135 - 145 mEq/L   Potassium 3.6  3.5 - 5.1 mEq/L   Chloride 98  96 - 112 mEq/L   CO2  27  19 - 32 mEq/L   Glucose, Bld 99  70 - 99 mg/dL   BUN 27 (*) 6 - 23 mg/dL   Creatinine, Ser 1.61 (*) 0.50 - 1.35 mg/dL   Calcium 8.9  8.4 - 09.6 mg/dL   Total Protein 6.2  6.0 - 8.3 g/dL   Albumin 3.3 (*) 3.5 - 5.2 g/dL   AST 27  0 - 37 U/L   ALT 26  0 - 53 U/L   Alkaline Phosphatase 67  39 - 117 U/L   Total Bilirubin 1.1  0.3 - 1.2 mg/dL   GFR calc non Af Amer 37 (*) >90 mL/min   GFR calc Af Amer 42 (*) >90 mL/min   Comment:            The eGFR has been calculated     using the CKD EPI equation.     This calculation has not been     validated in all clinical     situations.     eGFR's persistently     <90 mL/min signify     possible Chronic Kidney Disease.  URINALYSIS, ROUTINE W REFLEX MICROSCOPIC     Status: Abnormal   Collection Time    11/04/12  8:17 PM      Result Value Range   Color, Urine YELLOW  YELLOW   APPearance CLEAR  CLEAR   Specific Gravity, Urine 1.022  1.005 - 1.030   pH 6.5  5.0 - 8.0   Glucose, UA NEGATIVE  NEGATIVE mg/dL   Hgb urine dipstick LARGE (*) NEGATIVE   Bilirubin Urine NEGATIVE  NEGATIVE   Ketones, ur NEGATIVE  NEGATIVE mg/dL   Protein, ur NEGATIVE  NEGATIVE mg/dL   Urobilinogen, UA 1.0  0.0 - 1.0 mg/dL   Nitrite NEGATIVE  NEGATIVE   Leukocytes, UA NEGATIVE  NEGATIVE  URINE MICROSCOPIC-ADD ON     Status: None   Collection Time    11/04/12  8:17 PM      Result Value Range   WBC, UA 0-2  <3 WBC/hpf   RBC / HPF 3-6  <3 RBC/hpf   Bacteria, UA RARE  RARE  CBC     Status: Abnormal   Collection Time    11/05/12  4:45 AM      Result Value Range   WBC 13.6 (*) 4.0 - 10.5 K/uL   RBC  3.85 (*) 4.22 - 5.81 MIL/uL   Hemoglobin 11.3 (*) 13.0 - 17.0 g/dL   HCT 16.1 (*) 09.6 - 04.5 %   MCV 83.6  78.0 - 100.0 fL   MCH 29.4  26.0 - 34.0 pg   MCHC 35.1  30.0 - 36.0 g/dL   RDW 40.9  81.1 - 91.4 %   Platelets 192  150 - 400 K/uL  BASIC METABOLIC PANEL     Status: Abnormal   Collection Time    11/05/12  4:45 AM      Result Value Range   Sodium 135   135 - 145 mEq/L   Potassium 3.5  3.5 - 5.1 mEq/L   Chloride 101  96 - 112 mEq/L   CO2 26  19 - 32 mEq/L   Glucose, Bld 112 (*) 70 - 99 mg/dL   BUN 24 (*) 6 - 23 mg/dL   Creatinine, Ser 7.82 (*) 0.50 - 1.35 mg/dL   Calcium 8.2 (*) 8.4 - 10.5 mg/dL   GFR calc non Af Amer 43 (*) >90 mL/min   GFR calc Af Amer 50 (*) >90 mL/min   Comment:            The eGFR has been calculated     using the CKD EPI equation.     This calculation has not been     validated in all clinical     situations.     eGFR's persistently     <90 mL/min signify     possible Chronic Kidney Disease.   Ct Abdomen Pelvis Wo Contrast  11/04/2012  *RADIOLOGY REPORT*  Clinical Data: Left flank pain and fever.  Recent lithotripsy.  CT ABDOMEN AND PELVIS WITHOUT CONTRAST  Technique:  Multidetector CT imaging of the abdomen and pelvis was performed following the standard protocol without intravenous contrast.  Comparison: None.  Findings: Atelectasis in both lung bases.  Small left pleural effusion.  The left kidney is enlarged and contains a posterior and lateral subcapsular hematoma extend to the inferior kidney.  The hematoma measures up to about 8.6 x 8.2 x 3.6 cm.  There is surrounding infiltration in the left pararenal fat and extending down along the left pericolic gutter to the pelvis.  This is likely representing a hemorrhage or edema.  Small amount of free fluid in the pelvis anterior to the sacrum likely represents extension of this process. There is mild pyelocaliectasis on the left.  Mild ureterectasis on the left.  Multiple stone fragments are demonstrated in the left distal ureter at and just above the ureterovesicle junction. Multiple residual intrarenal stones on the left, largest measuring 4.5 mm.  The bladder wall is thickened which may be due to cystitis or under distension.  Calcification of the prostate gland.  The right ureter and renal collecting system are not distended.  There are multiple stones in the right  kidney, largest measuring about 5 mm diameter.  Surgical absence of the gallbladder.  The unenhanced appearance of the liver, spleen, pancreas, adrenal glands, inferior vena cava, and retroperitoneal lymph nodes is unremarkable.  Calcification of the aorta without aneurysm.  The stomach and small bowel are mostly decompressed.  Stool and gas filled colon without significant distension.  No free air or free fluid in the abdomen.  Pelvis:  In addition to precede scrub findings, there is no evidence of loculated fluid collection.  The appendix is not identified.  No evidence of diverticulitis.  Rectosigmoid colon are decompressed.  Mild degenerative changes in  the lumbar spine.  IMPRESSION: There is a large subcapsular hematoma in the left kidney with hemorrhage or edema infiltrating the left pararenal fat, retroperitoneal fat, left pelvic fat, and pelvis.  Small amount of free fluid in the low pelvis.  Bilateral intrarenal stones. Multiple stone fragments demonstrated in the distal left ureter with proximal ureterectasis and pyelocaliectasis.  Small left pleural effusion.  Atelectasis in both lung bases.   Original Report Authenticated By: Burman Nieves, M.D.     Review of Systems - Negative except abdominal discomfort, left flank pain mild nausea  Blood pressure 125/61, pulse 99, temperature 100.8 F (38.2 C), temperature source Oral, resp. rate 20, height 5\' 9"  (1.753 m), weight 84.188 kg (185 lb 9.6 oz), SpO2 98.00%. General appearance: alert, cooperative and no distress Neck: no adenopathy and no JVD Resp: clear to auscultation bilaterally Cardio: regular rate and rhythm GI: soft, some left flank tenderness. Male genitalia: normal, penis: no lesions or discharge. testes: no masses or tenderness. no hernias Extremities: extremities normal, atraumatic, no cyanosis or edema Skin: Skin color, texture, turgor normal. No rashes or lesions Neurologic: Grossly normal  Assessment/Plan Subcapsular  hematoma status post ESWL. Patient also has some distal ureteral stone fragments. We will continue with supportive care with IV hydration, Rocephin, pain medication, serial hemoglobin checks, Flomax to help facilitate stone fragment passage  Aster Screws S 11/05/2012, 8:14 AM

## 2012-11-05 NOTE — Progress Notes (Signed)
Utilization review completed.  

## 2012-11-06 LAB — CBC
MCH: 29.6 pg (ref 26.0–34.0)
MCHC: 35.3 g/dL (ref 30.0–36.0)
MCV: 83.9 fL (ref 78.0–100.0)
Platelets: 210 10*3/uL (ref 150–400)
RDW: 12.5 % (ref 11.5–15.5)

## 2012-11-06 LAB — BASIC METABOLIC PANEL
CO2: 25 mEq/L (ref 19–32)
Calcium: 8.4 mg/dL (ref 8.4–10.5)
Creatinine, Ser: 1.52 mg/dL — ABNORMAL HIGH (ref 0.50–1.35)
GFR calc non Af Amer: 46 mL/min — ABNORMAL LOW (ref >90)
Glucose, Bld: 122 mg/dL — ABNORMAL HIGH (ref 70–99)

## 2012-11-06 MED ORDER — SENNOSIDES-DOCUSATE SODIUM 8.6-50 MG PO TABS
1.0000 | ORAL_TABLET | Freq: Two times a day (BID) | ORAL | Status: DC
  Administered 2012-11-06: 1 via ORAL
  Filled 2012-11-06 (×2): qty 1

## 2012-11-06 MED ORDER — SULFAMETHOXAZOLE-TMP DS 800-160 MG PO TABS
1.0000 | ORAL_TABLET | Freq: Every day | ORAL | Status: DC
  Administered 2012-11-06: 1 via ORAL
  Filled 2012-11-06: qty 1

## 2012-11-06 MED ORDER — TAMSULOSIN HCL 0.4 MG PO CAPS: 0.4000 mg | ORAL_CAPSULE | Freq: Every day | ORAL | Status: DC

## 2012-11-06 MED ORDER — SULFAMETHOXAZOLE-TMP DS 800-160 MG PO TABS: 1.0000 | ORAL_TABLET | Freq: Every day | ORAL | Status: DC

## 2012-11-06 MED ORDER — BISACODYL 10 MG RE SUPP
10.0000 mg | Freq: Once | RECTAL | Status: AC
  Administered 2012-11-06: 10 mg via RECTAL
  Filled 2012-11-06: qty 1

## 2012-11-06 NOTE — Progress Notes (Signed)
Subjective:  1 - Left Subcapsular Renal Hematoma - s/p SWL for left UPJ stone 11/02/12. Found tohave subcapsular hematoma by CT from ER 4/5 on w/u for flank pain. Hgb stable  2 - Left Ureteral Stones - s/p SWL for UPJ stone, ER CT with progression of fragemnts to UVJ area. Has passed some fragemnts, minimal colic.  Today Randall Lopez is w/o complaints. He is keeping things down, ambulatory, and pain controlled. No high-grade fevers.   Objective: Vital signs in last 24 hours: Temp:  [98.3 F (36.8 C)-100.1 F (37.8 C)] 98.3 F (36.8 C) (04/07 0640) Pulse Rate:  [91-108] 94 (04/07 0640) Resp:  [20] 20 (04/07 0640) BP: (118-137)/(62-68) 134/65 mmHg (04/07 0640) SpO2:  [92 %-98 %] 95 % (04/07 0640) Last BM Date: 11/01/12  Intake/Output from previous day: 04/06 0701 - 04/07 0700 In: 2470 [P.O.:960; I.V.:1460; IV Piggyback:50] Out: 1750 [Urine:1750] Intake/Output this shift:    General appearance: alert, cooperative, appears stated age and wife at bedside Head: Normocephalic, without obvious abnormality, atraumatic Eyes: conjunctivae/corneas clear. PERRL, EOM's intact. Fundi benign. Ears: normal TM's and external ear canals both ears Nose: Nares normal. Septum midline. Mucosa normal. No drainage or sinus tenderness. Throat: lips, mucosa, and tongue normal; teeth and gums normal Neck: no adenopathy, no carotid bruit, no JVD, supple, symmetrical, trachea midline and thyroid not enlarged, symmetric, no tenderness/mass/nodules Back: symmetric, no curvature. ROM normal. No CVA tenderness. Resp: clear to auscultation bilaterally Cardio: regular rate and rhythm, S1, S2 normal, no murmur, click, rub or gallop GI: soft, non-tender; bowel sounds normal; no masses,  no organomegaly Male genitalia: normal Extremities: extremities normal, atraumatic, no cyanosis or edema Pulses: 2+ and symmetric Skin: Skin color, texture, turgor normal. No rashes or lesions Lymph nodes: Cervical, supraclavicular,  and axillary nodes normal. Neurologic: Alert and oriented X 3, normal strength and tone. Normal symmetric reflexes. Normal coordination and gait Incision/Wound: Left flank w/o large eccymosis. Small SWL head petechaie only.  Lab Results:   Recent Labs  11/05/12 0445 11/05/12 1829 11/06/12 0426  WBC 13.6*  --  11.9*  HGB 11.3* 12.2* 11.0*  HCT 32.2* 35.7* 31.2*  PLT 192  --  210   BMET  Recent Labs  11/05/12 0445 11/06/12 0426  NA 135 132*  K 3.5 3.5  CL 101 99  CO2 26 25  GLUCOSE 112* 122*  BUN 24* 20  CREATININE 1.61* 1.52*  CALCIUM 8.2* 8.4   PT/INR No results found for this basename: LABPROT, INR,  in the last 72 hours ABG No results found for this basename: PHART, PCO2, PO2, HCO3,  in the last 72 hours  Studies/Results: Ct Abdomen Pelvis Wo Contrast  11/04/2012  *RADIOLOGY REPORT*  Clinical Data: Left flank pain and fever.  Recent lithotripsy.  CT ABDOMEN AND PELVIS WITHOUT CONTRAST  Technique:  Multidetector CT imaging of the abdomen and pelvis was performed following the standard protocol without intravenous contrast.  Comparison: None.  Findings: Atelectasis in both lung bases.  Small left pleural effusion.  The left kidney is enlarged and contains a posterior and lateral subcapsular hematoma extend to the inferior kidney.  The hematoma measures up to about 8.6 x 8.2 x 3.6 cm.  There is surrounding infiltration in the left pararenal fat and extending down along the left pericolic gutter to the pelvis.  This is likely representing a hemorrhage or edema.  Small amount of free fluid in the pelvis anterior to the sacrum likely represents extension of this process. There is mild pyelocaliectasis on  the left.  Mild ureterectasis on the left.  Multiple stone fragments are demonstrated in the left distal ureter at and just above the ureterovesicle junction. Multiple residual intrarenal stones on the left, largest measuring 4.5 mm.  The bladder wall is thickened which may be due to  cystitis or under distension.  Calcification of the prostate gland.  The right ureter and renal collecting system are not distended.  There are multiple stones in the right kidney, largest measuring about 5 mm diameter.  Surgical absence of the gallbladder.  The unenhanced appearance of the liver, spleen, pancreas, adrenal glands, inferior vena cava, and retroperitoneal lymph nodes is unremarkable.  Calcification of the aorta without aneurysm.  The stomach and small bowel are mostly decompressed.  Stool and gas filled colon without significant distension.  No free air or free fluid in the abdomen.  Pelvis:  In addition to precede scrub findings, there is no evidence of loculated fluid collection.  The appendix is not identified.  No evidence of diverticulitis.  Rectosigmoid colon are decompressed.  Mild degenerative changes in the lumbar spine.  IMPRESSION: There is a large subcapsular hematoma in the left kidney with hemorrhage or edema infiltrating the left pararenal fat, retroperitoneal fat, left pelvic fat, and pelvis.  Small amount of free fluid in the low pelvis.  Bilateral intrarenal stones. Multiple stone fragments demonstrated in the distal left ureter with proximal ureterectasis and pyelocaliectasis.  Small left pleural effusion.  Atelectasis in both lung bases.   Original Report Authenticated By: Burman Nieves, M.D.     Anti-infectives: Anti-infectives   Start     Dose/Rate Route Frequency Ordered Stop   11/06/12 1000  sulfamethoxazole-trimethoprim (BACTRIM DS) 800-160 MG per tablet 1 tablet     1 tablet Oral Daily 11/06/12 0750     11/05/12 0245  cefTRIAXone (ROCEPHIN) 1 g in dextrose 5 % 50 mL IVPB  Status:  Discontinued     1 g 100 mL/hr over 30 Minutes Intravenous Every 24 hours 11/05/12 0234 11/06/12 0750   11/04/12 2300  ciprofloxacin (CIPRO) IVPB 400 mg  Status:  Discontinued     400 mg 200 mL/hr over 60 Minutes Intravenous  Once 11/04/12 2256 11/04/12 2257   11/04/12 2300   cefTRIAXone (ROCEPHIN) 1 g in dextrose 5 % 50 mL IVPB     1 g 100 mL/hr over 30 Minutes Intravenous  Once 11/04/12 2257 11/05/12 0030      Assessment/Plan:  1 - Left Subcapsular Renal Hematoma - Hgb approx stable, pain controlled. Will begin proph bactrim daily x 4 weeks to prevent infection of this while resolving.   2 - Left Ureteral Stones - Pain controlled. Discussed continued medical expulsive therapy of fragments v. Stent / URS. He wants medical therapy. This is reasonable based on size / location of stones as well as lack of overt infectious paramaters and lack of urinoma.   3 - Disposition - Possible DC later today if does well off IVF. Also Ducolax spp x1.   [facility], Kiannah Grunow 11/06/2012

## 2012-11-06 NOTE — Discharge Summary (Signed)
Physician Discharge Summary  Patient ID: Randall Lopez MRN: [id number] DOB/AGE: 06-May-1946 67 y.o.  Admit date: 11/04/2012 Discharge date: 11/06/2012  Admission Diagnoses: Left Subcapsular Renal Hematoma, Flank Pain  Discharge Diagnoses: Left Subcapsular Renal Hematoma, Flank Pain   Discharged Condition: good  Hospital Course:   1 - Left Subcapsular Renal Hematoma - s/p SWL for left UPJ stone 11/02/12. Found to have subcapsular hematoma by CT from ER 4/5 on w/u for flank pain post-op. Hgb stable at discharge from admission in 11 range with check of serial Hgb's.  2 - Left Ureteral Stones - s/p SWL for UPJ stone, ER CT with progression of fragemnts to UVJ area. Has passed some fragemnts, minimal colic. Started on tamsulosin in house to help promote stone passage. He was offered surgery with stenting to help pass fragments, but he has opted for continued medical therapy.    Consults: None  Significant Diagnostic Studies: CT 11/04/12 with findings as per above  Treatments: IV hydration and analgesia. Serial Hgb.  Discharge Exam: Blood pressure 131/64, pulse 95, temperature 98.3 F (36.8 C), temperature source Oral, resp. rate 20, height 5\' 9"  (1.753 m), weight 84.188 kg (185 lb 9.6 oz), SpO2 96.00%. General appearance: alert, cooperative, appears stated age and wife at bedside Head: Normocephalic, without obvious abnormality, atraumatic Eyes: conjunctivae/corneas clear. PERRL, EOM's intact. Fundi benign. Ears: normal TM's and external ear canals both ears Nose: Nares normal. Septum midline. Mucosa normal. No drainage or sinus tenderness. Throat: lips, mucosa, and tongue normal; teeth and gums normal Neck: no adenopathy, no carotid bruit, no JVD, supple, symmetrical, trachea midline and thyroid not enlarged, symmetric, no tenderness/mass/nodules Back: symmetric, no curvature. ROM normal. No CVA tenderness. Resp: clear to auscultation bilaterally Cardio: regular rate and rhythm, S1, S2  normal, no murmur, click, rub or gallop GI: soft, non-tender; bowel sounds normal; no masses,  no organomegaly Male genitalia: normal Extremities: extremities normal, atraumatic, no cyanosis or edema Pulses: 2+ and symmetric Skin: Skin color, texture, turgor normal. No rashes or lesions Lymph nodes: Cervical, supraclavicular, and axillary nodes normal. Neurologic: Grossly normal Incision/Wound: Left flank with tiny petechaie only. No ecchymoses.  Disposition: 01-Home or Self Care     Medication List    STOP taking these medications       aspirin EC 81 MG tablet     folic acid 400 MCG tablet  Commonly known as:  FOLVITE      TAKE these medications       atenolol 25 MG tablet  Commonly known as:  TENORMIN  Take 12.5 mg by mouth every morning. 1/2 tab daily     omeprazole 20 MG capsule  Commonly known as:  PRILOSEC  Take 20 mg by mouth every morning.     oxyCODONE-acetaminophen 5-325 MG per tablet  Commonly known as:  PERCOCET/ROXICET  Take 1 tablet by mouth every 4 (four) hours as needed for pain.     sulfamethoxazole-trimethoprim 800-160 MG per tablet  Commonly known as:  BACTRIM DS  Take 1 tablet by mouth daily. To prevent hematoma infection.     tamsulosin 0.4 MG Caps  Commonly known as:  FLOMAX  Take 1 capsule (0.4 mg total) by mouth daily. To promote kidney stone fragement passage.           Follow-up Information   Follow up with Sebastian Ache, MD. (as scheduled for X-Rays and MD visit)    Contact information:   [address] [place] [address] [phone]  SignedSebastian Ache 11/06/2012, 4:49 PM

## 2013-03-07 ENCOUNTER — Other Ambulatory Visit: Payer: Self-pay

## 2013-06-07 ENCOUNTER — Other Ambulatory Visit: Payer: Self-pay

## 2013-08-17 IMAGING — CR DG KNEE 1-2V*R*
2 series · 2 of 2 positions shown · non-contrast
Comparison: None.

CLINICAL DATA: Right knee pain, swelling, no injury

RIGHT KNEE - 1-2 VIEW

[w knee ap right]
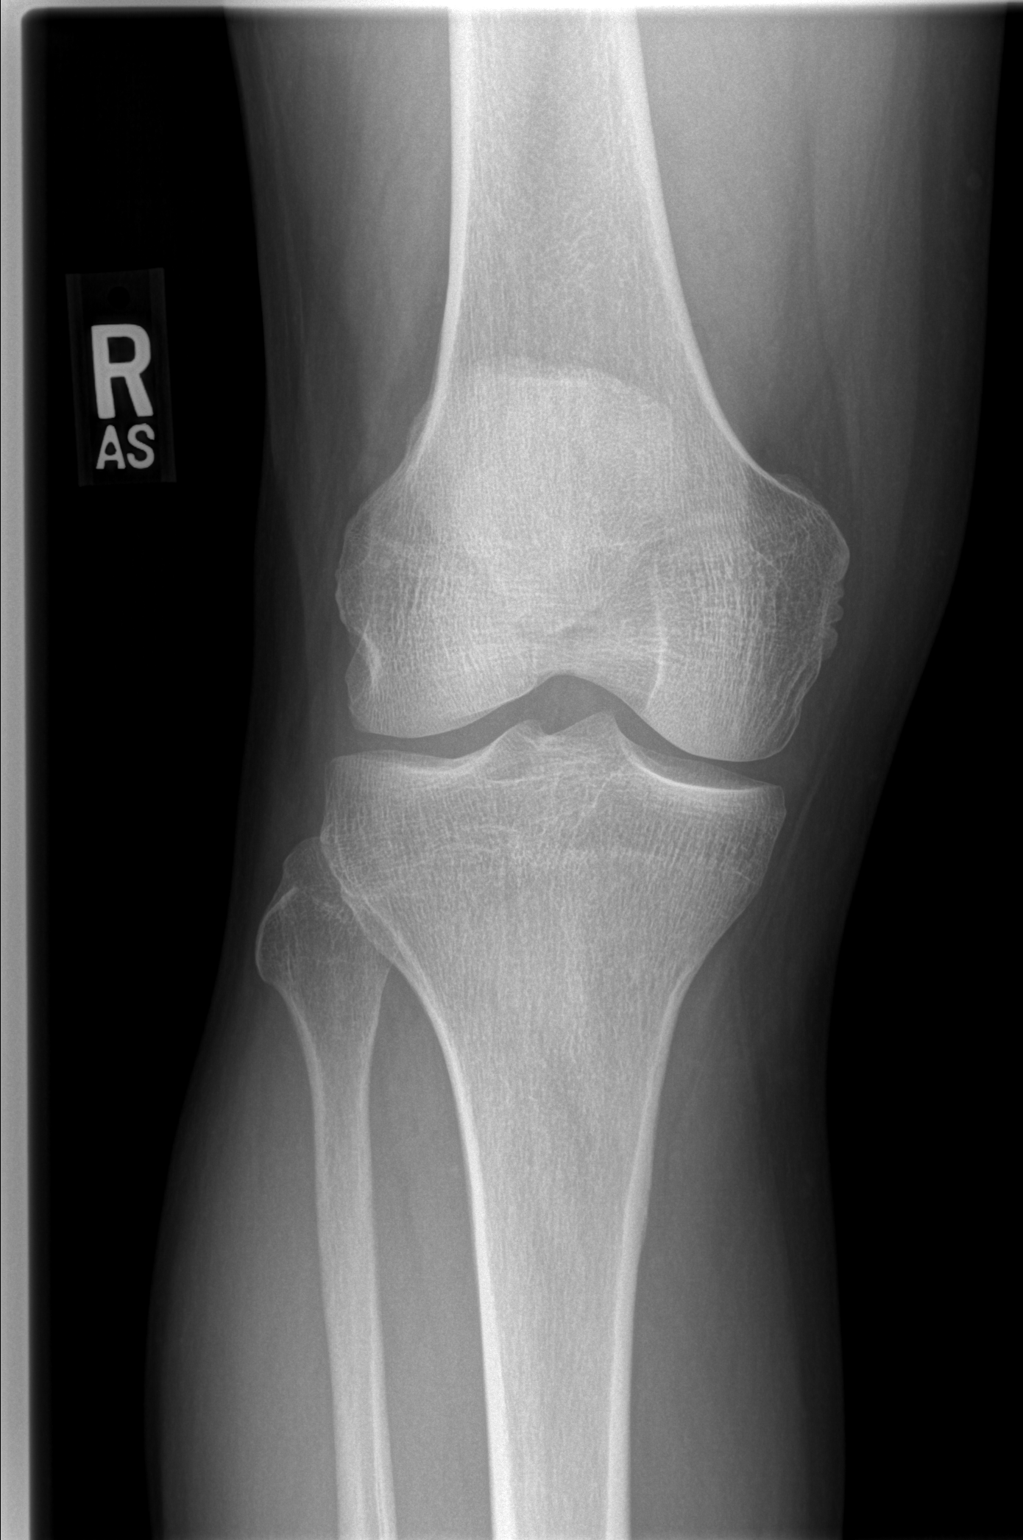

[w knee lat. right]
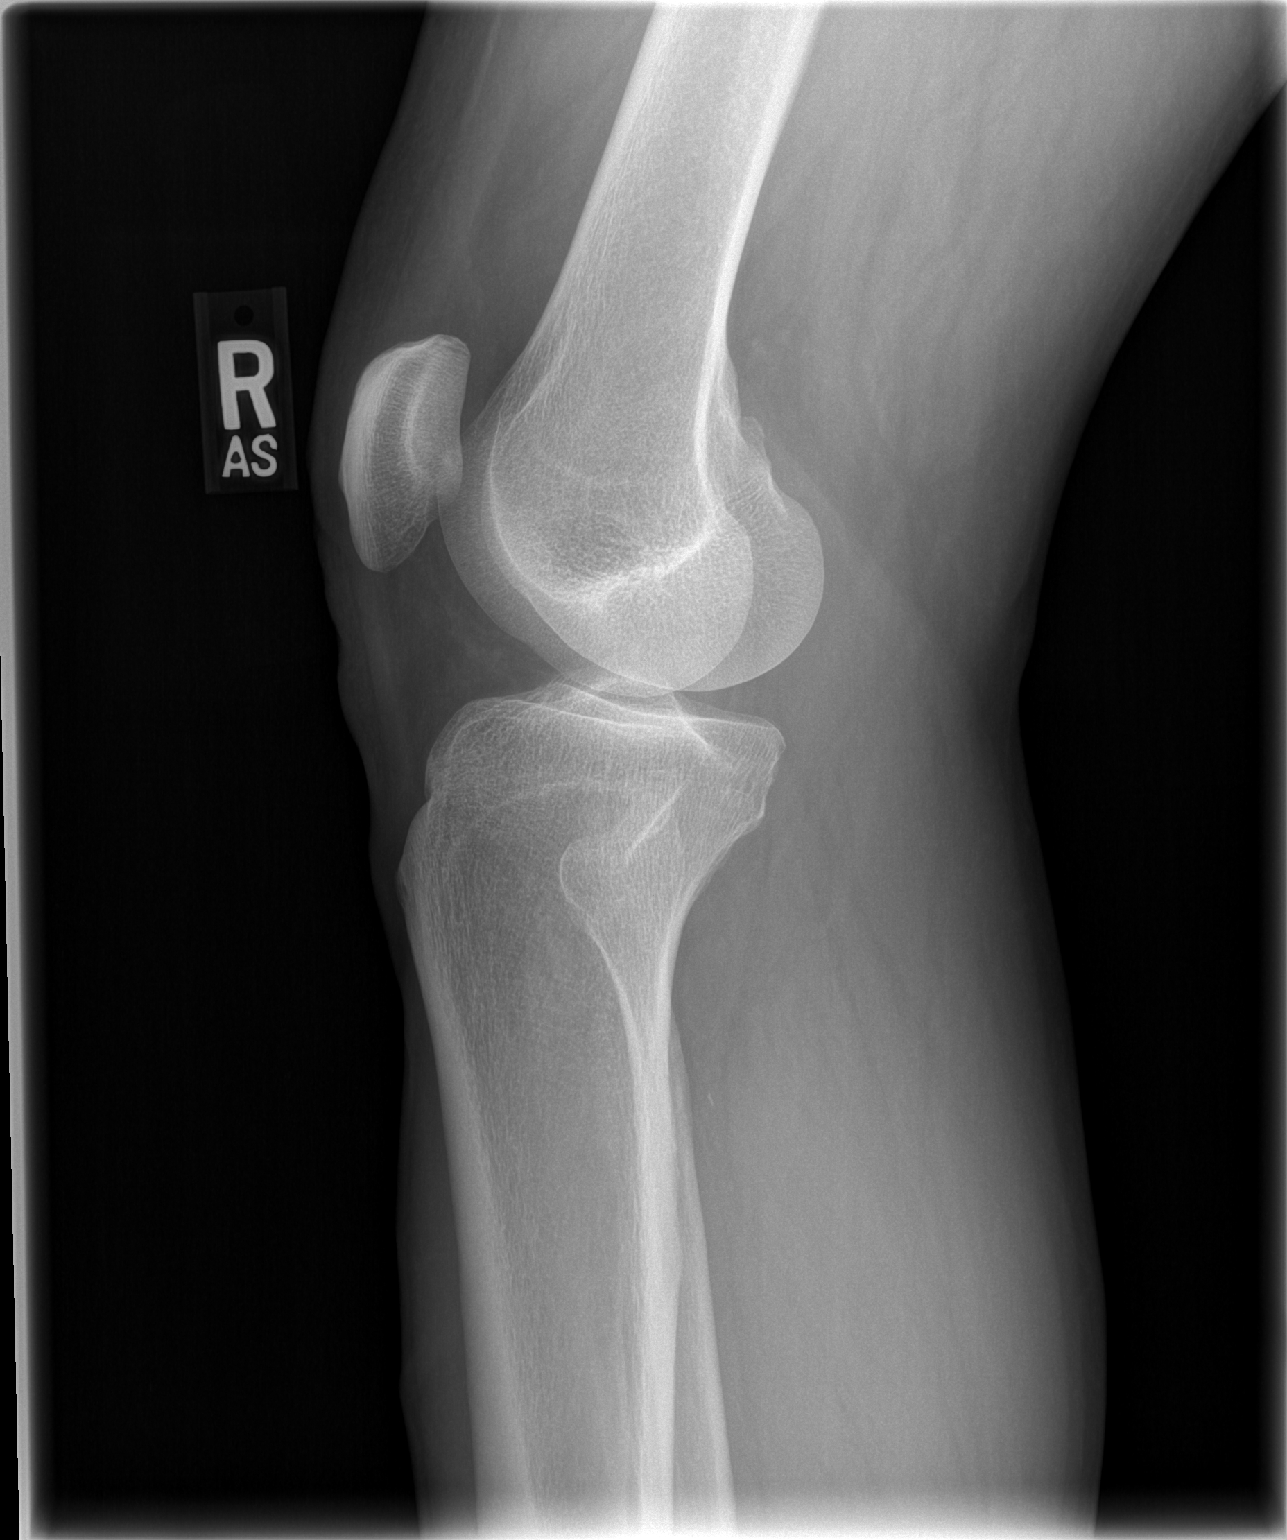

[2 of 2 positions shown; findings below may reference images not displayed]

FINDINGS: Standing views of the knees were obtained.  The knee
joint spaces are relatively normal.  No fracture is seen.  No
effusion is noted.
IMPRESSION: Negative.

## 2013-08-25 IMAGING — RF DG C-ARM 1-60 MIN-NO REPORT
1 series · 1 of 1 positions shown · non-contrast
Comparison: Lumbar spine MRI 12/16/2011

CLINICAL DATA: Lumbar laminectomy L4-L5

DG C-ARM 1-60 MIN - [facility],LUMBAR SPINE - 2-3 VIEW

[Series 1: run · 1 of 1 slices shown]
[im 1/1]
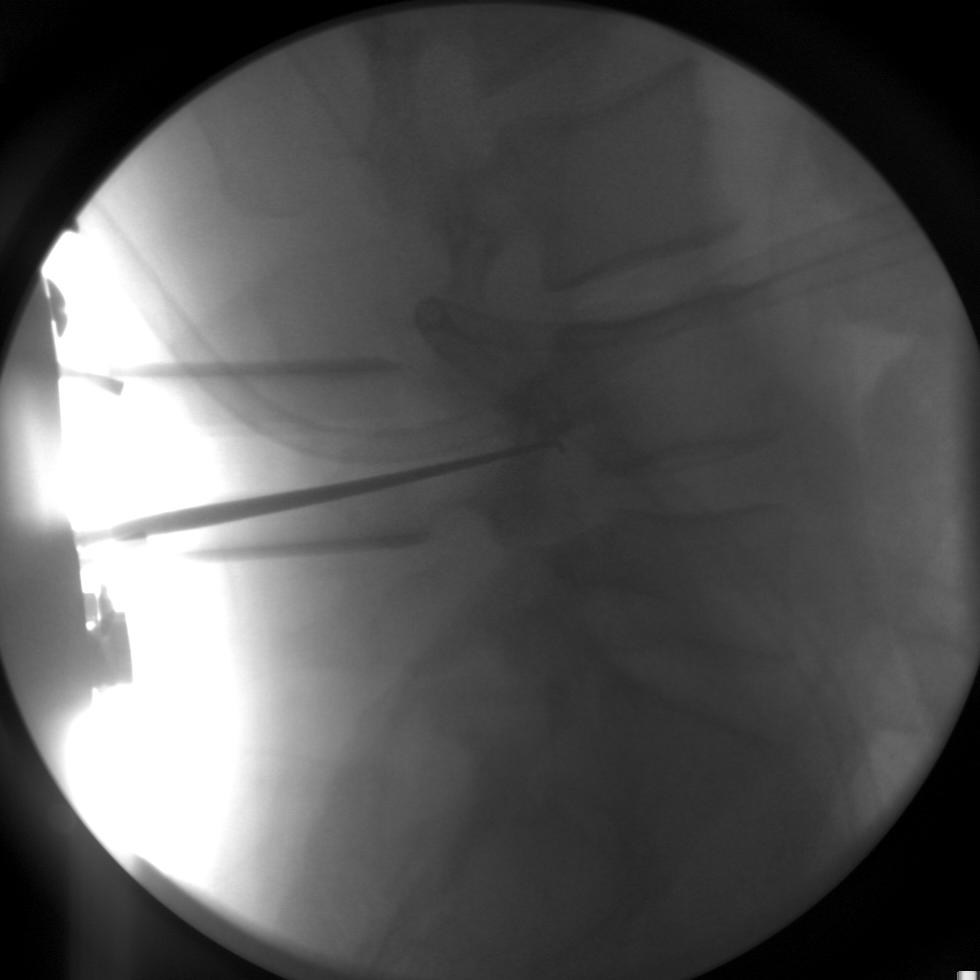

[1 of 1 positions shown; findings below may reference images not displayed]

FINDINGS: Numbering is as per prior exam.  A single intraoperative
image demonstrate a metallic probe posterior to the inferior aspect
of the L4 vertebral body.
IMPRESSION: Intraoperative localization as above.

## 2014-04-12 ENCOUNTER — Encounter: Payer: Self-pay | Admitting: *Deleted

## 2014-10-09 ENCOUNTER — Encounter (HOSPITAL_COMMUNITY): Payer: Self-pay | Admitting: *Deleted

## 2014-10-09 ENCOUNTER — Other Ambulatory Visit: Payer: Self-pay | Admitting: Gastroenterology

## 2014-10-14 ENCOUNTER — Ambulatory Visit (HOSPITAL_COMMUNITY): Payer: Medicare Other | Admitting: Anesthesiology

## 2014-10-14 ENCOUNTER — Encounter (HOSPITAL_COMMUNITY)
Admission: RE | Disposition: A | Discharge status: Home / Self Care | Payer: Self-pay | Source: Ambulatory Visit | Attending: Gastroenterology

## 2014-10-14 ENCOUNTER — Ambulatory Visit (HOSPITAL_COMMUNITY)
Admission: RE | Admit: 2014-10-14 | Discharge: 2014-10-14 | Disposition: A | Discharge status: Home / Self Care | Payer: Medicare Other | Source: Ambulatory Visit | Attending: Gastroenterology | Admitting: Gastroenterology

## 2014-10-14 ENCOUNTER — Encounter (HOSPITAL_COMMUNITY): Payer: Self-pay

## 2014-10-14 DIAGNOSIS — K633 Ulcer of intestine: Secondary | ICD-10-CM | POA: Insufficient documentation

## 2014-10-14 DIAGNOSIS — D122 Benign neoplasm of ascending colon: Secondary | ICD-10-CM | POA: Diagnosis not present

## 2014-10-14 DIAGNOSIS — K626 Ulcer of anus and rectum: Secondary | ICD-10-CM | POA: Diagnosis not present

## 2014-10-14 DIAGNOSIS — Z1211 Encounter for screening for malignant neoplasm of colon: Secondary | ICD-10-CM | POA: Diagnosis not present

## 2014-10-14 DIAGNOSIS — Z79899 Other long term (current) drug therapy: Secondary | ICD-10-CM | POA: Insufficient documentation

## 2014-10-14 DIAGNOSIS — Z87442 Personal history of urinary calculi: Secondary | ICD-10-CM | POA: Insufficient documentation

## 2014-10-14 DIAGNOSIS — Z9049 Acquired absence of other specified parts of digestive tract: Secondary | ICD-10-CM | POA: Diagnosis not present

## 2014-10-14 DIAGNOSIS — D124 Benign neoplasm of descending colon: Secondary | ICD-10-CM | POA: Insufficient documentation

## 2014-10-14 DIAGNOSIS — E780 Pure hypercholesterolemia: Secondary | ICD-10-CM | POA: Diagnosis not present

## 2014-10-14 DIAGNOSIS — M109 Gout, unspecified: Secondary | ICD-10-CM | POA: Insufficient documentation

## 2014-10-14 DIAGNOSIS — E785 Hyperlipidemia, unspecified: Secondary | ICD-10-CM | POA: Insufficient documentation

## 2014-10-14 DIAGNOSIS — D123 Benign neoplasm of transverse colon: Secondary | ICD-10-CM | POA: Diagnosis not present

## 2014-10-14 DIAGNOSIS — K219 Gastro-esophageal reflux disease without esophagitis: Secondary | ICD-10-CM | POA: Diagnosis not present

## 2014-10-14 DIAGNOSIS — I10 Essential (primary) hypertension: Secondary | ICD-10-CM | POA: Diagnosis not present

## 2014-10-14 HISTORY — PX: COLONOSCOPY WITH PROPOFOL: SHX5780

## 2014-10-14 HISTORY — DX: Personal history of urinary calculi: Z87.442

## 2014-10-14 SURGERY — COLONOSCOPY WITH PROPOFOL
Anesthesia: Monitor Anesthesia Care

## 2014-10-14 MED ORDER — PROPOFOL INFUSION 10 MG/ML OPTIME
INTRAVENOUS | Status: DC | PRN
  Administered 2014-10-14: 200 ug/kg/min via INTRAVENOUS

## 2014-10-14 MED ORDER — PROPOFOL 10 MG/ML IV BOLUS
INTRAVENOUS | Status: AC
  Filled 2014-10-14: qty 20

## 2014-10-14 MED ORDER — METOCLOPRAMIDE HCL 5 MG/ML IJ SOLN
INTRAMUSCULAR | Status: AC
  Filled 2014-10-14: qty 2

## 2014-10-14 MED ORDER — LIDOCAINE HCL (CARDIAC) 20 MG/ML IV SOLN
INTRAVENOUS | Status: AC
  Filled 2014-10-14: qty 5

## 2014-10-14 MED ORDER — SODIUM CHLORIDE 0.9 % IV SOLN: INTRAVENOUS | Status: DC

## 2014-10-14 MED ORDER — LIDOCAINE HCL (CARDIAC) 20 MG/ML IV SOLN
INTRAVENOUS | Status: DC | PRN
  Administered 2014-10-14: 50 mg via INTRAVENOUS

## 2014-10-14 MED ORDER — KETAMINE HCL 10 MG/ML IJ SOLN
INTRAMUSCULAR | Status: DC | PRN
  Administered 2014-10-14: 10 mg via INTRAVENOUS

## 2014-10-14 MED ORDER — PROPOFOL 10 MG/ML IV BOLUS
INTRAVENOUS | Status: DC | PRN
  Administered 2014-10-14 (×2): 10 mg via INTRAVENOUS
  Administered 2014-10-14: 50 mg via INTRAVENOUS
  Administered 2014-10-14: 20 mg via INTRAVENOUS

## 2014-10-14 MED ORDER — METOCLOPRAMIDE HCL 5 MG/ML IJ SOLN
INTRAMUSCULAR | Status: DC | PRN
  Administered 2014-10-14: 10 mg via INTRAVENOUS

## 2014-10-14 MED ORDER — LACTATED RINGERS IV SOLN
INTRAVENOUS | Status: DC
  Administered 2014-10-14: 1000 mL via INTRAVENOUS

## 2014-10-14 SURGICAL SUPPLY — 21 items

## 2014-10-14 NOTE — Discharge Instructions (Signed)
Colonoscopy, Care After °Refer to this sheet in the next few weeks. These instructions provide you with information on caring for yourself after your procedure. Your health care provider may also give you more specific instructions. Your treatment has been planned according to current medical practices, but problems sometimes occur. Call your health care provider if you have any problems or questions after your procedure. °WHAT TO EXPECT AFTER THE PROCEDURE  °After your procedure, it is typical to have the following: °· A small amount of blood in your stool. °· Moderate amounts of gas and mild abdominal cramping or bloating. °HOME CARE INSTRUCTIONS °· Do not drive, operate machinery, or sign important documents for 24 hours. °· You may shower and resume your regular physical activities, but move at a slower pace for the first 24 hours. °· Take frequent rest periods for the first 24 hours. °· Walk around or put a warm pack on your abdomen to help reduce abdominal cramping and bloating. °· Drink enough fluids to keep your urine clear or pale yellow. °· You may resume your normal diet as instructed by your health care provider. Avoid heavy or fried foods that are hard to digest. °· Avoid drinking alcohol for 24 hours or as instructed by your health care provider. °· Only take over-the-counter or prescription medicines as directed by your health care provider. °· If a tissue sample (biopsy) was taken during your procedure: °¨ Do not take aspirin or blood thinners for 7 days, or as instructed by your health care provider. °¨ Do not drink alcohol for 7 days, or as instructed by your health care provider. °¨ Eat soft foods for the first 24 hours. °SEEK MEDICAL CARE IF: °You have persistent spotting of blood in your stool 2-3 days after the procedure. °SEEK IMMEDIATE MEDICAL CARE IF: °· You have more than a small spotting of blood in your stool. °· You pass large blood clots in your stool. °· Your abdomen is swollen  (distended). °· You have nausea or vomiting. °· You have a fever. °· You have increasing abdominal pain that is not relieved with medicine. °Document Released: 03/02/2004 Document Revised: 05/09/2013 Document Reviewed: 03/26/2013 °Exit[employer]® Patient Information ©2015 [employer]. This information is not intended to replace advice given to you by your health care provider. Make sure you discuss any questions you have with your health care provider. ° °Conscious Sedation, Adult, Care After °Refer to this sheet in the next few weeks. These instructions provide you with information on caring for yourself after your procedure. Your health care provider may also give you more specific instructions. Your treatment has been planned according to current medical practices, but problems sometimes occur. Call your health care provider if you have any problems or questions after your procedure. °WHAT TO EXPECT AFTER THE PROCEDURE  °After your procedure: °· You may feel sleepy, clumsy, and have poor balance for several hours. °· Vomiting may occur if you eat too soon after the procedure. °HOME CARE INSTRUCTIONS °· Do not participate in any activities where you could become injured for at least 24 hours. Do not: °¨ Drive. °¨ Swim. °¨ Ride a bicycle. °¨ Operate heavy machinery. °¨ Cook. °¨ Use power tools. °¨ Climb ladders. °¨ Work from a high place. °· Do not make important decisions or sign legal documents until you are improved. °· If you vomit, drink water, juice, or soup when you can drink without vomiting. Make sure you have little or no nausea before eating solid foods. °·   Only take over-the-counter or prescription medicines for pain, discomfort, or fever as directed by your health care provider. °· Make sure you and your family fully understand everything about the medicines given to you, including what side effects may occur. °· You should not drink alcohol, take sleeping pills, or take medicines that cause drowsiness for  at least 24 hours. °· If you smoke, do not smoke without supervision. °· If you are feeling better, you may resume normal activities 24 hours after you were sedated. °· Keep all appointments with your health care provider. °SEEK MEDICAL CARE IF: °· Your skin is pale or bluish in color. °· You continue to feel nauseous or vomit. °· Your pain is getting worse and is not helped by medicine. °· You have bleeding or swelling. °· You are still sleepy or feeling clumsy after 24 hours. °SEEK IMMEDIATE MEDICAL CARE IF: °· You develop a rash. °· You have difficulty breathing. °· You develop any type of allergic problem. °· You have a fever. °MAKE SURE YOU: °· Understand these instructions. °· Will watch your condition. °· Will get help right away if you are not doing well or get worse. °Document Released: 05/09/2013 Document Reviewed: 05/09/2013 °Exit[employer]® Patient Information ©2015 [employer]. This information is not intended to replace advice given to you by your health care provider. Make sure you discuss any questions you have with your health care provider. ° ° °

## 2014-10-14 NOTE — Transfer of Care (Signed)
Immediate Anesthesia Transfer of Care Note  Patient: Randall Lopez  Procedure(s) Performed: Procedure(s): COLONOSCOPY WITH PROPOFOL (N/A)  Patient Location: Endo Recovery  Anesthesia Type:MAC  Level of Consciousness: Patient easily awoken, sedated, comfortable, cooperative, following commands, responds to stimulation.   Airway & Oxygen Therapy: Patient spontaneously breathing, ventilating well, oxygen via simple oxygen mask.  Post-op Assessment: Report given to PACU RN, vital signs reviewed and stable, moving all extremities.   Post vital signs: Reviewed and stable.  Complications: No apparent anesthesia complications

## 2014-10-14 NOTE — Op Note (Signed)
Procedure: Surveillance colonoscopy. 09/23/2008 colonoscopy with removal of a 4 mm adenomatous descending colon polyp  Endoscopist: Earle Gell  Premedication: Propofol administered by anesthesia  Procedure: The patient was placed in the left lateral decubitus position. Anal inspection and digital rectal exam were normal. The Pentax pediatric colonoscope was introduced into the rectum and advanced to the cecum. A normal-appearing appendiceal orifice was identified. A normal-appearing ileocecal valve was identified. Colonic preparation for the exam today was good. Withdrawal time was 14 minutes  Rectum. There were a few scattered shallow 1-2 millimeters sized aphthous ulcers with otherwise normal rectal mucosa. Retroflexed view of the distal rectum was normal.  Sigmoid colon. There were a few scattered shallow 1-2 millimeter sized aphthous ulcers with otherwise normal mucosa  Descending colon. A 3 mm sized sessile polyp was removed with the cold biopsy forceps  Splenic flexure. Normal  Transverse colon. A 3 mm sized sessile polyp was removed from the mid transverse colon with the cold biopsy forceps  Hepatic flexure. Normal  Ascending colon. A 3 mm sized sessile polyp was removed from the proximal ascending colon with the cold biopsy forceps  Cecum and ileocecal valve. Normal  Assessment: A diminutive polyp was removed from the descending colon, a diminutive polyp was removed from the transverse colon, and a diminutive polyp was removed from the ascending colon.  Recommendation: Schedule surveillance colonoscopy in 5 years

## 2014-10-14 NOTE — Anesthesia Preprocedure Evaluation (Signed)
Anesthesia Evaluation  Patient identified by MRN, date of birth, ID band  Reviewed: Allergy & Precautions, NPO status , Patient's Chart, lab work & pertinent test results  History of Anesthesia Complications Negative for: history of anesthetic complications  Airway Mallampati: II  TM Distance: >3 FB Neck ROM: Full    Dental  (+) Teeth Intact, Dental Advisory Given   Pulmonary neg pulmonary ROS,    Pulmonary exam normal       Cardiovascular hypertension, Pt. on medications     Neuro/Psych negative neurological ROS  negative psych ROS   GI/Hepatic Neg liver ROS, GERD-  ,  Endo/Other  negative endocrine ROS  Renal/GU negative Renal ROS     Musculoskeletal   Abdominal   Peds  Hematology   Anesthesia Other Findings   Reproductive/Obstetrics                             Anesthesia Physical Anesthesia Plan  ASA: II  Anesthesia Plan: MAC   Post-op Pain Management:    Induction: Intravenous  Airway Management Planned: Simple Face Mask  Additional Equipment:   Intra-op Plan:   Post-operative Plan:   Informed Consent: I have reviewed the patients History and Physical, chart, labs and discussed the procedure including the risks, benefits and alternatives for the proposed anesthesia with the patient or authorized representative who has indicated his/her understanding and acceptance.   Dental advisory given  Plan Discussed with: CRNA, Anesthesiologist and Surgeon  Anesthesia Plan Comments:         Anesthesia Quick Evaluation

## 2014-10-14 NOTE — H&P (Signed)
  Procedure: Surveillance colonoscopy. 09/23/2008 colonoscopy with removal of a 4 mm adenomatous descending colon polyp  History: The patient is a 69 year old male born 06-04-1946. He is scheduled to undergo a surveillance colonoscopy today.  Past medical history: Hypertension. Hypercholesterolemia. Gout. PACs. Kidney stones. Elevated PSA. Tonsillectomy. Lithotripsy for kidney stones. Disc surgery. Cholecystectomy.  Medication allergies: Atenolol causes cold extremities  Exam: The patient is alert and lying comfortably on the endoscopy stretcher. Abdomen is soft and nontender to palpation. Lungs are clear to auscultation. Cardiac exam reveals a regular rhythm.  Plan: Proceed with surveillance colonoscopy

## 2014-10-14 NOTE — Anesthesia Postprocedure Evaluation (Signed)
Anesthesia Post Note  Patient: Randall Lopez  Procedure(s) Performed: Procedure(s) (LRB): COLONOSCOPY WITH PROPOFOL (N/A)  Anesthesia type: MAC  Patient location: PACU  Post pain: Pain level controlled  Post assessment: Patient's Cardiovascular Status Stable  Last Vitals:  Filed Vitals:   10/14/14 1430  BP: 145/71  Pulse: 71  Temp:   Resp: 20    Post vital signs: Reviewed and stable  Level of consciousness: sedated  Complications: No apparent anesthesia complications

## 2014-10-15 ENCOUNTER — Encounter (HOSPITAL_COMMUNITY): Payer: Self-pay | Admitting: Gastroenterology

## 2015-11-18 ENCOUNTER — Emergency Department (HOSPITAL_COMMUNITY): Payer: Medicare Other

## 2015-11-18 ENCOUNTER — Emergency Department (HOSPITAL_COMMUNITY)
Admission: EM | Admit: 2015-11-18 | Discharge: 2015-11-18 | Disposition: A | Discharge status: Home / Self Care | Payer: Medicare Other | Attending: Emergency Medicine | Admitting: Emergency Medicine

## 2015-11-18 ENCOUNTER — Encounter (HOSPITAL_COMMUNITY): Payer: Self-pay | Admitting: *Deleted

## 2015-11-18 DIAGNOSIS — Z7952 Long term (current) use of systemic steroids: Secondary | ICD-10-CM | POA: Insufficient documentation

## 2015-11-18 DIAGNOSIS — S8992XA Unspecified injury of left lower leg, initial encounter: Secondary | ICD-10-CM | POA: Diagnosis not present

## 2015-11-18 DIAGNOSIS — Z79899 Other long term (current) drug therapy: Secondary | ICD-10-CM | POA: Diagnosis not present

## 2015-11-18 DIAGNOSIS — Y998 Other external cause status: Secondary | ICD-10-CM | POA: Insufficient documentation

## 2015-11-18 DIAGNOSIS — Z8619 Personal history of other infectious and parasitic diseases: Secondary | ICD-10-CM | POA: Diagnosis not present

## 2015-11-18 DIAGNOSIS — K219 Gastro-esophageal reflux disease without esophagitis: Secondary | ICD-10-CM | POA: Insufficient documentation

## 2015-11-18 DIAGNOSIS — Z8639 Personal history of other endocrine, nutritional and metabolic disease: Secondary | ICD-10-CM | POA: Diagnosis not present

## 2015-11-18 DIAGNOSIS — Z87442 Personal history of urinary calculi: Secondary | ICD-10-CM | POA: Diagnosis not present

## 2015-11-18 DIAGNOSIS — Y9389 Activity, other specified: Secondary | ICD-10-CM | POA: Diagnosis not present

## 2015-11-18 DIAGNOSIS — I10 Essential (primary) hypertension: Secondary | ICD-10-CM | POA: Diagnosis not present

## 2015-11-18 DIAGNOSIS — Y9289 Other specified places as the place of occurrence of the external cause: Secondary | ICD-10-CM | POA: Diagnosis not present

## 2015-11-18 DIAGNOSIS — M109 Gout, unspecified: Secondary | ICD-10-CM | POA: Insufficient documentation

## 2015-11-18 DIAGNOSIS — Z792 Long term (current) use of antibiotics: Secondary | ICD-10-CM | POA: Insufficient documentation

## 2015-11-18 DIAGNOSIS — M25562 Pain in left knee: Secondary | ICD-10-CM

## 2015-11-18 DIAGNOSIS — W230XXA Caught, crushed, jammed, or pinched between moving objects, initial encounter: Secondary | ICD-10-CM | POA: Insufficient documentation

## 2015-11-18 DIAGNOSIS — Z87891 Personal history of nicotine dependence: Secondary | ICD-10-CM | POA: Insufficient documentation

## 2015-11-18 MED ORDER — IBUPROFEN 600 MG PO TABS: 600.0000 mg | ORAL_TABLET | Freq: Four times a day (QID) | ORAL | Status: DC | PRN

## 2015-11-18 MED ORDER — HYDROCODONE-ACETAMINOPHEN 5-325 MG PO TABS
1.0000 | ORAL_TABLET | Freq: Once | ORAL | Status: AC
  Administered 2015-11-18: 1 via ORAL
  Filled 2015-11-18: qty 1

## 2015-11-18 MED ORDER — HYDROCODONE-ACETAMINOPHEN 5-325 MG PO TABS: 1.0000 | ORAL_TABLET | ORAL | Status: DC | PRN

## 2015-11-18 MED ORDER — IBUPROFEN 200 MG PO TABS
600.0000 mg | ORAL_TABLET | Freq: Once | ORAL | Status: AC
  Administered 2015-11-18: 600 mg via ORAL
  Filled 2015-11-18: qty 3

## 2015-11-18 NOTE — ED Provider Notes (Signed)
CSN: [id number]     Arrival date & time 11/18/15  1938 History  By signing my name below, I, Emmanuella Mensah, attest that this documentation has been prepared under the direction and in the presence of Delos Haring, PA-C. Electronically Signed: Judithann Sauger, ED Scribe. 11/18/2015. 9:09 PM.    Chief Complaint  Patient presents with  . Knee Pain   The history is provided by the patient. No language interpreter was used.   HPI Comments: Randall Lopez is a 70 y.o. male with a hx of back surgery and HTN who presents to the Emergency Department complaining of gradually worsening moderate left knee pain s/p fall that occurred approx. 3 hours ago. He explains that he fell off a trailer and landed on his foot. No head/neck injuries or LOC. He reports associated left knee swelling and pain with ambulation where his knee "gives out". No alleviating factors noted. Pt has not tried any medications PTA. No fever, chills, n/v, or any open wounds.   Past Medical History  Diagnosis Date  . Hypertension   . GERD (gastroesophageal reflux disease)   . Gout   . History of Helicobacter pylori infection ~2004 05/02/2012  . Hyperlipidemia   . Tachycardia   . History of kidney stones     x3 -pass 1, litho x2   Past Surgical History  Procedure Laterality Date  . Tonsillectomy    . Lithotripsy    . Lumbar laminectomy/decompression microdiscectomy  12/17/2011    Procedure: LUMBAR LAMINECTOMY/DECOMPRESSION MICRODISCECTOMY;  Surgeon: Melina Schools, MD;  Location: [facility];  Service: Orthopedics;  Laterality: Right;  L4-L5 Microdiscectomy  . Laparoscopic cholecystectomy single port  08/04/2012    Procedure: LAPAROSCOPIC CHOLECYSTECTOMY SINGLE PORT;  Surgeon: Adin Hector, MD;  Location: [facility];  Service: General;  Laterality: N/A;  LYSIS OF ADHESIONS with Intraoperative Cholangiogram   . Cholecystectomy    . Back surgery    . Colonoscopy w/ polypectomy      past hx. polyps-benign  . Cataract extraction,  bilateral      last 09-14-14  . Colonoscopy with propofol N/A 10/14/2014    Procedure: COLONOSCOPY WITH PROPOFOL;  Surgeon: Garlan Fair, MD;  Location: [facility];  Service: Endoscopy;  Laterality: N/A;   Family History  Problem Relation Age of Onset  . Diabetes Mother   . Stroke Father   . Stroke Maternal Grandmother   . Cancer Maternal Grandfather     kidney ca   Social History  Substance Use Topics  . Smoking status: Former Research scientist (life sciences)  . Smokeless tobacco: Never Used  . Alcohol Use: Yes     Comment: occ    Review of Systems  Constitutional: Negative for fever and chills.  Gastrointestinal: Negative for nausea and vomiting.  Musculoskeletal: Positive for joint swelling and arthralgias (left knee).  Skin: Negative for wound.      Allergies  Atenolol  Home Medications   Prior to Admission medications   Medication Sig Start Date End Date Taking? Authorizing Provider  atenolol (TENORMIN) 25 MG tablet Take 25 mg by mouth every morning. 1/2 tab daily 04/05/12   Historical Provider, MD  doxycycline (VIBRA-TABS) 100 MG tablet Take 100 mg by mouth daily.    Historical Provider, MD  HYDROcodone-acetaminophen (NORCO/VICODIN) 5-325 MG tablet Take 1-2 tablets by mouth every 4 (four) hours as needed. 11/18/15   Ebonee Stober Carlota Raspberry, PA-C  ibuprofen (ADVIL,MOTRIN) 200 MG tablet Take 200-400 mg by mouth every 6 (six) hours as needed for mild pain.  Historical Provider, MD  ibuprofen (ADVIL,MOTRIN) 600 MG tablet Take 1 tablet (600 mg total) by mouth every 6 (six) hours as needed. 11/18/15   Mcihael Hinderman Carlota Raspberry, PA-C  omeprazole (PRILOSEC) 20 MG capsule Take 20 mg by mouth every morning.     Historical Provider, MD  potassium chloride SA (KLOR-CON M15) 15 MEQ tablet Take 15 mEq by mouth daily.    Historical Provider, MD  prednisoLONE acetate (PRED FORTE) 1 % ophthalmic suspension Place 1 drop into the left eye 2 (two) times daily.    Historical Provider, MD  sulfamethoxazole-trimethoprim (BACTRIM  DS) 800-160 MG per tablet Take 1 tablet by mouth daily. To prevent hematoma infection. Patient not taking: Reported on 10/11/2014 11/06/12   Alexis Frock, MD  tamsulosin (FLOMAX) 0.4 MG CAPS Take 1 capsule (0.4 mg total) by mouth daily. To promote kidney stone fragement passage. Patient not taking: Reported on 10/11/2014 11/06/12   Alexis Frock, MD   BP 129/82 mmHg  Pulse 79  Temp(Src) 97.7 F (36.5 C) (Oral)  Resp 18  SpO2 100% Physical Exam  Constitutional: He is oriented to person, place, and time. He appears well-developed and well-nourished. No distress.  HENT:  Head: Normocephalic and atraumatic.  Eyes: Conjunctivae and EOM are normal.  Neck: Neck supple.  Cardiovascular: Normal rate.   Pulmonary/Chest: Effort normal. No respiratory distress.  Musculoskeletal:       Left knee: He exhibits decreased range of motion and swelling. He exhibits no effusion, no ecchymosis, no deformity, no laceration, no erythema and normal alignment. Tenderness found. Medial joint line and lateral joint line tenderness noted.  Neurological: He is alert and oriented to person, place, and time.  Skin: Skin is warm and dry.  Psychiatric: He has a normal mood and affect. His behavior is normal.  Nursing note and vitals reviewed.   ED Course  Procedures (including critical care time) DIAGNOSTIC STUDIES: Oxygen Saturation is 100% on RA, normal by my interpretation.    COORDINATION OF CARE: 8:56 PM- Pt advised of plan for treatment and pt agrees. Pt informed of x-ray results. Pt declines crutches, stating that he is unable to use one due to his chronic back pain but will use his walker at home. He will receive prescription for Motrin, Vicodin, and a knee immobilizer. Will provide resources for Ortho follow up. Recommended to use ice and rest.   9:07 PM - Pt discussed with Julianne Rice, MD who agrees with plan.    Imaging Review Dg Knee Complete 4 Views Left  11/18/2015  CLINICAL DATA:  Left knee  pain after injury. Stepped off farm trailer landing on left leg with onset of pain. EXAM: LEFT KNEE - COMPLETE 4+ VIEW COMPARISON:  None. FINDINGS: There is no evidence of fracture, dislocation, or joint effusion. There is no evidence of arthropathy or other focal bone abnormality. Soft tissues are unremarkable. IMPRESSION: No fracture or dislocation of the left knee. Electronically Signed   By: Jeb Levering M.D.   On: 11/18/2015 20:44     Jakaleb Payer Carlota Raspberry, PA-C has personally reviewed and evaluated these images as part of her medical decision-making.  MDM   Final diagnoses:  Left knee pain    Patient X-Ray negative for obvious fracture or dislocation.  Pt advised to follow up with orthopedics. Patient given knee immobilizer while in ED, conservative therapy recommended and discussed. Patient will be discharged home & is agreeable with above plan. Returns precautions discussed. Pt appears safe for discharge.   I personally performed the services  described in this documentation, which was scribed in my presence. The recorded information has been reviewed and is accurate.   Delos Haring, PA-C 11/18/15 2112  Julianne Rice, MD 11/18/15 2322

## 2015-11-18 NOTE — ED Notes (Signed)
Pt states that he fell off a trailer and jammed his left knee; pt states that he attempted to walk and his knee gave way; pt c/o increased pain to knee since fall

## 2015-11-18 NOTE — Discharge Instructions (Signed)
Knee Pain Knee pain is a very common symptom and can have many causes. Knee pain often goes away when you follow your health care provider's instructions for relieving pain and discomfort at home. However, knee pain can develop into a condition that needs treatment. Some conditions may include:  Arthritis caused by wear and tear (osteoarthritis).  Arthritis caused by swelling and irritation (rheumatoid arthritis or gout).  A cyst or growth in your knee.  An infection in your knee joint.  An injury that will not heal.  Damage, swelling, or irritation of the tissues that support your knee (torn ligaments or tendinitis). If your knee pain continues, additional tests may be ordered to diagnose your condition. Tests may include X-rays or other imaging studies of your knee. You may also need to have fluid removed from your knee. Treatment for ongoing knee pain depends on the cause, but treatment may include:  Medicines to relieve pain or swelling.  Steroid injections in your knee.  Physical therapy.  Surgery. HOME CARE INSTRUCTIONS  Take medicines only as directed by your health care provider.  Rest your knee and keep it raised (elevated) while you are resting.  Do not do things that cause or worsen pain.  Avoid high-impact activities or exercises, such as running, jumping rope, or doing jumping jacks.  Apply ice to the knee area:  Put ice in a plastic bag.  Place a towel between your skin and the bag.  Leave the ice on for 20 minutes, 2-3 times a day.  Ask your health care provider if you should wear an elastic knee support.  Keep a pillow under your knee when you sleep.  Lose weight if you are overweight. Extra weight can put pressure on your knee.  Do not use any tobacco products, including cigarettes, chewing tobacco, or electronic cigarettes. If you need help quitting, ask your health care provider. Smoking may slow the healing of any bone and joint problems that you may  have. SEEK MEDICAL CARE IF:  Your knee pain continues, changes, or gets worse.  You have a fever along with knee pain.  Your knee buckles or locks up.  Your knee becomes more swollen. SEEK IMMEDIATE MEDICAL CARE IF:   Your knee joint feels hot to the touch.  You have chest pain or trouble breathing.   This information is not intended to replace advice given to you by your health care provider. Make sure you discuss any questions you have with your health care provider.   Document Released: 05/16/2007 Document Revised: 08/09/2014 Document Reviewed: 03/04/2014 Elsevier Interactive Patient Education 2016 [place] A knee immobilizer is used to support and protect an injured or painful knee. Knee immobilizers keep your knee from being used while it is healing. Some of the common immobilizers used include splints (air, plaster, fiberglass, stiff cloth, or aluminum) or casts. Wear your knee immobilizer as instructed and only remove it as instructed. HOME CARE INSTRUCTIONS   Use absorbent powder (such as baby powder or talcum powder) to control irritation from sweat and friction.  Adjust the immobilizer to be firm but not tight. Signs of an immobilizer that is too tight include:  Swelling.  Numbness.  Color change in your foot or ankle.  Increased pain.  While resting, raise your leg above the level of your heart. Pillows can be used for support. This reduces throbbing and helps healing.  Remove the immobilizer to bathe and sleep. SEEK MEDICAL CARE IF:   You have  increasing pain or swelling in the knee, foot, or ankle.  You have problems caused by the knee immobilizer, or it breaks or needs replacement. MAKE SURE YOU:   Understand these instructions.  Will watch your condition.  Will get help right away if you are not doing well or get worse.   This information is not intended to replace advice given to you by your health care provider. Make sure you  discuss any questions you have with your health care provider.   Document Released: 07/19/2005 Document Revised: 08/09/2014 Document Reviewed: 03/12/2013 Elsevier Interactive Patient Education [employer].

## 2015-12-01 ENCOUNTER — Other Ambulatory Visit: Payer: Self-pay | Admitting: Orthopedic Surgery

## 2015-12-15 ENCOUNTER — Encounter (HOSPITAL_COMMUNITY): Payer: Self-pay

## 2015-12-15 NOTE — Pre-Procedure Instructions (Signed)
    TENNIS FAHEY  12/15/2015      [id number] [place], [place] - [address] [address] Phone: [phone] Fax: [phone]    Your procedure is scheduled on Tues, May 23 @ 10:45 AM  Report to [facility] Admitting at 8:45 AM  Call this number if you have problems the morning of surgery:  [phone]   Remember:  Do not eat food or drink liquids after midnight.  Take these medicines the morning of surgery with A SIP OF WATER Atenolol(Tenormin),Zyrtec(Cetirizine),Doxycycline(Vibra-Tabs),Flonase(Fluticasone),Omeprazole(Prilosec),and Eye Drops              Stop taking your Ibuprofen. No Goody's,BC's,Aleve,Advil,Motrin,Fish Oil,or any Herbal Medications.    Do not wear jewelry.  Do not wear lotions, powders, or colognes.               Men may shave face and neck.  Do not bring valuables to the hospital.  [facility] is not responsible for any belongings or valuables.  Contacts, dentures or bridgework may not be worn into surgery.  Leave your suitcase in the car.  After surgery it may be brought to your room.  For patients admitted to the hospital, discharge time will be determined by your treatment team.  Patients discharged the day of surgery will not be allowed to drive home.    Special instructions:    Please read over the following fact sheets that you were given. Pain Booklet, Coughing and Deep Breathing and Surgical Site Infection Prevention

## 2015-12-16 ENCOUNTER — Encounter (HOSPITAL_COMMUNITY)
Admission: RE | Admit: 2015-12-16 | Discharge: 2015-12-16 | Disposition: A | Discharge status: Home / Self Care | Payer: Medicare Other | Source: Ambulatory Visit | Attending: Orthopedic Surgery | Admitting: Orthopedic Surgery

## 2015-12-16 ENCOUNTER — Encounter (HOSPITAL_COMMUNITY): Payer: Self-pay

## 2015-12-16 ENCOUNTER — Encounter (HOSPITAL_COMMUNITY)
Admission: RE | Admit: 2015-12-16 | Discharge: 2015-12-16 | Disposition: A | Discharge status: Home / Self Care | Payer: Medicare Other | Source: Ambulatory Visit | Attending: Anesthesiology | Admitting: Anesthesiology

## 2015-12-16 DIAGNOSIS — S83512A Sprain of anterior cruciate ligament of left knee, initial encounter: Secondary | ICD-10-CM | POA: Insufficient documentation

## 2015-12-16 DIAGNOSIS — X58XXXA Exposure to other specified factors, initial encounter: Secondary | ICD-10-CM | POA: Diagnosis not present

## 2015-12-16 DIAGNOSIS — Z01818 Encounter for other preprocedural examination: Secondary | ICD-10-CM | POA: Insufficient documentation

## 2015-12-16 DIAGNOSIS — S83282A Other tear of lateral meniscus, current injury, left knee, initial encounter: Secondary | ICD-10-CM | POA: Diagnosis not present

## 2015-12-16 DIAGNOSIS — Z01812 Encounter for preprocedural laboratory examination: Secondary | ICD-10-CM | POA: Insufficient documentation

## 2015-12-16 DIAGNOSIS — J329 Chronic sinusitis, unspecified: Secondary | ICD-10-CM | POA: Diagnosis not present

## 2015-12-16 HISTORY — DX: Chronic sinusitis, unspecified: J32.9

## 2015-12-16 HISTORY — DX: Benign prostatic hyperplasia without lower urinary tract symptoms: N40.0

## 2015-12-16 HISTORY — DX: Cough: R05

## 2015-12-16 HISTORY — DX: Cough, unspecified: R05.9

## 2015-12-16 LAB — BASIC METABOLIC PANEL
ANION GAP: 6 (ref 5–15)
BUN: 16 mg/dL (ref 6–20)
CALCIUM: 9.4 mg/dL (ref 8.9–10.3)
CO2: 26 mmol/L (ref 22–32)
Chloride: 107 mmol/L (ref 101–111)
Creatinine, Ser: 1.09 mg/dL (ref 0.61–1.24)
GFR calc Af Amer: >60 mL/min (ref >60)
GFR calc non Af Amer: >60 mL/min (ref >60)
GLUCOSE: 82 mg/dL (ref 65–99)
Potassium: 3.8 mmol/L (ref 3.5–5.1)
Sodium: 139 mmol/L (ref 135–145)

## 2015-12-16 LAB — CBC
HCT: 46.3 % (ref 39.0–52.0)
HEMOGLOBIN: 16.3 g/dL (ref 13.0–17.0)
MCH: 30.1 pg (ref 26.0–34.0)
MCHC: 35.2 g/dL (ref 30.0–36.0)
MCV: 85.4 fL (ref 78.0–100.0)
Platelets: 279 10*3/uL (ref 150–400)
RBC: 5.42 MIL/uL (ref 4.22–5.81)
RDW: 12.6 % (ref 11.5–15.5)
WBC: 10.2 10*3/uL (ref 4.0–10.5)

## 2015-12-16 MED ORDER — CHLORHEXIDINE GLUCONATE 4 % EX LIQD: 60.0000 mL | Freq: Once | CUTANEOUS | Status: DC

## 2015-12-16 NOTE — Progress Notes (Signed)
Saw Randall Lopez about 3 yrs ago,d/t irregular heartbeat.Was told K was low and was on a diuretic. Was told not needed to be seen again  Medical Md is Dr.Kimberlee Brigitte Pulse  Echo denies  Stress test denies  Heart cath denies  EKG denies in past yr  CXR denies in past yr

## 2015-12-22 MED ORDER — CEFAZOLIN SODIUM-DEXTROSE 2-4 GM/100ML-% IV SOLN
2.0000 g | INTRAVENOUS | Status: AC
  Administered 2015-12-23: 2 g via INTRAVENOUS
  Filled 2015-12-22: qty 100

## 2015-12-23 ENCOUNTER — Encounter (HOSPITAL_COMMUNITY): Payer: Self-pay | Admitting: Critical Care Medicine

## 2015-12-23 ENCOUNTER — Encounter (HOSPITAL_COMMUNITY)
Admission: RE | Disposition: A | Discharge status: Home / Self Care | Payer: Self-pay | Source: Ambulatory Visit | Attending: Orthopedic Surgery

## 2015-12-23 ENCOUNTER — Ambulatory Visit (HOSPITAL_COMMUNITY): Payer: Medicare Other | Admitting: Critical Care Medicine

## 2015-12-23 ENCOUNTER — Ambulatory Visit (HOSPITAL_COMMUNITY)
Admission: RE | Admit: 2015-12-23 | Discharge: 2015-12-23 | Disposition: A | Discharge status: Home / Self Care | Payer: Medicare Other | Source: Ambulatory Visit | Attending: Orthopedic Surgery | Admitting: Orthopedic Surgery

## 2015-12-23 DIAGNOSIS — M109 Gout, unspecified: Secondary | ICD-10-CM | POA: Diagnosis not present

## 2015-12-23 DIAGNOSIS — S83242A Other tear of medial meniscus, current injury, left knee, initial encounter: Secondary | ICD-10-CM | POA: Diagnosis not present

## 2015-12-23 DIAGNOSIS — Y939 Activity, unspecified: Secondary | ICD-10-CM | POA: Insufficient documentation

## 2015-12-23 DIAGNOSIS — Z8051 Family history of malignant neoplasm of kidney: Secondary | ICD-10-CM | POA: Diagnosis not present

## 2015-12-23 DIAGNOSIS — Z9842 Cataract extraction status, left eye: Secondary | ICD-10-CM | POA: Insufficient documentation

## 2015-12-23 DIAGNOSIS — N400 Enlarged prostate without lower urinary tract symptoms: Secondary | ICD-10-CM | POA: Diagnosis not present

## 2015-12-23 DIAGNOSIS — Z833 Family history of diabetes mellitus: Secondary | ICD-10-CM | POA: Insufficient documentation

## 2015-12-23 DIAGNOSIS — I10 Essential (primary) hypertension: Secondary | ICD-10-CM | POA: Diagnosis not present

## 2015-12-23 DIAGNOSIS — Z823 Family history of stroke: Secondary | ICD-10-CM | POA: Diagnosis not present

## 2015-12-23 DIAGNOSIS — Z79899 Other long term (current) drug therapy: Secondary | ICD-10-CM | POA: Insufficient documentation

## 2015-12-23 DIAGNOSIS — X58XXXA Exposure to other specified factors, initial encounter: Secondary | ICD-10-CM | POA: Diagnosis not present

## 2015-12-23 DIAGNOSIS — S83512A Sprain of anterior cruciate ligament of left knee, initial encounter: Secondary | ICD-10-CM | POA: Diagnosis present

## 2015-12-23 DIAGNOSIS — Z9841 Cataract extraction status, right eye: Secondary | ICD-10-CM | POA: Insufficient documentation

## 2015-12-23 DIAGNOSIS — Z9049 Acquired absence of other specified parts of digestive tract: Secondary | ICD-10-CM | POA: Insufficient documentation

## 2015-12-23 DIAGNOSIS — Z87442 Personal history of urinary calculi: Secondary | ICD-10-CM | POA: Diagnosis not present

## 2015-12-23 DIAGNOSIS — S83282A Other tear of lateral meniscus, current injury, left knee, initial encounter: Secondary | ICD-10-CM | POA: Insufficient documentation

## 2015-12-23 DIAGNOSIS — K219 Gastro-esophageal reflux disease without esophagitis: Secondary | ICD-10-CM | POA: Diagnosis not present

## 2015-12-23 HISTORY — PX: ANTERIOR CRUCIATE LIGAMENT REPAIR: SHX115

## 2015-12-23 SURGERY — RECONSTRUCTION, KNEE, ACL, USING HAMSTRING GRAFT
Anesthesia: Spinal | Site: Knee | Laterality: Left

## 2015-12-23 MED ORDER — ASPIRIN EC 325 MG PO TBEC: 325.0000 mg | DELAYED_RELEASE_TABLET | Freq: Every day | ORAL | Status: DC

## 2015-12-23 MED ORDER — KETOROLAC TROMETHAMINE 30 MG/ML IJ SOLN
30.0000 mg | Freq: Once | INTRAMUSCULAR | Status: AC
  Administered 2015-12-23: 30 mg via INTRAVENOUS

## 2015-12-23 MED ORDER — ROCURONIUM BROMIDE 50 MG/5ML IV SOLN
INTRAVENOUS | Status: AC
  Filled 2015-12-23: qty 1

## 2015-12-23 MED ORDER — BUPIVACAINE HCL (PF) 0.25 % IJ SOLN
INTRAMUSCULAR | Status: DC | PRN
  Administered 2015-12-23: 10 mL

## 2015-12-23 MED ORDER — GLYCOPYRROLATE 0.2 MG/ML IV SOSY
PREFILLED_SYRINGE | INTRAVENOUS | Status: AC
  Filled 2015-12-23: qty 3

## 2015-12-23 MED ORDER — OXYCODONE HCL 5 MG PO TABS
5.0000 mg | ORAL_TABLET | ORAL | Status: AC | PRN
  Administered 2015-12-23: 5 mg via ORAL

## 2015-12-23 MED ORDER — MIDAZOLAM HCL 2 MG/2ML IJ SOLN
INTRAMUSCULAR | Status: AC
  Filled 2015-12-23: qty 2

## 2015-12-23 MED ORDER — 0.9 % SODIUM CHLORIDE (POUR BTL) OPTIME
TOPICAL | Status: DC | PRN
  Administered 2015-12-23: 1000 mL

## 2015-12-23 MED ORDER — BUPIVACAINE-EPINEPHRINE (PF) 0.25% -1:200000 IJ SOLN
INTRAMUSCULAR | Status: AC
  Filled 2015-12-23: qty 30

## 2015-12-23 MED ORDER — FENTANYL CITRATE (PF) 100 MCG/2ML IJ SOLN
INTRAMUSCULAR | Status: DC | PRN
  Administered 2015-12-23 (×2): 50 ug via INTRAVENOUS
  Administered 2015-12-23 (×2): 25 ug via INTRAVENOUS

## 2015-12-23 MED ORDER — BUPIVACAINE HCL (PF) 0.25 % IJ SOLN
INTRAMUSCULAR | Status: AC
  Filled 2015-12-23: qty 30

## 2015-12-23 MED ORDER — SODIUM CHLORIDE 0.9 % IR SOLN
Status: DC | PRN
  Administered 2015-12-23 (×2): 3000 mL

## 2015-12-23 MED ORDER — PROPOFOL 10 MG/ML IV BOLUS
INTRAVENOUS | Status: AC
  Filled 2015-12-23: qty 20

## 2015-12-23 MED ORDER — FENTANYL CITRATE (PF) 100 MCG/2ML IJ SOLN
25.0000 ug | INTRAMUSCULAR | Status: DC | PRN
  Administered 2015-12-23 (×3): 50 ug via INTRAVENOUS

## 2015-12-23 MED ORDER — PROPOFOL 500 MG/50ML IV EMUL
INTRAVENOUS | Status: DC | PRN
  Administered 2015-12-23: 25 ug/kg/min via INTRAVENOUS

## 2015-12-23 MED ORDER — LIDOCAINE 2% (20 MG/ML) 5 ML SYRINGE
INTRAMUSCULAR | Status: AC
Start: 2015-12-23 — End: 2015-12-23
  Filled 2015-12-23: qty 10

## 2015-12-23 MED ORDER — CLONIDINE HCL (ANALGESIA) 100 MCG/ML EP SOLN
150.0000 ug | Freq: Once | EPIDURAL | Status: AC
  Administered 2015-12-23: 100 ug via INTRA_ARTICULAR
  Filled 2015-12-23: qty 1.5

## 2015-12-23 MED ORDER — ONDANSETRON HCL 4 MG/2ML IJ SOLN
INTRAMUSCULAR | Status: AC
  Filled 2015-12-23: qty 2

## 2015-12-23 MED ORDER — OXYCODONE HCL 5 MG PO TABS
ORAL_TABLET | ORAL | Status: AC
  Filled 2015-12-23: qty 1

## 2015-12-23 MED ORDER — LACTATED RINGERS IV SOLN
INTRAVENOUS | Status: DC
  Administered 2015-12-23: 10:00:00 via INTRAVENOUS

## 2015-12-23 MED ORDER — DEXAMETHASONE SODIUM PHOSPHATE 10 MG/ML IJ SOLN
INTRAMUSCULAR | Status: AC
  Filled 2015-12-23: qty 1

## 2015-12-23 MED ORDER — STERILE WATER FOR IRRIGATION IR SOLN
Status: DC | PRN
  Administered 2015-12-23: 1000 mL

## 2015-12-23 MED ORDER — MORPHINE SULFATE (PF) 4 MG/ML IV SOLN
INTRAVENOUS | Status: DC | PRN
  Administered 2015-12-23 (×2): 4 mg

## 2015-12-23 MED ORDER — PROPOFOL 10 MG/ML IV BOLUS
INTRAVENOUS | Status: DC | PRN
  Administered 2015-12-23: 10 mg via INTRAVENOUS

## 2015-12-23 MED ORDER — EPINEPHRINE HCL 1 MG/ML IJ SOLN
INTRAMUSCULAR | Status: DC | PRN
  Administered 2015-12-23 (×2): .1 mL

## 2015-12-23 MED ORDER — MORPHINE SULFATE (PF) 4 MG/ML IV SOLN
INTRAVENOUS | Status: AC
  Filled 2015-12-23: qty 1

## 2015-12-23 MED ORDER — FENTANYL CITRATE (PF) 100 MCG/2ML IJ SOLN
INTRAMUSCULAR | Status: AC
  Administered 2015-12-23: 50 ug via INTRAVENOUS
  Filled 2015-12-23: qty 2

## 2015-12-23 MED ORDER — METHOCARBAMOL 500 MG PO TABS: 500.0000 mg | ORAL_TABLET | Freq: Four times a day (QID) | ORAL | Status: DC

## 2015-12-23 MED ORDER — EPINEPHRINE HCL 1 MG/ML IJ SOLN
INTRAMUSCULAR | Status: AC
  Filled 2015-12-23: qty 5

## 2015-12-23 MED ORDER — FENTANYL CITRATE (PF) 100 MCG/2ML IJ SOLN
INTRAMUSCULAR | Status: AC
  Filled 2015-12-23: qty 2

## 2015-12-23 MED ORDER — METHOCARBAMOL 500 MG PO TABS
500.0000 mg | ORAL_TABLET | Freq: Three times a day (TID) | ORAL | Status: AC
  Administered 2015-12-23: 500 mg via ORAL

## 2015-12-23 MED ORDER — PHENYLEPHRINE HCL 10 MG/ML IJ SOLN
10.0000 mg | INTRAMUSCULAR | Status: DC | PRN
  Administered 2015-12-23: 50 ug/min via INTRAVENOUS

## 2015-12-23 MED ORDER — NEOSTIGMINE METHYLSULFATE 5 MG/5ML IV SOSY
PREFILLED_SYRINGE | INTRAVENOUS | Status: AC
  Filled 2015-12-23: qty 5

## 2015-12-23 MED ORDER — KETOROLAC TROMETHAMINE 30 MG/ML IJ SOLN
INTRAMUSCULAR | Status: AC
  Filled 2015-12-23: qty 1

## 2015-12-23 MED ORDER — OXYCODONE HCL 5 MG PO TABS
ORAL_TABLET | ORAL | Status: AC
Start: 2015-12-23 — End: 2015-12-23
  Administered 2015-12-23: 5 mg via ORAL
  Filled 2015-12-23: qty 2

## 2015-12-23 MED ORDER — FENTANYL CITRATE (PF) 250 MCG/5ML IJ SOLN
INTRAMUSCULAR | Status: AC
Start: 2015-12-23 — End: 2015-12-23
  Filled 2015-12-23: qty 5

## 2015-12-23 MED ORDER — BUPIVACAINE-EPINEPHRINE 0.25% -1:200000 IJ SOLN
INTRAMUSCULAR | Status: DC | PRN
  Administered 2015-12-23: 30 mL

## 2015-12-23 MED ORDER — OXYCODONE-ACETAMINOPHEN 5-325 MG PO TABS: 1.0000 | ORAL_TABLET | Freq: Four times a day (QID) | ORAL | Status: DC | PRN

## 2015-12-23 MED ORDER — OXYCODONE HCL 5 MG PO TABS
10.0000 mg | ORAL_TABLET | ORAL | Status: DC | PRN
  Administered 2015-12-23: 10 mg via ORAL

## 2015-12-23 MED ORDER — MIDAZOLAM HCL 5 MG/5ML IJ SOLN
INTRAMUSCULAR | Status: DC | PRN
  Administered 2015-12-23: 2 mg via INTRAVENOUS

## 2015-12-23 MED ORDER — METHOCARBAMOL 500 MG PO TABS
ORAL_TABLET | ORAL | Status: AC
  Filled 2015-12-23: qty 1

## 2015-12-23 SURGICAL SUPPLY — 88 items
ANCHOR BUTTON TIGHTROPE ACL RT (Orthopedic Implant) ×3 IMPLANT
BANDAGE ESMARK 6X9 LF (GAUZE/BANDAGES/DRESSINGS) IMPLANT
BLADE CUTTER GATOR 3.5 (BLADE) IMPLANT
BLADE GREAT WHITE 4.2 (BLADE) ×2 IMPLANT
BLADE GREAT WHITE 4.2MM (BLADE) ×1
BLADE SURG 10 STRL SS (BLADE) ×3 IMPLANT
BLADE SURG 15 STRL LF DISP TIS (BLADE) ×2 IMPLANT
BLADE SURG 15 STRL SS (BLADE) ×4
BNDG ELASTIC 6X15 VLCR STRL LF (GAUZE/BANDAGES/DRESSINGS) ×3 IMPLANT
BNDG ESMARK 6X9 LF (GAUZE/BANDAGES/DRESSINGS)
BONE MATRIX DEMINERALIZED 1CC (Bone Implant) ×6 IMPLANT
BUR OVAL 6.0 (BURR) ×3 IMPLANT
CLOSURE STERI-STRIP 1/2X4 (GAUZE/BANDAGES/DRESSINGS) ×1
CLOSURE WOUND 1/2 X4 (GAUZE/BANDAGES/DRESSINGS) ×1
CLSR STERI-STRIP ANTIMIC 1/2X4 (GAUZE/BANDAGES/DRESSINGS) ×2 IMPLANT
COVER MAYO STAND STRL (DRAPES) IMPLANT
COVER SURGICAL LIGHT HANDLE (MISCELLANEOUS) ×3 IMPLANT
CUFF TOURNIQUET SINGLE 34IN LL (TOURNIQUET CUFF) IMPLANT
CUFF TOURNIQUET SINGLE 44IN (TOURNIQUET CUFF) IMPLANT
DECANTER SPIKE VIAL GLASS SM (MISCELLANEOUS) ×3 IMPLANT
DRAPE ARTHROSCOPY W/POUCH 114 (DRAPES) ×3 IMPLANT
DRAPE INCISE IOBAN 66X45 STRL (DRAPES) ×3 IMPLANT
DRAPE U-SHAPE 47X51 STRL (DRAPES) ×3 IMPLANT
DRILL FLIPCUTTER II 10MM (CUTTER) ×1 IMPLANT
DRILL FLIPCUTTER II 7.5MM (MISCELLANEOUS) IMPLANT
DRILL FLIPCUTTER II 8.0MM (INSTRUMENTS) IMPLANT
DRILL FLIPCUTTER II 8.5MM (INSTRUMENTS) IMPLANT
DRILL FLIPCUTTER II 9.0MM (INSTRUMENTS) IMPLANT
DRSG PAD ABDOMINAL 8X10 ST (GAUZE/BANDAGES/DRESSINGS) ×3 IMPLANT
DRSG TEGADERM 4X4.75 (GAUZE/BANDAGES/DRESSINGS) ×3 IMPLANT
DURAPREP 26ML APPLICATOR (WOUND CARE) ×6 IMPLANT
ELECT REM PT RETURN 9FT ADLT (ELECTROSURGICAL) ×3
ELECTRODE REM PT RTRN 9FT ADLT (ELECTROSURGICAL) ×1 IMPLANT
FLIPCUTTER II 10MM (CUTTER) ×3
FLIPCUTTER II 7.5MM (MISCELLANEOUS)
FLIPCUTTER II 8.0MM (INSTRUMENTS)
FLIPCUTTER II 8.5MM (INSTRUMENTS)
FLIPCUTTER II 9.0MM (INSTRUMENTS)
GAUZE SPONGE 4X4 12PLY STRL (GAUZE/BANDAGES/DRESSINGS) ×6 IMPLANT
GAUZE XEROFORM 1X8 LF (GAUZE/BANDAGES/DRESSINGS) ×3 IMPLANT
GLOVE BIOGEL PI IND STRL 8 (GLOVE) ×2 IMPLANT
GLOVE BIOGEL PI INDICATOR 8 (GLOVE) ×4
GLOVE ORTHO TXT STRL SZ7.5 (GLOVE) ×3 IMPLANT
GLOVE SURG ORTHO 8.0 STRL STRW (GLOVE) ×3 IMPLANT
GOWN SPEC L3 XXLG W/TWL (GOWN DISPOSABLE) ×3 IMPLANT
GOWN STRL REUS W/ TWL LRG LVL3 (GOWN DISPOSABLE) ×2 IMPLANT
GOWN STRL REUS W/TWL LRG LVL3 (GOWN DISPOSABLE) ×4
IMMOBILIZER KNEE 22 UNIV (SOFTGOODS) ×3 IMPLANT
KIT BASIN OR (CUSTOM PROCEDURE TRAY) ×3 IMPLANT
KIT BIOCARTILAGE DEL W/SYRINGE (KITS) ×3 IMPLANT
KIT ROOM TURNOVER OR (KITS) ×3 IMPLANT
MANIFOLD NEPTUNE II (INSTRUMENTS) ×3 IMPLANT
NDL SUT 6 .5 CRC .975X.05 MAYO (NEEDLE) ×1 IMPLANT
NEEDLE 18GX1X1/2 (RX/OR ONLY) (NEEDLE) ×3 IMPLANT
NEEDLE MAYO TAPER (NEEDLE) ×2
NS IRRIG 1000ML POUR BTL (IV SOLUTION) ×3 IMPLANT
PACK ARTHROSCOPY DSU (CUSTOM PROCEDURE TRAY) ×3 IMPLANT
PAD ARMBOARD 7.5X6 YLW CONV (MISCELLANEOUS) ×3 IMPLANT
PAD CAST 4YDX4 CTTN HI CHSV (CAST SUPPLIES) ×1 IMPLANT
PADDING CAST COTTON 4X4 STRL (CAST SUPPLIES) ×2
PADDING CAST COTTON 6X4 STRL (CAST SUPPLIES) ×9 IMPLANT
PENCIL BUTTON HOLSTER BLD 10FT (ELECTRODE) ×3 IMPLANT
PK GRAFTLINK AUTO IMPLANT SYST (Anchor) ×3 IMPLANT
SET ARTHROSCOPY TUBING (MISCELLANEOUS) ×2
SET ARTHROSCOPY TUBING LN (MISCELLANEOUS) ×1 IMPLANT
SPONGE GAUZE 4X4 12PLY STER LF (GAUZE/BANDAGES/DRESSINGS) ×3 IMPLANT
SPONGE LAP 4X18 X RAY DECT (DISPOSABLE) ×6 IMPLANT
SPONGE SCRUB IODOPHOR (GAUZE/BANDAGES/DRESSINGS) ×3 IMPLANT
STRIP CLOSURE SKIN 1/2X4 (GAUZE/BANDAGES/DRESSINGS) ×2 IMPLANT
SUCTION FRAZIER HANDLE 10FR (MISCELLANEOUS) ×2
SUCTION FRAZIER TIP 8 FR DISP (SUCTIONS) ×2
SUCTION TUBE FRAZIER 10FR DISP (MISCELLANEOUS) ×1 IMPLANT
SUCTION TUBE FRAZIER 8FR DISP (SUCTIONS) ×1 IMPLANT
SUT ETHILON 3 0 PS 1 (SUTURE) ×6 IMPLANT
SUT MNCRL AB 3-0 PS2 18 (SUTURE) ×3 IMPLANT
SUT VIC AB 0 CT1 27 (SUTURE) ×2
SUT VIC AB 0 CT1 27XBRD ANBCTR (SUTURE) ×1 IMPLANT
SUT VIC AB 2-0 CT1 27 (SUTURE) ×2
SUT VIC AB 2-0 CT1 TAPERPNT 27 (SUTURE) ×1 IMPLANT
SUT VICRYL 0 UR6 27IN ABS (SUTURE) ×3 IMPLANT
SYR 30ML LL (SYRINGE) ×3 IMPLANT
SYR BULB IRRIGATION 50ML (SYRINGE) ×3 IMPLANT
SYR TB 1ML LUER SLIP (SYRINGE) ×3 IMPLANT
SYSTEM GRAFT IMPLANT AUTOGRAFT (Anchor) ×1 IMPLANT
TOWEL OR 17X24 6PK STRL BLUE (TOWEL DISPOSABLE) ×3 IMPLANT
TOWEL OR 17X26 10 PK STRL BLUE (TOWEL DISPOSABLE) ×3 IMPLANT
UNDERPAD 30X30 INCONTINENT (UNDERPADS AND DIAPERS) ×3 IMPLANT
WRAP KNEE MAXI GEL POST OP (GAUZE/BANDAGES/DRESSINGS) ×3 IMPLANT

## 2015-12-23 NOTE — H&P (Signed)
Randall Lopez is an 70 y.o. male.   Chief Complaint: lleft knee pain and instability HPI: Randall Lopez is a 70 year old patient left knee pain and instability had an injury about 2 months ago.  MRI scan confirmed meniscal damage as well as anterior cruciate ligament tear and he has had symptomatic instability since that time .MCL was also injured but on exam it feels like that is become healed and has not much in way of medial instability at this time by report or by exam.  No family history of DVT or pulmonary embolism  Past Medical History  Diagnosis Date  . Gout     hx of,not on any meds  . Tachycardia   . History of kidney stones     x3 -pass 1, litho x2  . Hypertension     takes Atenolol daily  . GERD (gastroesophageal reflux disease)     takes Omeprazole daily  . Sinus infection     completed z pak 12/16/15.  Randall Lopez Cough     low grade fever  . Enlarged prostate     Past Surgical History  Procedure Laterality Date  . Tonsillectomy    . Lithotripsy    . Lumbar laminectomy/decompression microdiscectomy  12/17/2011    Procedure: LUMBAR LAMINECTOMY/DECOMPRESSION MICRODISCECTOMY;  Surgeon: Melina Schools, MD;  Location: [facility];  Service: Orthopedics;  Laterality: Right;  L4-L5 Microdiscectomy  . Laparoscopic cholecystectomy single port  08/04/2012    Procedure: LAPAROSCOPIC CHOLECYSTECTOMY SINGLE PORT;  Surgeon: Adin Hector, MD;  Location: [facility];  Service: General;  Laterality: N/A;  LYSIS OF ADHESIONS with Intraoperative Cholangiogram   . Cholecystectomy    . Back surgery    . Colonoscopy w/ polypectomy      past hx. polyps-benign  . Cataract extraction, bilateral      last 09-14-14  . Colonoscopy with propofol N/A 10/14/2014    Procedure: COLONOSCOPY WITH PROPOFOL;  Surgeon: Garlan Fair, MD;  Location: [facility];  Service: Endoscopy;  Laterality: N/A;    Family History  Problem Relation Age of Onset  . Diabetes Mother   . Stroke Father   . Stroke Maternal Grandmother   .  Cancer Maternal Grandfather     kidney ca   Social History:  reports that he has never smoked. He has never used smokeless tobacco. He reports that he does not drink alcohol or use illicit drugs.  Allergies: No Known Allergies  Medications Prior to Admission  Medication Sig Dispense Refill  . acetaminophen (TYLENOL) 500 MG tablet Take 500-1,000 mg by mouth every 6 (six) hours as needed for mild pain.    Randall Lopez atenolol (TENORMIN) 25 MG tablet Take 25 mg by mouth daily.     . cetirizine (ZYRTEC) 10 MG tablet Take 10 mg by mouth daily.    Randall Lopez doxycycline (VIBRA-TABS) 100 MG tablet Take 100 mg by mouth daily.    . fluticasone (FLONASE) 50 MCG/ACT nasal spray Place 1 spray into both nostrils daily.    Randall Lopez ibuprofen (ADVIL,MOTRIN) 200 MG tablet Take 200-400 mg by mouth every 6 (six) hours as needed for mild pain.    Randall Lopez omeprazole (PRILOSEC) 20 MG capsule Take 20 mg by mouth every morning.     . Potassium Citrate 15 MEQ (1620 MG) TBCR Take 1 tablet by mouth daily.    . potassium chloride SA (KLOR-CON M15) 15 MEQ tablet Take 15 mEq by mouth daily.    . prednisoLONE acetate (PRED FORTE) 1 % ophthalmic suspension Place 1 drop  into the left eye 2 (two) times daily.      No results found for this or any previous visit (from the past 48 hour(s)). No results found.  Review of Systems  Constitutional: Negative.   HENT: Negative.   Eyes: Negative.   Respiratory: Negative.   Cardiovascular: Negative.   Gastrointestinal: Negative.   Genitourinary: Negative.   Musculoskeletal: Positive for joint pain.  Skin: Negative.   Neurological: Negative.   Endo/Heme/Allergies: Negative.   Psychiatric/Behavioral: Negative.     Blood pressure 137/60, pulse 65, temperature 97.9 F (36.6 C), temperature source Oral, resp. rate 20, height 5' 9.5" (1.765 m), weight 94.802 kg (209 lb), SpO2 97 %. Physical Exam  Constitutional: He appears well-developed.  HENT:  Head: Normocephalic.  Eyes: Pupils are equal, round, and  reactive to light.  Neck: Normal range of motion.  Cardiovascular: Normal rate.   Respiratory: Effort normal.  Neurological: He is alert.  Skin: Skin is warm.  Psychiatric: He has a normal mood and affect.   left knee demonstrates palpable pedal pulses good range of motion some medial joint line tenderness his MCL feels about 2 mm looser than the right in extension and at 30 of flexion but he does have an endpoint negative patellar apprehension and no increased instability laterally on the left-hand side anterior cruciate ligament is out PCL is intact lateral rotatory instability is noted ankle dorsi and plantarflexion is intact  Assessment/Plan Impression is anterior cruciate ligament tear medial meniscal damage lateral meniscal damage as well as MCL tear and an active 70 year old patient is having symptomatically instability risks and benefits of surgical intervention discussed his along with alternatives such as bracing.  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  Risks and benefits including limited to infection or vessel damage knee range of motion limitation all discussed with the patient will questions answered  Meredith Pel, MD 12/23/2015, 11:07 AM

## 2015-12-23 NOTE — Progress Notes (Signed)
Dr. Kalman Shan updated. Pt moving legs well, has voided 100cc from bladder. Pain level 8-9/10.  Orders received.

## 2015-12-23 NOTE — Brief Op Note (Signed)
12/23/2015  2:13 PM  PATIENT:  Randall Lopez  70 y.o. male  PRE-OPERATIVE DIAGNOSIS:  LEFT KNEE ANTERIOR CRUCIATE LIGAMENT TEAR, medial and LATERAL MENISCAL TEAR  POST-OPERATIVE DIAGNOSIS:  LEFT KNEE ANTERIOR CRUCIATE LIGAMENT TEAR, medial and LATERAL MENISCAL TEAR  PROCEDURE:  Procedure(s): LEFT KNEE DIAGNOSTIC OPERATIVE ARTHROSCOPY, DEBRIDEMENT, RECONSTRUCTION ANTERIOR CRUCIATE LIGAMENT (ACL) WITH HAMSTRING GRAFT partial medial and lateral menisectomy  SURGEON:  Surgeon(s): Meredith Pel, MD  ASSISTANT: Laure Kidney RNFA  ANESTHESIA:     EBL: 10 ml    Total I/O In: 1000 [I.V.:1000] Out: -   BLOOD ADMINISTERED: none  DRAINS: none   LOCAL MEDICATIONS USED:  none  SPECIMEN:  No Specimen  COUNTS:  YES  TOURNIQUET:    DICTATION: .Other Dictation: Dictation Number [id number]  PLAN OF CARE: Discharge to home after PACU  PATIENT DISPOSITION:  PACU - hemodynamically stable

## 2015-12-23 NOTE — Transfer of Care (Signed)
Immediate Anesthesia Transfer of Care Note  Patient: Randall Lopez  Procedure(s) Performed: Procedure(s) with comments: LEFT KNEE DIAGNOSTIC OPERATIVE ARTHROSCOPY, DEBRIDEMENT, RECONSTRUCTION ANTERIOR CRUCIATE LIGAMENT (ACL) WITH HAMSTRING GRAFT (Left) - LEFT KNEE DIAGNOSTIC OPERATIVE ARHTROSCOPY, DEBRIDEMENT, ANTERIOR CRUCIATE LIGAMENT RECONSTRUCTION IWTH HAMSTRING AUTOGRAFT.  Patient Location: PACU  Anesthesia Type:MAC and Spinal  Level of Consciousness: awake, alert  and oriented  Airway & Oxygen Therapy: Patient Spontanous Breathing  Post-op Assessment: Report given to RN and Post -op Vital signs reviewed and stable  Post vital signs: Reviewed and stable  Last Vitals:  Filed Vitals:   12/23/15 0902 12/23/15 1425  BP: 137/60   Pulse: 65 88  Temp: 36.6 C   Resp: 20 15   BP 122/57, Sats 100% on RA  Last Pain: There were no vitals filed for this visit.    Patients Stated Pain Goal: 6 ([id number])  Complications: No apparent anesthesia complications

## 2015-12-23 NOTE — Anesthesia Procedure Notes (Signed)
Spinal Patient location during procedure: OR Preanesthetic Checklist Completed: patient identified, site marked, surgical consent, pre-op evaluation, timeout performed, IV checked, risks and benefits discussed and monitors and equipment checked Spinal Block Patient position: sitting Prep: DuraPrep Patient monitoring: heart rate, cardiac monitor, continuous pulse ox and blood pressure Approach: midline Location: L3-4 Injection technique: single-shot Needle Needle type: Sprotte  Needle gauge: 24 G Needle length: 9 cm Assessment Sensory level: T8 Additional Notes Spinal Dosage in OR  Bupivicaine ml       2.5cc Slight incr HOB and LLD x 2 min

## 2015-12-23 NOTE — Anesthesia Preprocedure Evaluation (Signed)
Anesthesia Evaluation  Patient identified by MRN, date of birth, ID band Patient awake    Reviewed: Allergy & Precautions, H&P , Patient's Chart, lab work & pertinent test results  Airway Mallampati: II  TM Distance: >3 FB Neck ROM: full    Dental no notable dental hx.    Pulmonary    Pulmonary exam normal breath sounds clear to auscultation       Cardiovascular Exercise Tolerance: Good hypertension,  Rhythm:regular Rate:Normal     Neuro/Psych    GI/Hepatic   Endo/Other    Renal/GU      Musculoskeletal   Abdominal   Peds  Hematology   Anesthesia Other Findings   Reproductive/Obstetrics                             Anesthesia Physical Anesthesia Plan  ASA: II  Anesthesia Plan: Spinal   Post-op Pain Management:    Induction:   Airway Management Planned:   Additional Equipment:   Intra-op Plan:   Post-operative Plan:   Informed Consent: I have reviewed the patients History and Physical, chart, labs and discussed the procedure including the risks, benefits and alternatives for the proposed anesthesia with the patient or authorized representative who has indicated his/her understanding and acceptance.   Dental Advisory Given  Plan Discussed with: CRNA  Anesthesia Plan Comments: (Lab work confirmed with CRNA in room. Platelets okay. Discussed spinal anesthetic, and patient consents to the procedure:  included risk of possible headache,backache, failed block, allergic reaction, and nerve injury. This patient was asked if she had any questions or concerns before the procedure started. )        Anesthesia Quick Evaluation

## 2015-12-23 NOTE — Op Note (Signed)
NAME:  Randall Lopez, Randall Lopez NO.:  [id number]  MEDICAL RECORD NO.:  [id number]  LOCATION:  [facility]                         FACILITY:  [facility]  PHYSICIAN:  Anderson Malta, M.D.    DATE OF BIRTH:  Jun 09, 1946  DATE OF PROCEDURE: DATE OF DISCHARGE:  12/23/2015                              OPERATIVE REPORT   PREOPERATIVE DIAGNOSES:  Left knee anterior cruciate ligament tear, medial and lateral meniscal tears.  POSTOPERATIVE DIAGNOSES:  Left knee anterior cruciate ligament tear, medial and lateral meniscal tears.  PROCEDURE:  Left knee anterior cruciate ligament reconstruction, hamstring autograft 10 mm, partial medial and lateral meniscectomy.  SURGEON:  Anderson Malta, M.D.  ASSISTANT:  Laure Kidney, RNFA.  INDICATIONS:  Randall Lopez is an active 70 year old patient with left knee pain and instability, presents for operative management after explanation of risks and benefits.  OPERATIVE FINDINGS: 1. Examination under anesthesia, range of motion 0-3, had about 5     degrees of hyperextension to full flexion, had symmetric stability     to varus and valgus stress at both 0 and 30 degrees.  No postop     rotatory instability was noted on the left knee.  ACL was out, PCL     intact. 2. Diagnostic arthroscopy:     a.     Intact patellofemoral compartment.     b.     No loose bodies on medial and lateral gutter.     c.     Torn ACL and intact PCL.     d.     Torn medial meniscus involving about 70% anterior-posterior      width of the posterior horn over 1 cm area, unstable flaps with      minimal chondral damage on the medial femoral condyle and medial      tibial plateau.     e.     Tear of the lateral meniscus, maximum thickness 90%, width      of the meniscus over 1 cm area just at the level of the popliteus      and joint line with grade 1-2 changes on the lateral femoral      condyle, some loose chondral flaps.  PROCEDURE IN DETAIL:  The patient was brought to the  operating room where general anesthetic was induced.  Preoperative antibiotics were administered.  Time-out was called.  Spinal anesthetic was induced. Knee was examined under anesthesia, found to have symmetric stability to MCL testing at 0 and 30 degrees indicating healing of that ligament.  At this time, the knee was prescrubbed with alcohol and Betadine, allowed to air dry, prepped with DuraPrep solution and draped in a sterile manner.  Randall Lopez was used to cover the operative field.  Incision was made over the pes bursa tendon.  Skin and subcutaneous tissue were sharply divided.  Semitendinosus was then dissected free and harvested. Prepared on the back table by Laure Kidney, dual EndoButton technique 10 mm.  Currently, anterior-inferior medial port was established under direct visualization.  Arthroscopy was performed.  Patellofemoral compartment intact.  Medial compartment articular cartilage was intact, but there was medial meniscal tear involving about 70% anterior- posterior width of  the posterior horn with unstable flaps, this was taken back to stable rim with combination of basket punch and shaver. ACL was torn.  ACL stump debrided, notchplasty performed.  Over-the-top position identified.  Lateral compartment was inspected, worst changes on the lateral femoral condyle were noted.  The lateral meniscal tear was debrided back to a stable rim with combination of basket punch and shaver involved about 90% anterior-posterior width of the meniscus, but the remaining rim was stable.  Following meniscal debridement and notchplasty, the flip cutter was placed at the 3 o'clock position on the left knee, lateral femoral condyle, hole drill on the tibial side with the guide at 60 degrees.  The tibial tunnel was drilled to a depth of about 30 mm.  Graft was passed and secured on the femur, then tibia in full extension with excellent stability achieved.  At this time, thorough irrigation was  performed of the knee joint harvest site. Harvest site was closed using interrupted inverted 0 Vicryl suture, 2-0 Vicryl suture and a Monocryl.  Portals were closed using 2-0 Vicryl and 3-0 nylon.  Drill site anterolaterally closed using 3-0 Vicryl and 3-0 nylon.  Solution of Marcaine, morphine, clonidine injected into the knee.  Bulky dressing applied.  The patient tolerated the procedure well without immediate complication.  Tourniquet not utilized.     Anderson Malta, M.D.     GSD/MEDQ  D:  12/23/2015  T:  12/23/2015  Job:  [id number]

## 2015-12-23 NOTE — Anesthesia Postprocedure Evaluation (Signed)
Anesthesia Post Note  Patient: Randall Lopez  Procedure(s) Performed: Procedure(s) (LRB): LEFT KNEE DIAGNOSTIC OPERATIVE ARTHROSCOPY, DEBRIDEMENT, RECONSTRUCTION ANTERIOR CRUCIATE LIGAMENT (ACL) WITH HAMSTRING GRAFT (Left)  Patient location during evaluation: PACU Anesthesia Type: Spinal Level of consciousness: awake Pain management: satisfactory to patient Vital Signs Assessment: post-procedure vital signs reviewed and stable Respiratory status: spontaneous breathing Cardiovascular status: blood pressure returned to baseline Postop Assessment: no headache and spinal receding Anesthetic complications: no    Last Vitals:  Filed Vitals:   12/23/15 1425 12/23/15 1442  BP: 122/57 134/71  Pulse: 88 77  Temp: 36.4 C   Resp: 15 12    Last Pain:  Filed Vitals:   12/23/15 1449  PainSc: [place]in

## 2015-12-24 ENCOUNTER — Encounter (HOSPITAL_COMMUNITY): Payer: Self-pay | Admitting: Orthopedic Surgery

## 2016-06-18 ENCOUNTER — Encounter (HOSPITAL_COMMUNITY): Payer: Self-pay

## 2016-06-18 ENCOUNTER — Emergency Department (HOSPITAL_COMMUNITY)
Admission: EM | Admit: 2016-06-18 | Discharge: 2016-06-18 | Disposition: A | Discharge status: Home / Self Care | Payer: Medicare Other | Attending: Physician Assistant | Admitting: Physician Assistant

## 2016-06-18 ENCOUNTER — Emergency Department (HOSPITAL_COMMUNITY): Payer: Medicare Other

## 2016-06-18 DIAGNOSIS — Z79899 Other long term (current) drug therapy: Secondary | ICD-10-CM | POA: Insufficient documentation

## 2016-06-18 DIAGNOSIS — I10 Essential (primary) hypertension: Secondary | ICD-10-CM | POA: Insufficient documentation

## 2016-06-18 DIAGNOSIS — R109 Unspecified abdominal pain: Secondary | ICD-10-CM | POA: Diagnosis present

## 2016-06-18 DIAGNOSIS — Z7982 Long term (current) use of aspirin: Secondary | ICD-10-CM | POA: Diagnosis not present

## 2016-06-18 DIAGNOSIS — N23 Unspecified renal colic: Secondary | ICD-10-CM | POA: Diagnosis not present

## 2016-06-18 LAB — URINE MICROSCOPIC-ADD ON: SQUAMOUS EPITHELIAL / LPF: NONE SEEN

## 2016-06-18 LAB — URINALYSIS, ROUTINE W REFLEX MICROSCOPIC
Bilirubin Urine: NEGATIVE
Glucose, UA: NEGATIVE mg/dL
Ketones, ur: NEGATIVE mg/dL
Leukocytes, UA: NEGATIVE
Nitrite: NEGATIVE
PH: 6 (ref 5.0–8.0)
Protein, ur: NEGATIVE mg/dL
SPECIFIC GRAVITY, URINE: 1.021 (ref 1.005–1.030)

## 2016-06-18 NOTE — ED Provider Notes (Signed)
[facility] DEPT Provider Note   CSN: [id number] Arrival date & time: 06/18/16  1802     History   Chief Complaint Chief Complaint  Patient presents with  . Flank Pain    Left side.    HPI ABDULRAHIM Lopez is a 70 y.o. male.  HPI Patient presents to the emergency department with left flank pain with nausea and vomiting that started this afternoon.  Patient states he has had some urinary hesitancy over the last 24 hours.  Patient states that he has had previous history of kidney  and feels similar, states that this time he has no pain whatsoever.  Patient states that he did not take any medications prior to paralysis up and seems make the condition better or worse. The patient denies chest pain, shortness of breath, headache,blurred vision, neck pain, fever, cough, weakness, numbness, dizziness, anorexia, edema,  diarrhea, rash, back pain, dysuria, hematemesis, bloody stool, near syncope, or syncope. Past Medical History:  Diagnosis Date  . Cough    low grade fever  . Enlarged prostate   . GERD (gastroesophageal reflux disease)    takes Omeprazole daily  . Gout    hx of,not on any meds  . History of kidney stones    x3 -pass 1, litho x2  . Hypertension    takes Atenolol daily  . Sinus infection    completed z pak 12/16/15.  . Tachycardia     Patient Active Problem List   Diagnosis Date Noted  . Chronic cholecystitis with calculus 05/02/2012  . History of Helicobacter pylori infection ~2004 05/02/2012  . GERD (gastroesophageal reflux disease)   . Hypertension     Past Surgical History:  Procedure Laterality Date  . ANTERIOR CRUCIATE LIGAMENT REPAIR Left 12/23/2015   Procedure: LEFT KNEE DIAGNOSTIC OPERATIVE ARTHROSCOPY, DEBRIDEMENT, RECONSTRUCTION ANTERIOR CRUCIATE LIGAMENT (ACL) WITH HAMSTRING GRAFT;  Surgeon: Meredith Pel, MD;  Location: [facility];  Service: Orthopedics;  Laterality: Left;  LEFT KNEE DIAGNOSTIC OPERATIVE ARHTROSCOPY, DEBRIDEMENT, ANTERIOR CRUCIATE  LIGAMENT RECONSTRUCTION IWTH HAMSTRING AUTOGRAFT.  Marland Kitchen BACK SURGERY    . CATARACT EXTRACTION, BILATERAL     last 09-14-14  . CHOLECYSTECTOMY    . COLONOSCOPY W/ POLYPECTOMY     past hx. polyps-benign  . COLONOSCOPY WITH PROPOFOL N/A 10/14/2014   Procedure: COLONOSCOPY WITH PROPOFOL;  Surgeon: Garlan Fair, MD;  Location: [facility];  Service: Endoscopy;  Laterality: N/A;  . LAPAROSCOPIC CHOLECYSTECTOMY SINGLE PORT  08/04/2012   Procedure: LAPAROSCOPIC CHOLECYSTECTOMY SINGLE PORT;  Surgeon: Adin Hector, MD;  Location: [facility];  Service: General;  Laterality: N/A;  LYSIS OF ADHESIONS with Intraoperative Cholangiogram   . LITHOTRIPSY    . LUMBAR LAMINECTOMY/DECOMPRESSION MICRODISCECTOMY  12/17/2011   Procedure: LUMBAR LAMINECTOMY/DECOMPRESSION MICRODISCECTOMY;  Surgeon: Melina Schools, MD;  Location: [facility];  Service: Orthopedics;  Laterality: Right;  L4-L5 Microdiscectomy  . TONSILLECTOMY         Home Medications    Prior to Admission medications   Medication Sig Start Date End Date Taking? Authorizing Provider  atenolol (TENORMIN) 25 MG tablet Take 25 mg by mouth daily.  04/05/12  Yes Historical Provider, MD  cetirizine (ZYRTEC) 10 MG tablet Take 10 mg by mouth daily.   Yes Historical Provider, MD  doxycycline (VIBRA-TABS) 100 MG tablet Take 100 mg by mouth daily.   Yes Historical Provider, MD  fluticasone (FLONASE) 50 MCG/ACT nasal spray Place 1 spray into both nostrils daily.   Yes Historical Provider, MD  ibuprofen (ADVIL,MOTRIN) 200 MG tablet Take  400 mg by mouth every 6 (six) hours as needed for moderate pain.   Yes Historical Provider, MD  Melatonin 10 MG TABS Take 1 tablet by mouth at bedtime.   Yes Historical Provider, MD  omeprazole (PRILOSEC) 20 MG capsule Take 20 mg by mouth every morning.    Yes Historical Provider, MD  Potassium Citrate 15 MEQ (1620 MG) TBCR Take 1 tablet by mouth daily.   Yes Historical Provider, MD  acetaminophen (TYLENOL) 500 MG tablet Take 500-1,000 mg  by mouth every 6 (six) hours as needed for mild pain.    Historical Provider, MD  aspirin EC 325 MG tablet Take 1 tablet (325 mg total) by mouth daily. Patient not taking: Reported on 06/18/2016 12/23/15   Meredith Pel, MD  methocarbamol (ROBAXIN) 500 MG tablet Take 1 tablet (500 mg total) by mouth 4 (four) times daily. Patient not taking: Reported on 06/18/2016 12/23/15   Meredith Pel, MD  oxyCODONE-acetaminophen (ROXICET) 5-325 MG tablet Take 1-2 tablets by mouth every 6 (six) hours as needed for severe pain. Patient not taking: Reported on 06/18/2016 12/23/15   Meredith Pel, MD  prednisoLONE acetate (PRED FORTE) 1 % ophthalmic suspension Place 1 drop into the left eye 2 (two) times daily.    Historical Provider, MD    Family History Family History  Problem Relation Age of Onset  . Diabetes Mother   . Stroke Father   . Stroke Maternal Grandmother   . Cancer Maternal Grandfather     kidney ca    Social History Social History  Substance Use Topics  . Smoking status: Never Smoker  . Smokeless tobacco: Never Used  . Alcohol use No     Allergies   Patient has no known allergies.   Review of Systems Review of Systems  All other systems negative except as documented in the HPI. All pertinent positives and negatives as reviewed in the HPI. Physical Exam Updated Vital Signs BP 137/69   Pulse 72   Temp 98.3 F (36.8 C) (Oral)   Resp 18   Ht 5\' 9"  (1.753 m)   Wt 95.3 kg   SpO2 100%   BMI 31.01 kg/m   Physical Exam  Constitutional: He is oriented to person, place, and time. He appears well-developed and well-nourished. No distress.  HENT:  Head: Normocephalic and atraumatic.  Mouth/Throat: Oropharynx is clear and moist.  Eyes: Pupils are equal, round, and reactive to light.  Neck: Normal range of motion. Neck supple.  Cardiovascular: Normal rate, regular rhythm and normal heart sounds.  Exam reveals no gallop and no friction rub.   No murmur  heard. Pulmonary/Chest: Effort normal and breath sounds normal. No respiratory distress. He has no wheezes.  Abdominal: Soft. Bowel sounds are normal. He exhibits no distension. There is no tenderness.  Neurological: He is alert and oriented to person, place, and time. He exhibits normal muscle tone. Coordination normal.  Skin: Skin is warm and dry. No rash noted. No erythema.  Psychiatric: He has a normal mood and affect. His behavior is normal.  Nursing note and vitals reviewed.    ED Treatments / Results  Labs (all labs ordered are listed, but only abnormal results are displayed) Labs Reviewed  URINALYSIS, ROUTINE W REFLEX MICROSCOPIC (NOT AT [facility]) - Abnormal; Notable for the following:       Result Value   Hgb urine dipstick MODERATE (*)    All other components within normal limits  URINE MICROSCOPIC-ADD ON - Abnormal; Notable  for the following:    Bacteria, UA FEW (*)    All other components within normal limits    EKG  EKG Interpretation None       Radiology Ct Renal Stone Study  Result Date: 06/18/2016 CLINICAL DATA:  Acute onset of left flank pain, with urinary hesitancy and vomiting. Initial encounter. EXAM: CT ABDOMEN AND PELVIS WITHOUT CONTRAST TECHNIQUE: Multidetector CT imaging of the abdomen and pelvis was performed following the standard protocol without IV contrast. COMPARISON:  CT of the abdomen and pelvis from 11/04/2012, and renal ultrasound performed 09/04/2012 FINDINGS: Lower chest: Minimal bibasilar atelectasis or scarring is noted. Mild coronary artery calcification is noted. Hepatobiliary: A small calcified granuloma is noted within the liver. The liver is otherwise unremarkable. The patient is status post cholecystectomy, with clips noted at the gallbladder fossa. The common bile duct remains normal in caliber. Pancreas: The pancreas is within normal limits. Spleen: The spleen is unremarkable in appearance. Adrenals/Urinary Tract: The adrenal glands are  unremarkable in appearance. There is mild asymmetric prominence of the left ureter, without significant hydronephrosis. This appears to reflect a 7 x 6 mm stone at the left side of the base of the bladder, which may still be obstructing or may have recently passed. Scattered nonobstructing bilateral renal stones are seen, more prominent on the right, measuring up to 5 mm in size. A large 10.0 x 9.1 cm cystic structure along the lateral aspect of the left kidney appears to reflect interval evolution of a previously noted subcapsular hematoma. Stomach/Bowel: The stomach is unremarkable in appearance. The small bowel is within normal limits. The appendix is not visualized; there is no evidence for appendicitis. The colon is largely decompressed and is unremarkable in appearance. Vascular/Lymphatic: Mild calcification is noted along the distal abdominal aorta and its branches. No retroperitoneal or pelvic sidewall lymphadenopathy is seen. Reproductive: The bladder is mildly distended and otherwise grossly unremarkable. The prostate remains normal in size, with scattered calcification. Other: No additional soft tissue abnormalities are seen. Musculoskeletal: No acute osseous abnormalities are identified. A subcortical cyst is noted along the anterior right femoral neck. The visualized musculature is unremarkable in appearance. IMPRESSION: 1. Mild asymmetric prominence of the left ureter, without significant hydronephrosis. This appears reflect a 7 x 6 mm stone at the left side of the base of the bladder, which may still be obstructing or may have recently passed. 2. Scattered nonobstructing bilateral renal stones, more prominent on the right, measuring up to 5 mm in size. 3. Large 10.0 x 9.1 cm cystic structure along the lateral aspect of the left kidney appears to reflect interval evolution of the previously noted subcapsular hematoma. 4. Mild aortic atherosclerosis noted. 5. Mild coronary artery calcifications seen.  Electronically Signed   By: Garald Balding M.D.   On: 06/18/2016 19:54    Procedures Procedures (including critical care time)  Medications Ordered in ED Medications - No data to display   Initial Impression / Assessment and Plan / ED Course  I have reviewed the triage vital signs and the nursing notes.  Pertinent labs & imaging results that were available during my care of the patient were reviewed by me and considered in my medical decision making (see chart for details).  Clinical Course     She has what appears to be kidney stone is passed into the bladder.  I will have him follow-up with urology.  Told to return here as needed.  Patient agrees the plan and all questions were answered  Final Clinical Impressions(s) / ED Diagnoses   Final diagnoses:  Flank pain    New Prescriptions New Prescriptions   No medications on file     Dalia Heading, PA-C 06/18/16 2020    [place], MD 06/18/16 2229

## 2016-06-18 NOTE — ED Triage Notes (Signed)
Pt from home states that he is having L sided flank pain accompanied by urinary hesitancy and vomiting. He states that he has a hx of kidney stones and has had lithotripsy 3 times before. A&Ox4. Ambulatory.

## 2016-06-18 NOTE — Discharge Instructions (Signed)
Follow-up with your urologist.  Return here as needed.  Increase your fluid intake

## 2016-08-16 ENCOUNTER — Telehealth: Payer: Medicare Other | Admitting: Family

## 2016-08-16 DIAGNOSIS — J019 Acute sinusitis, unspecified: Secondary | ICD-10-CM

## 2016-08-16 MED ORDER — AMOXICILLIN-POT CLAVULANATE 875-125 MG PO TABS: 1.0000 | ORAL_TABLET | Freq: Two times a day (BID) | ORAL | 0 refills | Status: DC

## 2016-08-16 NOTE — Progress Notes (Signed)

## 2016-09-15 ENCOUNTER — Telehealth: Payer: Medicare Other | Admitting: Nurse Practitioner

## 2016-09-15 DIAGNOSIS — M545 Low back pain, unspecified: Secondary | ICD-10-CM

## 2016-09-15 MED ORDER — ETODOLAC 300 MG PO CAPS: 300.0000 mg | ORAL_CAPSULE | Freq: Two times a day (BID) | ORAL | 0 refills | Status: AC

## 2016-09-15 MED ORDER — CYCLOBENZAPRINE HCL 10 MG PO TABS: 10.0000 mg | ORAL_TABLET | Freq: Three times a day (TID) | ORAL | 0 refills | Status: AC | PRN

## 2016-09-15 NOTE — Progress Notes (Signed)

## 2016-09-20 DIAGNOSIS — I10 Essential (primary) hypertension: Secondary | ICD-10-CM | POA: Diagnosis not present

## 2016-09-20 DIAGNOSIS — J309 Allergic rhinitis, unspecified: Secondary | ICD-10-CM | POA: Diagnosis not present

## 2016-09-20 DIAGNOSIS — R972 Elevated prostate specific antigen [PSA]: Secondary | ICD-10-CM | POA: Diagnosis not present

## 2016-09-20 DIAGNOSIS — E7800 Pure hypercholesterolemia, unspecified: Secondary | ICD-10-CM | POA: Diagnosis not present

## 2016-09-20 DIAGNOSIS — M109 Gout, unspecified: Secondary | ICD-10-CM | POA: Diagnosis not present

## 2016-09-20 DIAGNOSIS — K219 Gastro-esophageal reflux disease without esophagitis: Secondary | ICD-10-CM | POA: Diagnosis not present

## 2016-09-20 DIAGNOSIS — Z1159 Encounter for screening for other viral diseases: Secondary | ICD-10-CM | POA: Diagnosis not present

## 2016-09-20 DIAGNOSIS — Z0000 Encounter for general adult medical examination without abnormal findings: Secondary | ICD-10-CM | POA: Diagnosis not present

## 2016-09-20 DIAGNOSIS — Z125 Encounter for screening for malignant neoplasm of prostate: Secondary | ICD-10-CM | POA: Diagnosis not present

## 2016-09-20 DIAGNOSIS — I491 Atrial premature depolarization: Secondary | ICD-10-CM | POA: Diagnosis not present

## 2016-09-20 DIAGNOSIS — Z23 Encounter for immunization: Secondary | ICD-10-CM | POA: Diagnosis not present

## 2016-12-25 ENCOUNTER — Telehealth: Payer: Medicare Other | Admitting: Physician Assistant

## 2016-12-25 DIAGNOSIS — M545 Low back pain, unspecified: Secondary | ICD-10-CM

## 2016-12-25 MED ORDER — TIZANIDINE HCL 2 MG PO TABS: 2.0000 mg | ORAL_TABLET | Freq: Three times a day (TID) | ORAL | 0 refills | Status: DC | PRN

## 2016-12-25 NOTE — Progress Notes (Signed)

## 2017-01-21 DIAGNOSIS — D3131 Benign neoplasm of right choroid: Secondary | ICD-10-CM | POA: Diagnosis not present

## 2017-01-21 DIAGNOSIS — Z961 Presence of intraocular lens: Secondary | ICD-10-CM | POA: Diagnosis not present

## 2017-03-23 DIAGNOSIS — N200 Calculus of kidney: Secondary | ICD-10-CM | POA: Diagnosis not present

## 2017-03-23 DIAGNOSIS — R972 Elevated prostate specific antigen [PSA]: Secondary | ICD-10-CM | POA: Diagnosis not present

## 2017-03-23 DIAGNOSIS — N201 Calculus of ureter: Secondary | ICD-10-CM | POA: Diagnosis not present

## 2017-03-31 DIAGNOSIS — R972 Elevated prostate specific antigen [PSA]: Secondary | ICD-10-CM | POA: Diagnosis not present

## 2017-03-31 DIAGNOSIS — N201 Calculus of ureter: Secondary | ICD-10-CM | POA: Diagnosis not present

## 2017-03-31 DIAGNOSIS — S37019A Minor contusion of unspecified kidney, initial encounter: Secondary | ICD-10-CM | POA: Diagnosis not present

## 2017-05-25 DIAGNOSIS — D2262 Melanocytic nevi of left upper limb, including shoulder: Secondary | ICD-10-CM | POA: Diagnosis not present

## 2017-05-25 DIAGNOSIS — L738 Other specified follicular disorders: Secondary | ICD-10-CM | POA: Diagnosis not present

## 2017-05-25 DIAGNOSIS — D1801 Hemangioma of skin and subcutaneous tissue: Secondary | ICD-10-CM | POA: Diagnosis not present

## 2017-05-25 DIAGNOSIS — L821 Other seborrheic keratosis: Secondary | ICD-10-CM | POA: Diagnosis not present

## 2017-05-25 DIAGNOSIS — D225 Melanocytic nevi of trunk: Secondary | ICD-10-CM | POA: Diagnosis not present

## 2017-05-25 DIAGNOSIS — L720 Epidermal cyst: Secondary | ICD-10-CM | POA: Diagnosis not present

## 2017-05-25 DIAGNOSIS — L570 Actinic keratosis: Secondary | ICD-10-CM | POA: Diagnosis not present

## 2017-06-26 ENCOUNTER — Telehealth: Payer: Medicare Other | Admitting: Family

## 2017-06-26 DIAGNOSIS — J019 Acute sinusitis, unspecified: Secondary | ICD-10-CM

## 2017-06-26 MED ORDER — AMOXICILLIN-POT CLAVULANATE 875-125 MG PO TABS: 1.0000 | ORAL_TABLET | Freq: Two times a day (BID) | ORAL | 0 refills | Status: DC

## 2017-06-26 NOTE — Progress Notes (Signed)

## 2017-07-31 ENCOUNTER — Telehealth: Payer: Medicare Other | Admitting: Family

## 2017-07-31 DIAGNOSIS — B9689 Other specified bacterial agents as the cause of diseases classified elsewhere: Secondary | ICD-10-CM

## 2017-07-31 DIAGNOSIS — J329 Chronic sinusitis, unspecified: Secondary | ICD-10-CM

## 2017-07-31 MED ORDER — AMOXICILLIN-POT CLAVULANATE 875-125 MG PO TABS: 1.0000 | ORAL_TABLET | Freq: Two times a day (BID) | ORAL | 0 refills | Status: AC

## 2017-07-31 NOTE — Progress Notes (Signed)

## 2017-10-06 DIAGNOSIS — M109 Gout, unspecified: Secondary | ICD-10-CM | POA: Diagnosis not present

## 2017-10-06 DIAGNOSIS — E7800 Pure hypercholesterolemia, unspecified: Secondary | ICD-10-CM | POA: Diagnosis not present

## 2017-10-06 DIAGNOSIS — R972 Elevated prostate specific antigen [PSA]: Secondary | ICD-10-CM | POA: Diagnosis not present

## 2017-10-06 DIAGNOSIS — I10 Essential (primary) hypertension: Secondary | ICD-10-CM | POA: Diagnosis not present

## 2017-10-06 DIAGNOSIS — I491 Atrial premature depolarization: Secondary | ICD-10-CM | POA: Diagnosis not present

## 2017-10-06 DIAGNOSIS — J309 Allergic rhinitis, unspecified: Secondary | ICD-10-CM | POA: Diagnosis not present

## 2017-10-06 DIAGNOSIS — Z0000 Encounter for general adult medical examination without abnormal findings: Secondary | ICD-10-CM | POA: Diagnosis not present

## 2017-10-06 DIAGNOSIS — K219 Gastro-esophageal reflux disease without esophagitis: Secondary | ICD-10-CM | POA: Diagnosis not present

## 2017-10-06 DIAGNOSIS — L739 Follicular disorder, unspecified: Secondary | ICD-10-CM | POA: Diagnosis not present

## 2017-10-13 DIAGNOSIS — T7840XA Allergy, unspecified, initial encounter: Secondary | ICD-10-CM | POA: Diagnosis not present

## 2017-11-02 DIAGNOSIS — J300 Vasomotor rhinitis: Secondary | ICD-10-CM | POA: Diagnosis not present

## 2017-11-02 DIAGNOSIS — J309 Allergic rhinitis, unspecified: Secondary | ICD-10-CM | POA: Diagnosis not present

## 2018-03-03 DIAGNOSIS — Z961 Presence of intraocular lens: Secondary | ICD-10-CM | POA: Diagnosis not present

## 2018-03-03 DIAGNOSIS — D3131 Benign neoplasm of right choroid: Secondary | ICD-10-CM | POA: Diagnosis not present

## 2018-03-10 DIAGNOSIS — J300 Vasomotor rhinitis: Secondary | ICD-10-CM | POA: Diagnosis not present

## 2018-04-24 DIAGNOSIS — N200 Calculus of kidney: Secondary | ICD-10-CM | POA: Diagnosis not present

## 2018-06-12 ENCOUNTER — Telehealth: Payer: Medicare Other | Admitting: Family

## 2018-06-12 DIAGNOSIS — J019 Acute sinusitis, unspecified: Secondary | ICD-10-CM

## 2018-06-12 DIAGNOSIS — J300 Vasomotor rhinitis: Secondary | ICD-10-CM | POA: Diagnosis not present

## 2018-06-12 MED ORDER — AMOXICILLIN-POT CLAVULANATE 875-125 MG PO TABS: 1.0000 | ORAL_TABLET | Freq: Two times a day (BID) | ORAL | 0 refills | Status: DC

## 2018-06-12 NOTE — Progress Notes (Signed)
We are sorry that you are not feeling well.  Here is how we plan to help!  Based on what you have shared with me it looks like you have sinusitis.  Sinusitis is inflammation and infection in the sinus cavities of the head.  Based on your presentation I believe you most likely have Acute Bacterial Sinusitis.  This is an infection caused by bacteria and is treated with antibiotics. I have prescribed Augmentin 875mg /125mg  one tablet twice daily with food, for 7 days. You may use an oral decongestant such as Mucinex D or if you have glaucoma or high blood pressure use plain Mucinex. Saline nasal spray help and can safely be used as often as needed for congestion.  If you develop worsening sinus pain, fever or notice severe headache and vision changes, or if symptoms are not better after completion of antibiotic, please schedule an appointment with a health care provider.    I am sorry, but can not prescribe prednisone  for sinus symptoms through an evisit.   Sinus infections are not as easily transmitted as other respiratory infection, however we still recommend that you avoid close contact with loved ones, especially the very young and elderly.  Remember to wash your hands thoroughly throughout the day as this is the number one way to prevent the spread of infection!  Home Care:  Only take medications as instructed by your medical team.  Complete the entire course of an antibiotic.  Do not take these medications with alcohol.  A steam or ultrasonic humidifier can help congestion.  You can place a towel over your head and breathe in the steam from hot water coming from a faucet.  Avoid close contacts especially the very young and the elderly.  Cover your mouth when you cough or sneeze.  Always remember to wash your hands.  Get Help Right Away If:  You develop worsening fever or sinus pain.  You develop a severe head ache or visual changes.  Your symptoms persist after you have completed  your treatment plan.  Make sure you  Understand these instructions.  Will watch your condition.  Will get help right away if you are not doing well or get worse.  Your e-visit answers were reviewed by a board certified advanced clinical practitioner to complete your personal care plan.  Depending on the condition, your plan could have included both over the counter or prescription medications.  If there is a problem please reply  once you have received a response from your provider.  Your safety is important to [place].  If you have drug allergies check your prescription carefully.    You can use MyChart to ask questions about today's visit, request a non-urgent call back, or ask for a work or school excuse for 24 hours related to this e-Visit. If it has been greater than 24 hours you will need to follow up with your provider, or enter a new e-Visit to address those concerns.  You will get an e-mail in the next two days asking about your experience.  I hope that your e-visit has been valuable and will speed your recovery. Thank you for using e-visits.

## 2018-07-27 DIAGNOSIS — J300 Vasomotor rhinitis: Secondary | ICD-10-CM | POA: Diagnosis not present

## 2018-07-27 DIAGNOSIS — J069 Acute upper respiratory infection, unspecified: Secondary | ICD-10-CM | POA: Diagnosis not present

## 2018-07-29 ENCOUNTER — Telehealth: Payer: Medicare Other | Admitting: Nurse Practitioner

## 2018-07-29 DIAGNOSIS — J0101 Acute recurrent maxillary sinusitis: Secondary | ICD-10-CM

## 2018-07-29 MED ORDER — DOXYCYCLINE HYCLATE 100 MG PO TABS: 100.0000 mg | ORAL_TABLET | Freq: Two times a day (BID) | ORAL | 0 refills | Status: DC

## 2018-07-29 NOTE — Progress Notes (Signed)
We are sorry that you are not feeling well.  Here is how we plan to help!  Based on what you have shared with me it looks like you have sinusitis.  Sinusitis is inflammation and infection in the sinus cavities of the head.  Based on your presentation I believe you most likely have Acute Bacterial Sinusitis.  This is an infection caused by bacteria and is treated with antibiotics. I have prescribed Doxycycline 100mg  by mouth twice a day for 10 days. You may use an oral decongestant such as Mucinex D or if you have glaucoma or high blood pressure use plain Mucinex. Saline nasal spray help and can safely be used as often as needed for congestion.  If you develop worsening sinus pain, fever or notice severe headache and vision changes, or if symptoms are not better after completion of antibiotic, please schedule an appointment with a health care provider.    This is considered a recurrent sinusitis since you were just treated 1 month ago. ifthis does not resolve it the you will need ot see your PCP.  Sinus infections are not as easily transmitted as other respiratory infection, however we still recommend that you avoid close contact with loved ones, especially the very young and elderly.  Remember to wash your hands thoroughly throughout the day as this is the number one way to prevent the spread of infection!  Home Care:  Only take medications as instructed by your medical team.  Complete the entire course of an antibiotic.  Do not take these medications with alcohol.  A steam or ultrasonic humidifier can help congestion.  You can place a towel over your head and breathe in the steam from hot water coming from a faucet.  Avoid close contacts especially the very young and the elderly.  Cover your mouth when you cough or sneeze.  Always remember to wash your hands.  Get Help Right Away If:  You develop worsening fever or sinus pain.  You develop a severe head ache or visual changes.  Your  symptoms persist after you have completed your treatment plan.  Make sure you  Understand these instructions.  Will watch your condition.  Will get help right away if you are not doing well or get worse.  Your e-visit answers were reviewed by a board certified advanced clinical practitioner to complete your personal care plan.  Depending on the condition, your plan could have included both over the counter or prescription medications.  If there is a problem please reply  once you have received a response from your provider.  Your safety is important to [place].  If you have drug allergies check your prescription carefully.    You can use MyChart to ask questions about today's visit, request a non-urgent call back, or ask for a work or school excuse for 24 hours related to this e-Visit. If it has been greater than 24 hours you will need to follow up with your provider, or enter a new e-Visit to address those concerns.  You will get an e-mail in the next two days asking about your experience.  I hope that your e-visit has been valuable and will speed your recovery. Thank you for using e-visits.

## 2018-08-17 DIAGNOSIS — J300 Vasomotor rhinitis: Secondary | ICD-10-CM | POA: Diagnosis not present

## 2018-09-18 ENCOUNTER — Ambulatory Visit (HOSPITAL_COMMUNITY)
Admission: RE | Admit: 2018-09-18 | Discharge: 2018-09-18 | Disposition: A | Discharge status: Home / Self Care | Payer: PPO | Source: Ambulatory Visit | Attending: Surgery | Admitting: Surgery

## 2018-09-18 ENCOUNTER — Other Ambulatory Visit (HOSPITAL_COMMUNITY): Payer: Self-pay | Admitting: Family Medicine

## 2018-09-18 DIAGNOSIS — M79605 Pain in left leg: Secondary | ICD-10-CM | POA: Diagnosis not present

## 2018-09-20 DIAGNOSIS — J342 Deviated nasal septum: Secondary | ICD-10-CM | POA: Diagnosis not present

## 2018-09-20 DIAGNOSIS — J310 Chronic rhinitis: Secondary | ICD-10-CM | POA: Diagnosis not present

## 2018-09-20 DIAGNOSIS — J343 Hypertrophy of nasal turbinates: Secondary | ICD-10-CM | POA: Diagnosis not present

## 2018-09-28 ENCOUNTER — Other Ambulatory Visit: Payer: Self-pay | Admitting: Otolaryngology

## 2018-09-28 DIAGNOSIS — J329 Chronic sinusitis, unspecified: Secondary | ICD-10-CM

## 2018-10-04 ENCOUNTER — Ambulatory Visit
Admission: RE | Admit: 2018-10-04 | Discharge: 2018-10-04 | Disposition: A | Discharge status: Home / Self Care | Payer: BC Managed Care – PPO | Source: Ambulatory Visit | Attending: Otolaryngology | Admitting: Otolaryngology

## 2018-10-04 DIAGNOSIS — J329 Chronic sinusitis, unspecified: Secondary | ICD-10-CM

## 2018-10-04 DIAGNOSIS — J331 Polypoid sinus degeneration: Secondary | ICD-10-CM | POA: Diagnosis not present

## 2018-10-24 DIAGNOSIS — J342 Deviated nasal septum: Secondary | ICD-10-CM | POA: Diagnosis not present

## 2018-10-24 DIAGNOSIS — J343 Hypertrophy of nasal turbinates: Secondary | ICD-10-CM | POA: Diagnosis not present

## 2018-10-24 DIAGNOSIS — J310 Chronic rhinitis: Secondary | ICD-10-CM | POA: Diagnosis not present

## 2018-10-30 DIAGNOSIS — I10 Essential (primary) hypertension: Secondary | ICD-10-CM | POA: Diagnosis not present

## 2018-10-30 DIAGNOSIS — K219 Gastro-esophageal reflux disease without esophagitis: Secondary | ICD-10-CM | POA: Diagnosis not present

## 2018-10-30 DIAGNOSIS — M109 Gout, unspecified: Secondary | ICD-10-CM | POA: Diagnosis not present

## 2018-10-30 DIAGNOSIS — I809 Phlebitis and thrombophlebitis of unspecified site: Secondary | ICD-10-CM | POA: Diagnosis not present

## 2018-10-30 DIAGNOSIS — E7800 Pure hypercholesterolemia, unspecified: Secondary | ICD-10-CM | POA: Diagnosis not present

## 2018-10-30 DIAGNOSIS — Z0000 Encounter for general adult medical examination without abnormal findings: Secondary | ICD-10-CM | POA: Diagnosis not present

## 2018-10-30 DIAGNOSIS — I491 Atrial premature depolarization: Secondary | ICD-10-CM | POA: Diagnosis not present

## 2018-10-30 DIAGNOSIS — Z87442 Personal history of urinary calculi: Secondary | ICD-10-CM | POA: Diagnosis not present

## 2018-10-30 DIAGNOSIS — J300 Vasomotor rhinitis: Secondary | ICD-10-CM | POA: Diagnosis not present

## 2019-01-05 ENCOUNTER — Other Ambulatory Visit: Payer: Self-pay | Admitting: Otolaryngology

## 2019-01-08 DIAGNOSIS — H938X2 Other specified disorders of left ear: Secondary | ICD-10-CM | POA: Diagnosis not present

## 2019-01-08 DIAGNOSIS — H6122 Impacted cerumen, left ear: Secondary | ICD-10-CM | POA: Diagnosis not present

## 2019-01-19 ENCOUNTER — Encounter (HOSPITAL_BASED_OUTPATIENT_CLINIC_OR_DEPARTMENT_OTHER): Payer: Self-pay | Admitting: *Deleted

## 2019-01-19 ENCOUNTER — Other Ambulatory Visit: Payer: Self-pay

## 2019-01-22 ENCOUNTER — Other Ambulatory Visit: Payer: Self-pay

## 2019-01-22 ENCOUNTER — Encounter (HOSPITAL_BASED_OUTPATIENT_CLINIC_OR_DEPARTMENT_OTHER)
Admission: RE | Admit: 2019-01-22 | Discharge: 2019-01-22 | Disposition: A | Discharge status: Home / Self Care | Payer: PPO | Source: Ambulatory Visit | Attending: General Surgery | Admitting: General Surgery

## 2019-01-22 DIAGNOSIS — Z01812 Encounter for preprocedural laboratory examination: Secondary | ICD-10-CM | POA: Diagnosis not present

## 2019-01-23 ENCOUNTER — Other Ambulatory Visit (HOSPITAL_COMMUNITY)
Admission: RE | Admit: 2019-01-23 | Discharge: 2019-01-23 | Disposition: A | Discharge status: Home / Self Care | Payer: PPO | Source: Ambulatory Visit | Attending: Otolaryngology | Admitting: Otolaryngology

## 2019-01-23 DIAGNOSIS — Z1159 Encounter for screening for other viral diseases: Secondary | ICD-10-CM | POA: Diagnosis not present

## 2019-01-23 LAB — SARS CORONAVIRUS 2 (TAT 6-24 HRS): SARS Coronavirus 2: NEGATIVE

## 2019-01-26 ENCOUNTER — Other Ambulatory Visit: Payer: Self-pay

## 2019-01-26 ENCOUNTER — Ambulatory Visit (HOSPITAL_BASED_OUTPATIENT_CLINIC_OR_DEPARTMENT_OTHER): Payer: PPO | Admitting: Anesthesiology

## 2019-01-26 ENCOUNTER — Ambulatory Visit (HOSPITAL_BASED_OUTPATIENT_CLINIC_OR_DEPARTMENT_OTHER)
Admission: RE | Admit: 2019-01-26 | Discharge: 2019-01-26 | Disposition: A | Discharge status: Home / Self Care | Payer: PPO | Source: Ambulatory Visit | Attending: Otolaryngology | Admitting: Otolaryngology

## 2019-01-26 ENCOUNTER — Encounter (HOSPITAL_BASED_OUTPATIENT_CLINIC_OR_DEPARTMENT_OTHER): Payer: Self-pay

## 2019-01-26 ENCOUNTER — Encounter (HOSPITAL_BASED_OUTPATIENT_CLINIC_OR_DEPARTMENT_OTHER)
Admission: RE | Disposition: A | Discharge status: Home / Self Care | Payer: Self-pay | Source: Ambulatory Visit | Attending: Otolaryngology

## 2019-01-26 DIAGNOSIS — J310 Chronic rhinitis: Secondary | ICD-10-CM | POA: Diagnosis not present

## 2019-01-26 DIAGNOSIS — Z7951 Long term (current) use of inhaled steroids: Secondary | ICD-10-CM | POA: Diagnosis not present

## 2019-01-26 DIAGNOSIS — J342 Deviated nasal septum: Secondary | ICD-10-CM | POA: Diagnosis not present

## 2019-01-26 DIAGNOSIS — I10 Essential (primary) hypertension: Secondary | ICD-10-CM | POA: Insufficient documentation

## 2019-01-26 DIAGNOSIS — K219 Gastro-esophageal reflux disease without esophagitis: Secondary | ICD-10-CM | POA: Diagnosis not present

## 2019-01-26 DIAGNOSIS — J343 Hypertrophy of nasal turbinates: Secondary | ICD-10-CM | POA: Diagnosis not present

## 2019-01-26 HISTORY — PX: NASAL SEPTOPLASTY W/ TURBINOPLASTY: SHX2070

## 2019-01-26 SURGERY — SEPTOPLASTY, NOSE, WITH NASAL TURBINATE REDUCTION
Anesthesia: General | Site: Nose | Laterality: Bilateral

## 2019-01-26 MED ORDER — PROPOFOL 10 MG/ML IV BOLUS
INTRAVENOUS | Status: AC
  Filled 2019-01-26: qty 20

## 2019-01-26 MED ORDER — LIDOCAINE 2% (20 MG/ML) 5 ML SYRINGE
INTRAMUSCULAR | Status: AC
  Filled 2019-01-26: qty 5

## 2019-01-26 MED ORDER — LACTATED RINGERS IV SOLN
INTRAVENOUS | Status: DC
  Administered 2019-01-26: 09:00:00 via INTRAVENOUS

## 2019-01-26 MED ORDER — OXYCODONE-ACETAMINOPHEN 5-325 MG PO TABS: 1.0000 | ORAL_TABLET | ORAL | 0 refills | Status: AC | PRN

## 2019-01-26 MED ORDER — OXYCODONE HCL 5 MG PO TABS
5.0000 mg | ORAL_TABLET | Freq: Once | ORAL | Status: AC
  Administered 2019-01-26: 12:00:00 5 mg via ORAL

## 2019-01-26 MED ORDER — FENTANYL CITRATE (PF) 250 MCG/5ML IJ SOLN
INTRAMUSCULAR | Status: DC | PRN
  Administered 2019-01-26: 100 ug via INTRAVENOUS

## 2019-01-26 MED ORDER — AMOXICILLIN 875 MG PO TABS: 875.0000 mg | ORAL_TABLET | Freq: Two times a day (BID) | ORAL | 0 refills | Status: AC

## 2019-01-26 MED ORDER — ONDANSETRON HCL 4 MG/2ML IJ SOLN: 4.0000 mg | Freq: Once | INTRAMUSCULAR | Status: DC | PRN

## 2019-01-26 MED ORDER — LIDOCAINE-EPINEPHRINE 1 %-1:100000 IJ SOLN
INTRAMUSCULAR | Status: AC
  Filled 2019-01-26: qty 1

## 2019-01-26 MED ORDER — FENTANYL CITRATE (PF) 100 MCG/2ML IJ SOLN: 50.0000 ug | INTRAMUSCULAR | Status: DC | PRN

## 2019-01-26 MED ORDER — SUCCINYLCHOLINE CHLORIDE 200 MG/10ML IV SOSY
PREFILLED_SYRINGE | INTRAVENOUS | Status: AC
  Filled 2019-01-26: qty 10

## 2019-01-26 MED ORDER — LIDOCAINE-EPINEPHRINE 1 %-1:100000 IJ SOLN
INTRAMUSCULAR | Status: DC | PRN
  Administered 2019-01-26: 5 mL

## 2019-01-26 MED ORDER — FENTANYL CITRATE (PF) 100 MCG/2ML IJ SOLN: 25.0000 ug | INTRAMUSCULAR | Status: DC | PRN

## 2019-01-26 MED ORDER — DEXAMETHASONE SODIUM PHOSPHATE 10 MG/ML IJ SOLN
INTRAMUSCULAR | Status: AC
  Filled 2019-01-26: qty 1

## 2019-01-26 MED ORDER — MUPIROCIN 2 % EX OINT
TOPICAL_OINTMENT | CUTANEOUS | Status: DC | PRN
  Administered 2019-01-26: 1 via NASAL

## 2019-01-26 MED ORDER — FENTANYL CITRATE (PF) 100 MCG/2ML IJ SOLN
INTRAMUSCULAR | Status: AC
  Filled 2019-01-26: qty 2

## 2019-01-26 MED ORDER — OXYCODONE HCL 5 MG PO TABS
ORAL_TABLET | ORAL | Status: AC
  Filled 2019-01-26: qty 1

## 2019-01-26 MED ORDER — MIDAZOLAM HCL 2 MG/2ML IJ SOLN: 1.0000 mg | INTRAMUSCULAR | Status: DC | PRN

## 2019-01-26 MED ORDER — ROCURONIUM BROMIDE 10 MG/ML (PF) SYRINGE
PREFILLED_SYRINGE | INTRAVENOUS | Status: AC
  Filled 2019-01-26: qty 10

## 2019-01-26 MED ORDER — PROPOFOL 10 MG/ML IV BOLUS
INTRAVENOUS | Status: DC | PRN
  Administered 2019-01-26: 50 mg via INTRAVENOUS
  Administered 2019-01-26: 150 mg via INTRAVENOUS

## 2019-01-26 MED ORDER — MUPIROCIN 2 % EX OINT
TOPICAL_OINTMENT | CUTANEOUS | Status: AC
  Filled 2019-01-26: qty 22

## 2019-01-26 MED ORDER — MIDAZOLAM HCL 5 MG/5ML IJ SOLN: INTRAMUSCULAR | Status: DC | PRN

## 2019-01-26 MED ORDER — LIDOCAINE 2% (20 MG/ML) 5 ML SYRINGE
INTRAMUSCULAR | Status: DC | PRN
  Administered 2019-01-26: 100 mg via INTRAVENOUS

## 2019-01-26 MED ORDER — ONDANSETRON HCL 4 MG/2ML IJ SOLN
INTRAMUSCULAR | Status: DC | PRN
  Administered 2019-01-26: 4 mg via INTRAVENOUS

## 2019-01-26 MED ORDER — DEXAMETHASONE SODIUM PHOSPHATE 10 MG/ML IJ SOLN
INTRAMUSCULAR | Status: DC | PRN
  Administered 2019-01-26: 10 mg via INTRAVENOUS

## 2019-01-26 MED ORDER — SUCCINYLCHOLINE CHLORIDE 200 MG/10ML IV SOSY
PREFILLED_SYRINGE | INTRAVENOUS | Status: DC | PRN
  Administered 2019-01-26: 100 mg via INTRAVENOUS

## 2019-01-26 MED ORDER — OXYMETAZOLINE HCL 0.05 % NA SOLN
NASAL | Status: DC | PRN
  Administered 2019-01-26: 1 via TOPICAL

## 2019-01-26 MED ORDER — CEFAZOLIN SODIUM-DEXTROSE 2-3 GM-%(50ML) IV SOLR
INTRAVENOUS | Status: DC | PRN
  Administered 2019-01-26: 2 g via INTRAVENOUS

## 2019-01-26 SURGICAL SUPPLY — 36 items
ATTRACTOMAT 16X20 MAGNETIC DRP (DRAPES) IMPLANT
CANISTER SUCT 1200ML W/VALVE (MISCELLANEOUS) ×3 IMPLANT
COAGULATOR SUCT 8FR VV (MISCELLANEOUS) ×3 IMPLANT
COVER WAND RF STERILE (DRAPES) IMPLANT
DECANTER SPIKE VIAL GLASS SM (MISCELLANEOUS) ×2 IMPLANT
DRSG NASOPORE 8CM (GAUZE/BANDAGES/DRESSINGS) IMPLANT
DRSG TELFA 3X8 NADH (GAUZE/BANDAGES/DRESSINGS) IMPLANT
ELECT REM PT RETURN 9FT ADLT (ELECTROSURGICAL) ×3
ELECTRODE REM PT RTRN 9FT ADLT (ELECTROSURGICAL) ×1 IMPLANT
GLOVE BIO SURGEON STRL SZ7.5 (GLOVE) ×3 IMPLANT
GLOVE BIOGEL PI IND STRL 7.0 (GLOVE) IMPLANT
GLOVE BIOGEL PI INDICATOR 7.0 (GLOVE) ×2
GLOVE ECLIPSE 6.5 STRL STRAW (GLOVE) ×2 IMPLANT
GOWN STRL REUS W/ TWL LRG LVL3 (GOWN DISPOSABLE) ×2 IMPLANT
GOWN STRL REUS W/TWL LRG LVL3 (GOWN DISPOSABLE) ×4
NDL HYPO 25X1 1.5 SAFETY (NEEDLE) ×1 IMPLANT
NEEDLE HYPO 25X1 1.5 SAFETY (NEEDLE) ×3 IMPLANT
NS IRRIG 1000ML POUR BTL (IV SOLUTION) ×3 IMPLANT
PACK BASIN DAY SURGERY FS (CUSTOM PROCEDURE TRAY) ×3 IMPLANT
PACK ENT DAY SURGERY (CUSTOM PROCEDURE TRAY) ×3 IMPLANT
PAD DRESSING TELFA 3X8 NADH (GAUZE/BANDAGES/DRESSINGS) IMPLANT
SLEEVE SCD COMPRESS KNEE MED (MISCELLANEOUS) IMPLANT
SOLUTION BUTLER CLEAR DIP (MISCELLANEOUS) ×3 IMPLANT
SPLINT NASAL AIRWAY SILICONE (MISCELLANEOUS) ×2 IMPLANT
SPONGE GAUZE 2X2 8PLY STER LF (GAUZE/BANDAGES/DRESSINGS) ×1
SPONGE GAUZE 2X2 8PLY STRL LF (GAUZE/BANDAGES/DRESSINGS) ×2 IMPLANT
SPONGE NEURO XRAY DETECT 1X3 (DISPOSABLE) ×3 IMPLANT
SUT CHROMIC 4 0 P 3 18 (SUTURE) ×3 IMPLANT
SUT PLAIN 4 0 ~~LOC~~ 1 (SUTURE) ×3 IMPLANT
SUT PROLENE 3 0 PS 2 (SUTURE) ×2 IMPLANT
SUT VIC AB 4-0 P-3 18XBRD (SUTURE) IMPLANT
SUT VIC AB 4-0 P3 18 (SUTURE)
TOWEL GREEN STERILE FF (TOWEL DISPOSABLE) ×3 IMPLANT
TUBE SALEM SUMP 12R W/ARV (TUBING) IMPLANT
TUBE SALEM SUMP 16 FR W/ARV (TUBING) ×3 IMPLANT
YANKAUER SUCT BULB TIP NO VENT (SUCTIONS) ×3 IMPLANT

## 2019-01-26 NOTE — Anesthesia Procedure Notes (Signed)
Procedure Name: Intubation Date/Time: 01/26/2019 9:38 AM Performed by: Myna Bright, CRNA Pre-anesthesia Checklist: Patient identified, Suction available, Emergency Drugs available and Patient being monitored Patient Re-evaluated:Patient Re-evaluated prior to induction Oxygen Delivery Method: Circle system utilized Preoxygenation: Pre-oxygenation with 100% oxygen Induction Type: IV induction Ventilation: Mask ventilation without difficulty Laryngoscope Size: Mac and 4 Grade View: Grade II Tube type: Oral Tube size: 8.0 mm Number of attempts: 1 Airway Equipment and Method: Stylet Placement Confirmation: ETT inserted through vocal cords under direct vision,  positive ETCO2 and breath sounds checked- equal and bilateral Secured at: 21 cm Tube secured with: Tape Dental Injury: Teeth and Oropharynx as per pre-operative assessment

## 2019-01-26 NOTE — Transfer of Care (Signed)
Immediate Anesthesia Transfer of Care Note  Patient: Randall Lopez  Procedure(s) Performed: NASAL SEPTOPLASTY WITH BILATERAL TURBINATE REDUCTION (Bilateral Nose)  Patient Location: PACU  Anesthesia Type:General  Level of Consciousness: awake, alert , oriented and patient cooperative  Airway & Oxygen Therapy: Patient Spontanous Breathing and Patient connected to face mask oxygen  Post-op Assessment: Report given to RN, Post -op Vital signs reviewed and stable and Patient moving all extremities  Post vital signs: Reviewed and stable  Last Vitals:  Vitals Value Taken Time  BP 146/89 01/26/19 1048  Temp    Pulse 109 01/26/19 1050  Resp 17 01/26/19 1050  SpO2 98 % 01/26/19 1050  Vitals shown include unvalidated device data.  Last Pain:  Vitals:   01/26/19 0848  TempSrc: Oral  PainSc: 0-No pain         Complications: No apparent anesthesia complications

## 2019-01-26 NOTE — H&P (Signed)
Cc: Chronic nasal obstruction  HPI: The patient is a 73 year old male who returns today for his follow-up evaluation. The patient was last seen 1 month ago.  At that time, he was complaining of chronic nasal congestion and recurrent sinus infections. The patient was noted to have significant chronic rhinitis with nasal mucosal congestion, septal deviation and bilateral inferior turbinate hypertrophy.  He was evaluated by the allergist.  His allergy testing was negative for environmental allergies.  The patient was placed on Flonase and Zyrtec.  He also underwent a sinus CT scan. The CT showed no significant sinusitis.  However, he does have nasal septal deviation and bilateral inferior turbinate hypertrophy, obstructing his nasal passageways.  The patient returns today complaining of severe nasal congestion and facial pressure.  He would like to have his nasal cavity re-evaluated.  He denies any fever or visual change.  Exam: The nasal cavities were decongested and anesthetised with a combination of oxymetazoline and 4% lidocaine solution.  The flexible scope was inserted into the right nasal cavity.  Endoscopy of the inferior and middle meatus was performed.  Edematous mucosa and significant NSD were noted.  No polyp, mass, or lesion was appreciated.  Olfactory cleft was clear.  Nasopharynx was clear.  Turbinates were hypertrophied but without mass.  Incomplete response to decongestion.  The procedure was repeated on the contralateral side with similar findings.  The patient tolerated the procedure well.  Instructions were given to avoid eating or drinking for 2 hours.    Assessment: 1.  Chronic rhinitis with severe nasal mucosal congestion, nasal septal deviation and bilateral inferior turbinate hypertrophy.  More than 95% of his nasal passages are obstructed.   2.  No significant acute or chronic sinusitis is noted on his sinus CT scan.   Plan: 1.  The nasal endoscopy findings and the CT images are  extensively reviewed with the patient.  2.  The patient is reassured that no acute infection is noted today.  3.  Continue with Flonase nasal spray daily.   4.  In light of his persistent nasal obstruction, he may benefit from undergoing surgical intervention with septoplasty and bilateral inferior turbinate reduction.  The risks, benefits and details of the procedure are reviewed with the patient.  5.  The patient would like to proceed with the procedures.

## 2019-01-26 NOTE — Discharge Instructions (Addendum)

## 2019-01-26 NOTE — Anesthesia Preprocedure Evaluation (Signed)
Anesthesia Evaluation  Patient identified by MRN, date of birth, ID band Patient awake    Reviewed: Allergy & Precautions, NPO status , Patient's Chart, lab work & pertinent test results  Airway Mallampati: II  TM Distance: >3 FB     Dental  (+) Dental Advisory Given   Pulmonary neg pulmonary ROS,    breath sounds clear to auscultation       Cardiovascular hypertension, Pt. on home beta blockers  Rhythm:Regular Rate:Normal     Neuro/Psych negative neurological ROS     GI/Hepatic Neg liver ROS, GERD  ,  Endo/Other  negative endocrine ROS  Renal/GU negative Renal ROS     Musculoskeletal   Abdominal   Peds  Hematology negative hematology ROS (+)   Anesthesia Other Findings   Reproductive/Obstetrics                             Lab Results  Component Value Date   WBC 10.2 12/16/2015   HGB 16.3 12/16/2015   HCT 46.3 12/16/2015   MCV 85.4 12/16/2015   PLT 279 12/16/2015   Lab Results  Component Value Date   CREATININE 1.09 12/16/2015   BUN 16 12/16/2015   NA 139 12/16/2015   K 3.8 12/16/2015   CL 107 12/16/2015   CO2 26 12/16/2015    Anesthesia Physical Anesthesia Plan  ASA: II  Anesthesia Plan: General   Post-op Pain Management:    Induction: Intravenous  PONV Risk Score and Plan: 2 and Dexamethasone, Ondansetron and Treatment may vary due to age or medical condition  Airway Management Planned: Oral ETT  Additional Equipment: None  Intra-op Plan:   Post-operative Plan: Extubation in OR  Informed Consent: I have reviewed the patients History and Physical, chart, labs and discussed the procedure including the risks, benefits and alternatives for the proposed anesthesia with the patient or authorized representative who has indicated his/her understanding and acceptance.     Dental advisory given  Plan Discussed with: CRNA  Anesthesia Plan Comments:          Anesthesia Quick Evaluation

## 2019-01-26 NOTE — Op Note (Signed)
DATE OF PROCEDURE: 01/26/2019  OPERATIVE REPORT   SURGEON: Leta Baptist, MD   PREOPERATIVE DIAGNOSES:  1. Severe nasal septal deviation.  2. Bilateral inferior turbinate hypertrophy.  3. Chronic nasal obstruction.  POSTOPERATIVE DIAGNOSES:  1. Severe nasal septal deviation.  2. Bilateral inferior turbinate hypertrophy.  3. Chronic nasal obstruction.  PROCEDURE PERFORMED:  1. Septoplasty.  2. Bilateral partial inferior turbinate resection.   ANESTHESIA: General endotracheal tube anesthesia.   COMPLICATIONS: None.   ESTIMATED BLOOD LOSS: 50 mL.   INDICATION FOR PROCEDURE: Randall Lopez is a 73 y.o. male with a history of chronic nasal obstruction. The patient was treated with antihistamine, decongestant, and steroid nasal spray. However, the patient continued to be symptomatic. On examination, the patient was noted to have bilateral severe inferior turbinate hypertrophy and significant nasal septal deviation, causing significant nasal obstruction. Based on the above findings, the decision was made for the patient to undergo the above-stated procedures. The risks, benefits, alternatives, and details of the procedures were discussed with the patient. Questions were invited and answered. Informed consent was obtained.   DESCRIPTION OF PROCEDURE: The patient was taken to the operating room and placed supine on the operating table. General endotracheal tube anesthesia was administered by the anesthesiologist. The patient was positioned, and prepped and draped in the standard fashion for nasal surgery. Pledgets soaked with Afrin were placed in both nasal cavities for decongestion. The pledgets were subsequently removed. The FUSION stereotactic image guidance marker was placed. The image guidance system was functional throughout the case.  Examination of the nasal cavity revealed a severe nasal septal deviationd. 1% lidocaine with 1:100,000 epinephrine was injected onto the nasal septum bilaterally.  A hemitransfixion incision was made on the left side. The mucosal flap was carefully elevated on the left side. A cartilaginous incision was made 1 cm superior to the caudal margin of the nasal septum. Mucosal flap was also elevated on the right side in the similar fashion. It should be noted that due to the severe septal deviation, the deviated portion of the cartilaginous and bony septum had to be removed in piecemeal fashion. Once the deviated portions were removed, a straight midline septum was achieved. The septum was then quilted with 4-0 plain gut sutures. The hemitransfixion incision was closed with interrupted 4-0 chromic sutures.   The inferior one half of both hypertrophied inferior turbinate was crossclamped with a Kelly clamp. The inferior one half of each inferior turbinate was then resected with a pair of cross cutting scissors. Hemostasis was achieved with a suction cautery device.  Doyle splints were applied to the nasal septum.  The care of the patient was turned over to the anesthesiologist. The patient was awakened from anesthesia without difficulty. The patient was extubated and transferred to the recovery room in good condition.   OPERATIVE FINDINGS: Severe nasal septal deviation and bilateral inferior turbinate hypertrophy.   SPECIMEN: None.   FOLLOWUP CARE: The patient be discharged home once he is awake and alert. The patient will be placed on Percocet 1 tablets p.o. q.4 hours p.r.n. pain, and amoxicillin 875 mg p.o. b.i.d. for 5 days. The patient will follow up in my office in approximately 1 week for splint removal.   Jametta Moorehead Raynelle Bring, MD

## 2019-01-29 ENCOUNTER — Encounter (HOSPITAL_BASED_OUTPATIENT_CLINIC_OR_DEPARTMENT_OTHER): Payer: Self-pay | Admitting: Otolaryngology

## 2019-01-29 NOTE — Anesthesia Postprocedure Evaluation (Signed)
Anesthesia Post Note  Patient: Randall Lopez  Procedure(s) Performed: NASAL SEPTOPLASTY WITH BILATERAL TURBINATE REDUCTION (Bilateral Nose)     Patient location during evaluation: PACU Anesthesia Type: General Level of consciousness: awake and alert Pain management: pain level controlled Vital Signs Assessment: post-procedure vital signs reviewed and stable Respiratory status: spontaneous breathing, nonlabored ventilation, respiratory function stable and patient connected to nasal cannula oxygen Cardiovascular status: blood pressure returned to baseline and stable Postop Assessment: no apparent nausea or vomiting Anesthetic complications: no    Last Vitals:  Vitals:   01/26/19 1115 01/26/19 1154  BP: (!) 146/82 134/80  Pulse: 93   Resp: 19 18  Temp:  36.8 C  SpO2: 99% 98%    Last Pain:  Vitals:   01/26/19 1154  TempSrc:   PainSc: 7                  Tiajuana Amass

## 2019-02-22 ENCOUNTER — Telehealth: Payer: PPO | Admitting: Physician Assistant

## 2019-02-22 DIAGNOSIS — H5789 Other specified disorders of eye and adnexa: Secondary | ICD-10-CM | POA: Diagnosis not present

## 2019-02-22 NOTE — Progress Notes (Signed)
Message sent to patient requesting further input regarding current symptoms. Awaiting patient response.  

## 2019-02-23 MED ORDER — POLYMYXIN B-TRIMETHOPRIM 10000-0.1 UNIT/ML-% OP SOLN: 1.0000 [drp] | OPHTHALMIC | 0 refills | Status: DC

## 2019-02-23 NOTE — Addendum Note (Signed)
Addended by: Dutch Quint B on: 02/23/2019 08:29 AM   Modules accepted: Orders

## 2019-05-01 DIAGNOSIS — N200 Calculus of kidney: Secondary | ICD-10-CM | POA: Diagnosis not present

## 2019-05-28 DIAGNOSIS — H26491 Other secondary cataract, right eye: Secondary | ICD-10-CM | POA: Diagnosis not present

## 2019-05-28 DIAGNOSIS — Z961 Presence of intraocular lens: Secondary | ICD-10-CM | POA: Diagnosis not present

## 2019-05-28 DIAGNOSIS — D3131 Benign neoplasm of right choroid: Secondary | ICD-10-CM | POA: Diagnosis not present

## 2019-06-05 NOTE — Progress Notes (Signed)
I have spent 5 minutes in review of e-visit questionnaire, review and updating patient chart, medical decision making and response to patient.   Patrena Santalucia Cody Zebulun Deman, PA-C    

## 2019-07-18 ENCOUNTER — Inpatient Hospital Stay (HOSPITAL_COMMUNITY)
Admission: EM | Admit: 2019-07-18 | Discharge: 2019-07-20 | DRG: 247 | Disposition: A | Discharge status: Home / Self Care | Payer: PPO | Attending: Interventional Cardiology | Admitting: Interventional Cardiology

## 2019-07-18 ENCOUNTER — Emergency Department (HOSPITAL_COMMUNITY): Payer: PPO

## 2019-07-18 ENCOUNTER — Other Ambulatory Visit: Payer: Self-pay

## 2019-07-18 ENCOUNTER — Encounter (HOSPITAL_COMMUNITY): Payer: Self-pay | Admitting: Emergency Medicine

## 2019-07-18 ENCOUNTER — Other Ambulatory Visit (HOSPITAL_COMMUNITY): Payer: Self-pay

## 2019-07-18 DIAGNOSIS — R079 Chest pain, unspecified: Secondary | ICD-10-CM | POA: Diagnosis not present

## 2019-07-18 DIAGNOSIS — I2510 Atherosclerotic heart disease of native coronary artery without angina pectoris: Secondary | ICD-10-CM | POA: Diagnosis not present

## 2019-07-18 DIAGNOSIS — E785 Hyperlipidemia, unspecified: Secondary | ICD-10-CM | POA: Diagnosis not present

## 2019-07-18 DIAGNOSIS — I200 Unstable angina: Secondary | ICD-10-CM | POA: Diagnosis not present

## 2019-07-18 DIAGNOSIS — Z87442 Personal history of urinary calculi: Secondary | ICD-10-CM

## 2019-07-18 DIAGNOSIS — R0602 Shortness of breath: Secondary | ICD-10-CM | POA: Diagnosis not present

## 2019-07-18 DIAGNOSIS — Z955 Presence of coronary angioplasty implant and graft: Secondary | ICD-10-CM | POA: Diagnosis not present

## 2019-07-18 DIAGNOSIS — Z825 Family history of asthma and other chronic lower respiratory diseases: Secondary | ICD-10-CM

## 2019-07-18 DIAGNOSIS — K219 Gastro-esophageal reflux disease without esophagitis: Secondary | ICD-10-CM | POA: Diagnosis present

## 2019-07-18 DIAGNOSIS — Z79899 Other long term (current) drug therapy: Secondary | ICD-10-CM | POA: Diagnosis not present

## 2019-07-18 DIAGNOSIS — I214 Non-ST elevation (NSTEMI) myocardial infarction: Secondary | ICD-10-CM | POA: Diagnosis not present

## 2019-07-18 DIAGNOSIS — Z8249 Family history of ischemic heart disease and other diseases of the circulatory system: Secondary | ICD-10-CM

## 2019-07-18 DIAGNOSIS — Z833 Family history of diabetes mellitus: Secondary | ICD-10-CM

## 2019-07-18 DIAGNOSIS — Z20828 Contact with and (suspected) exposure to other viral communicable diseases: Secondary | ICD-10-CM | POA: Diagnosis not present

## 2019-07-18 DIAGNOSIS — I10 Essential (primary) hypertension: Secondary | ICD-10-CM | POA: Diagnosis not present

## 2019-07-18 DIAGNOSIS — Z823 Family history of stroke: Secondary | ICD-10-CM | POA: Diagnosis not present

## 2019-07-18 DIAGNOSIS — I351 Nonrheumatic aortic (valve) insufficiency: Secondary | ICD-10-CM | POA: Diagnosis not present

## 2019-07-18 DIAGNOSIS — I25110 Atherosclerotic heart disease of native coronary artery with unstable angina pectoris: Secondary | ICD-10-CM | POA: Diagnosis present

## 2019-07-18 LAB — BASIC METABOLIC PANEL
Anion gap: 12 (ref 5–15)
BUN: 20 mg/dL (ref 8–23)
CO2: 24 mmol/L (ref 22–32)
Calcium: 9.2 mg/dL (ref 8.9–10.3)
Chloride: 101 mmol/L (ref 98–111)
Creatinine, Ser: 1.19 mg/dL (ref 0.61–1.24)
GFR calc Af Amer: >60 mL/min (ref >60)
GFR calc non Af Amer: >60 mL/min (ref >60)
Glucose, Bld: 117 mg/dL — ABNORMAL HIGH (ref 70–99)
Potassium: 4 mmol/L (ref 3.5–5.1)
Sodium: 137 mmol/L (ref 135–145)

## 2019-07-18 LAB — CBC
HCT: 48.5 % (ref 39.0–52.0)
Hemoglobin: 16.6 g/dL (ref 13.0–17.0)
MCH: 30.3 pg (ref 26.0–34.0)
MCHC: 34.2 g/dL (ref 30.0–36.0)
MCV: 88.7 fL (ref 80.0–100.0)
Platelets: 263 10*3/uL (ref 150–400)
RBC: 5.47 MIL/uL (ref 4.22–5.81)
RDW: 12 % (ref 11.5–15.5)
WBC: 8.8 10*3/uL (ref 4.0–10.5)
nRBC: 0 % (ref 0.0–0.2)

## 2019-07-18 LAB — TROPONIN I (HIGH SENSITIVITY)
Troponin I (High Sensitivity): 233 ng/L (ref <18)
Troponin I (High Sensitivity): 1611 ng/L (ref <18)

## 2019-07-18 LAB — HEPATIC FUNCTION PANEL
ALT: 30 U/L (ref 0–44)
AST: 44 U/L — ABNORMAL HIGH (ref 15–41)
Albumin: 3.9 g/dL (ref 3.5–5.0)
Alkaline Phosphatase: 51 U/L (ref 38–126)
Bilirubin, Direct: 0.1 mg/dL (ref 0.0–0.2)
Indirect Bilirubin: 0.5 mg/dL (ref 0.3–0.9)
Total Bilirubin: 0.6 mg/dL (ref 0.3–1.2)
Total Protein: 6.9 g/dL (ref 6.5–8.1)

## 2019-07-18 LAB — SARS CORONAVIRUS 2 (TAT 6-24 HRS): SARS Coronavirus 2: NEGATIVE

## 2019-07-18 LAB — MAGNESIUM: Magnesium: 1.9 mg/dL (ref 1.7–2.4)

## 2019-07-18 LAB — HEMOGLOBIN A1C
Hgb A1c MFr Bld: 5.5 % (ref 4.8–5.6)
Mean Plasma Glucose: 111.15 mg/dL

## 2019-07-18 LAB — T4, FREE: Free T4: 0.91 ng/dL (ref 0.61–1.12)

## 2019-07-18 MED ORDER — NITROGLYCERIN 0.4 MG SL SUBL: 0.4000 mg | SUBLINGUAL_TABLET | SUBLINGUAL | Status: DC | PRN

## 2019-07-18 MED ORDER — PANTOPRAZOLE SODIUM 40 MG PO TBEC
40.0000 mg | DELAYED_RELEASE_TABLET | Freq: Every day | ORAL | Status: DC
  Administered 2019-07-19 – 2019-07-20 (×2): 40 mg via ORAL
  Filled 2019-07-18 (×2): qty 1

## 2019-07-18 MED ORDER — ALPRAZOLAM 0.25 MG PO TABS: 0.2500 mg | ORAL_TABLET | Freq: Two times a day (BID) | ORAL | Status: DC | PRN

## 2019-07-18 MED ORDER — POTASSIUM CITRATE ER 10 MEQ (1080 MG) PO TBCR
10.0000 meq | EXTENDED_RELEASE_TABLET | Freq: Every day | ORAL | Status: DC
  Administered 2019-07-20: 10 meq via ORAL
  Filled 2019-07-18 (×2): qty 1

## 2019-07-18 MED ORDER — NITROGLYCERIN 2 % TD OINT
1.0000 [in_us] | TOPICAL_OINTMENT | Freq: Four times a day (QID) | TRANSDERMAL | Status: DC
  Administered 2019-07-18 – 2019-07-20 (×3): 1 [in_us] via TOPICAL
  Filled 2019-07-18: qty 1
  Filled 2019-07-18: qty 30

## 2019-07-18 MED ORDER — ONDANSETRON HCL 4 MG/2ML IJ SOLN: 4.0000 mg | Freq: Four times a day (QID) | INTRAMUSCULAR | Status: DC | PRN

## 2019-07-18 MED ORDER — ASPIRIN 81 MG PO CHEW
324.0000 mg | CHEWABLE_TABLET | Freq: Once | ORAL | Status: AC
  Administered 2019-07-18: 324 mg via ORAL
  Filled 2019-07-18: qty 4

## 2019-07-18 MED ORDER — ZOLPIDEM TARTRATE 5 MG PO TABS: 5.0000 mg | ORAL_TABLET | Freq: Every evening | ORAL | Status: DC | PRN

## 2019-07-18 MED ORDER — HEPARIN (PORCINE) 25000 UT/250ML-% IV SOLN
1200.0000 [IU]/h | INTRAVENOUS | Status: DC
  Administered 2019-07-18: 18:00:00 1200 [IU]/h via INTRAVENOUS
  Filled 2019-07-18: qty 250

## 2019-07-18 MED ORDER — LORATADINE 10 MG PO TABS
10.0000 mg | ORAL_TABLET | Freq: Every day | ORAL | Status: DC
  Administered 2019-07-20: 10 mg via ORAL
  Filled 2019-07-18 (×2): qty 1

## 2019-07-18 MED ORDER — ASPIRIN EC 81 MG PO TBEC
81.0000 mg | DELAYED_RELEASE_TABLET | Freq: Every day | ORAL | Status: DC
  Administered 2019-07-19: 09:00:00 81 mg via ORAL
  Filled 2019-07-18: qty 1

## 2019-07-18 MED ORDER — ACETAMINOPHEN 325 MG PO TABS
650.0000 mg | ORAL_TABLET | ORAL | Status: DC | PRN
  Administered 2019-07-18: 650 mg via ORAL
  Filled 2019-07-18: qty 2

## 2019-07-18 MED ORDER — POLYMYXIN B-TRIMETHOPRIM 10000-0.1 UNIT/ML-% OP SOLN
1.0000 [drp] | OPHTHALMIC | Status: DC
  Filled 2019-07-18: qty 10

## 2019-07-18 MED ORDER — HEPARIN BOLUS VIA INFUSION
4000.0000 [IU] | Freq: Once | INTRAVENOUS | Status: AC
  Administered 2019-07-18: 4000 [IU] via INTRAVENOUS
  Filled 2019-07-18: qty 4000

## 2019-07-18 MED ORDER — MELATONIN 3 MG PO TABS
9.0000 mg | ORAL_TABLET | Freq: Every day | ORAL | Status: DC
  Administered 2019-07-18 – 2019-07-19 (×2): 9 mg via ORAL
  Filled 2019-07-18 (×3): qty 3

## 2019-07-18 MED ORDER — ATENOLOL 25 MG PO TABS
25.0000 mg | ORAL_TABLET | Freq: Every day | ORAL | Status: DC
  Administered 2019-07-19: 09:00:00 25 mg via ORAL
  Filled 2019-07-18: qty 1

## 2019-07-18 MED ORDER — SODIUM CHLORIDE 0.9% FLUSH: 3.0000 mL | Freq: Once | INTRAVENOUS | Status: DC

## 2019-07-18 MED ORDER — SODIUM CHLORIDE 0.9 % IV SOLN: INTRAVENOUS | Status: DC

## 2019-07-18 NOTE — ED Triage Notes (Signed)
Pt to ER for evaluation of central chest pain radiating through to back and down left shoulder/arm. Reports onset this morning with nausea and one episode of vomiting. Reports was not exerting himself when the pain started. States took ibuprofen this morning at an attempt to relieve the pain. Reports worst pain was 7/10 described as dull and burning.

## 2019-07-18 NOTE — ED Notes (Signed)
Cardiology at bedside.

## 2019-07-18 NOTE — ED Provider Notes (Signed)
[facility] [place] EMERGENCY DEPARTMENT Provider Note   CSN: [id number] Arrival date & time: 07/18/19  1417     History Chief Complaint  Patient presents with  . Chest Pain    Randall Lopez is a 73 y.o. male.  He is complaining of 3 hours of central chest pain 8 out of 10 intensity that radiated through to his upper back into his shoulders.  It was associated with nausea and one episode of vomiting.  No shortness of breath or diaphoresis.  Never had this before.  He said by the time he presented to triage the pain had resolved.  No known coronary disease.  No recent illness.  The history is provided by the patient.  Chest Pain Pain location:  Substernal area Pain quality: aching   Pain radiates to:  L shoulder, R shoulder and upper back Pain severity:  Severe Onset quality:  Sudden Duration:  3 hours Timing:  Constant Progression:  Resolved Chronicity:  New Context: at rest   Relieved by:  None tried Worsened by:  Nothing Ineffective treatments:  None tried Associated symptoms: back pain, nausea and vomiting   Associated symptoms: no abdominal pain, no claudication, no cough, no diaphoresis, no dysphagia, no fever, no headache and no shortness of breath   Risk factors: hypertension        Past Medical History:  Diagnosis Date  . Cough    low grade fever  . Enlarged prostate   . GERD (gastroesophageal reflux disease)    takes Omeprazole daily  . Gout    hx of,not on any meds  . History of kidney stones    x3 -pass 1, litho x2  . Hypertension    takes Atenolol daily  . Sinus infection    completed z pak 12/16/15.  . Tachycardia     Patient Active Problem List   Diagnosis Date Noted  . Chronic cholecystitis with calculus 05/02/2012  . History of Helicobacter pylori infection ~2004 05/02/2012  . GERD (gastroesophageal reflux disease)   . Hypertension     Past Surgical History:  Procedure Laterality Date  . ANTERIOR CRUCIATE LIGAMENT REPAIR  Left 12/23/2015   Procedure: LEFT KNEE DIAGNOSTIC OPERATIVE ARTHROSCOPY, DEBRIDEMENT, RECONSTRUCTION ANTERIOR CRUCIATE LIGAMENT (ACL) WITH HAMSTRING GRAFT;  Surgeon: Meredith Pel, MD;  Location: [facility];  Service: Orthopedics;  Laterality: Left;  LEFT KNEE DIAGNOSTIC OPERATIVE ARHTROSCOPY, DEBRIDEMENT, ANTERIOR CRUCIATE LIGAMENT RECONSTRUCTION IWTH HAMSTRING AUTOGRAFT.  Marland Kitchen BACK SURGERY    . CATARACT EXTRACTION, BILATERAL     last 09-14-14  . CHOLECYSTECTOMY    . COLONOSCOPY W/ POLYPECTOMY     past hx. polyps-benign  . COLONOSCOPY WITH PROPOFOL N/A 10/14/2014   Procedure: COLONOSCOPY WITH PROPOFOL;  Surgeon: Garlan Fair, MD;  Location: [facility];  Service: Endoscopy;  Laterality: N/A;  . LAPAROSCOPIC CHOLECYSTECTOMY SINGLE PORT  08/04/2012   Procedure: LAPAROSCOPIC CHOLECYSTECTOMY SINGLE PORT;  Surgeon: Adin Hector, MD;  Location: [facility];  Service: General;  Laterality: N/A;  LYSIS OF ADHESIONS with Intraoperative Cholangiogram   . LITHOTRIPSY    . LUMBAR LAMINECTOMY/DECOMPRESSION MICRODISCECTOMY  12/17/2011   Procedure: LUMBAR LAMINECTOMY/DECOMPRESSION MICRODISCECTOMY;  Surgeon: Melina Schools, MD;  Location: [facility];  Service: Orthopedics;  Laterality: Right;  L4-L5 Microdiscectomy  . NASAL SEPTOPLASTY W/ TURBINOPLASTY Bilateral 01/26/2019   Procedure: NASAL SEPTOPLASTY WITH BILATERAL TURBINATE REDUCTION;  Surgeon: Leta Baptist, MD;  Location: [facility];  Service: ENT;  Laterality: Bilateral;  . TONSILLECTOMY  Family History  Problem Relation Age of Onset  . Diabetes Mother   . Stroke Father   . Stroke Maternal Grandmother   . Cancer Maternal Grandfather        kidney ca    Social History   Tobacco Use  . Smoking status: Never Smoker  . Smokeless tobacco: Never Used  Substance Use Topics  . Alcohol use: No  . Drug use: No    Home Medications Prior to Admission medications   Medication Sig Start Date End Date Taking? Authorizing Provider  atenolol  (TENORMIN) 25 MG tablet Take 25 mg by mouth daily.  04/05/12   [provider]  cetirizine (ZYRTEC) 10 MG tablet Take 10 mg by mouth daily.    [provider]  Melatonin 10 MG TABS Take 1 tablet by mouth at bedtime.    [provider]  omeprazole (PRILOSEC) 20 MG capsule Take 20 mg by mouth every morning.     [provider]  Potassium Citrate 15 MEQ (1620 MG) TBCR Take 1 tablet by mouth daily.    [provider]  trimethoprim-polymyxin b (POLYTRIM) ophthalmic solution Place 1 drop into both eyes every 4 (four) hours. 02/23/19   Kennyth Arnold, FNP    Allergies    Patient has no known allergies.  Review of Systems   Review of Systems  Constitutional: Negative for diaphoresis and fever.  HENT: Negative for sore throat and trouble swallowing.   Eyes: Negative for visual disturbance.  Respiratory: Negative for cough and shortness of breath.   Cardiovascular: Positive for chest pain. Negative for claudication.  Gastrointestinal: Positive for nausea and vomiting. Negative for abdominal pain.  Genitourinary: Negative for dysuria.  Musculoskeletal: Positive for back pain.  Skin: Negative for rash.  Neurological: Negative for headaches.    Physical Exam Updated Vital Signs BP (!) 150/80 (BP Location: Right Arm)   Pulse 88   Temp 98.2 F (36.8 C) (Oral)   Resp 18   SpO2 99%   Physical Exam Vitals and nursing note reviewed.  Constitutional:      Appearance: He is well-developed.  HENT:     Head: Normocephalic and atraumatic.  Eyes:     Conjunctiva/sclera: Conjunctivae normal.  Cardiovascular:     Rate and Rhythm: Normal rate and regular rhythm.     Pulses: Normal pulses.     Heart sounds: Normal heart sounds. No murmur.  Pulmonary:     Effort: Pulmonary effort is normal. No respiratory distress.     Breath sounds: Normal breath sounds.  Abdominal:     Palpations: Abdomen is soft. There is no mass.     Tenderness: There is no  abdominal tenderness. There is no guarding or rebound.  Musculoskeletal:        General: Normal range of motion.     Cervical back: Neck supple.     Right lower leg: No tenderness. No edema.     Left lower leg: No tenderness. No edema.  Skin:    General: Skin is warm and dry.     Capillary Refill: Capillary refill takes less than 2 seconds.  Neurological:     General: No focal deficit present.     Mental Status: He is alert.     ED Results / Procedures / Treatments   Labs (all labs ordered are listed, but only abnormal results are displayed) Labs Reviewed  BASIC METABOLIC PANEL - Abnormal; Notable for the following components:      Result Value  Glucose, Bld 117 (*)    All other components within normal limits  HEPATIC FUNCTION PANEL - Abnormal; Notable for the following components:   AST 44 (*)    All other components within normal limits  BASIC METABOLIC PANEL - Abnormal; Notable for the following components:   Glucose, Bld 106 (*)    GFR calc non Af Amer 60 (*)    All other components within normal limits  LIPID PANEL - Abnormal; Notable for the following components:   HDL 34 (*)    All other components within normal limits  TROPONIN I (HIGH SENSITIVITY) - Abnormal; Notable for the following components:   Troponin I (High Sensitivity) 233 (*)    All other components within normal limits  TROPONIN I (HIGH SENSITIVITY) - Abnormal; Notable for the following components:   Troponin I (High Sensitivity) 1,611 (*)    All other components within normal limits  SARS CORONAVIRUS 2 (TAT 6-24 HRS)  CBC  HEPARIN LEVEL (UNFRACTIONATED)  CBC  MAGNESIUM  T4, FREE  HEMOGLOBIN A1C  PROTIME-INR  TSH  HEPARIN LEVEL (UNFRACTIONATED)    EKG EKG Interpretation  Date/Time:  Wednesday July 18 2019 15:35:48 EST Ventricular Rate:  72 PR Interval:    QRS Duration: 88 QT Interval:  389 QTC Calculation: 426 R Axis:   72 Text Interpretation: Sinus rhythm Minimal ST elevation,  inferior leads No significant change since prior 6/20 Confirmed by Aletta Edouard [phone]) on 07/18/2019 3:41:38 PM   Radiology DG Chest 2 View  Result Date: 07/18/2019 CLINICAL DATA:  Chest pain EXAM: CHEST - 2 VIEW COMPARISON:  Dec 16, 2015 FINDINGS: The heart size and mediastinal contours are within normal limits. Both lungs are clear. The visualized skeletal structures are unremarkable. IMPRESSION: No active cardiopulmonary disease. Electronically Signed   By: Dorise Bullion III M.D   On: 07/18/2019 14:57    Procedures .Critical Care Performed by: Hayden Rasmussen, MD Authorized by: Hayden Rasmussen, MD   Critical care provider statement:    Critical care time (minutes):  45   Critical care time was exclusive of:  Separately billable procedures and treating other patients   Critical care was necessary to treat or prevent imminent or life-threatening deterioration of the following conditions:  Cardiac failure   Critical care was time spent personally by me on the following activities:  Discussions with consultants, evaluation of patient's response to treatment, examination of patient, ordering and performing treatments and interventions, ordering and review of laboratory studies, ordering and review of radiographic studies, pulse oximetry, re-evaluation of patient's condition, obtaining history from patient or surrogate, review of old charts and development of treatment plan with patient or surrogate   I assumed direction of critical care for this patient from another provider in my specialty: no     (including critical care time)  Medications Ordered in ED Medications  sodium chloride flush (NS) 0.9 % injection 3 mL (has no administration in time range)  nitroGLYCERIN (NITROGLYN) 2 % ointment 1 inch (1 inch Topical Given 07/18/19 1727)  aspirin chewable tablet 324 mg (324 mg Oral Given 07/18/19 1555)    ED Course  I have reviewed the triage vital signs and the nursing  notes.  Pertinent labs & imaging results that were available during my care of the patient were reviewed by me and considered in my medical decision making (see chart for details).  Clinical Course as of Jul 17 1758  Wed Jul 17, 3037  [id number] 73 year old male with history of  hypertension here with 3 hours of substernal chest pain with associated nausea vomiting that has resolved.  Differential includes ACS, pneumothorax, PE, vascular disease, reflux.   [MB]  [id number] Chest x-ray interpreted by me as no pneumothorax normal mediastinum no gross infiltrates.   [MB]  1606 Troponin markedly elevated at 233.  Normal renal function.  Normal hemoglobin.  I have ordered a Covid test and have put in a cardiology consult.  Patient is already received aspirin.  Currently chest pain-free so we will hold off on heparin until discussed with cardiology.   [MB]  [id number] Cardiology admitting the patient for anticipated catheterization tomorrow.   [MB]    Clinical Course User Index [MB] Hayden Rasmussen, MD   MDM Rules/Calculators/A&P                       Final Clinical Impression(s) / ED Diagnoses Final diagnoses:  NSTEMI (non-ST elevated myocardial infarction) [facility])    Rx / DC Orders ED Discharge Orders    None       Hayden Rasmussen, MD 07/19/19 1000

## 2019-07-18 NOTE — H&P (Addendum)
The patient has been seen in conjunction with Randall Kicks, NP. All aspects of care have been considered and discussed. The patient has been personally interviewed, examined, and all clinical data has been reviewed.  Chest discomfort radiating to left shoulder and back, resolving after 2 hours. ECG unremarkable Elevated initial troponin I. Concern for NSTEMI. RF profile includes age, Htn, and lipids. Plan continue ECG and hs-troponin I rule out. Leave NPO after midnight for w/u tomorrow, and IV heparin.     Cardiology Admission History and Physical:   Patient ID: [name] MRN: [id number]; DOB: 11-12-1945   Admission date: 07/18/2019  Primary Care Provider: Mayra Neer, MD Primary Cardiologist: No primary care provider on file. new Primary Electrophysiologist:  None   Chief Complaint:  Chest pain   Patient Profile:   Randall Lopez is a 73 y.o. male with hx of GERD, gout, HTN, and tachycardia now presents with chest pain.   History of Present Illness:   Randall Lopez now presents with chest pain, no prior cardiac studies except monitor 10 years or more ago.  This was for palpitations which were PACs.  No recent chest pain or palpitations.   Yesterday was feeling well, fairly active. Today he was doing normal activities - had hair cut then he had nausea then dome epigastric discomfort then up into chest  Centrally and into back.  He took 2 Ibuprofen.  He has stomach issues which he thought this was.   Until it went into chest.  Once in ER- driven here by wife, pain began subsiding.  He was given ASA here 324 mg.    Currently no angina.  Has not had fever, but has had sinus congestion recently.  On zyrtec.    A couple of days ago did have 15 sec of double vision, but no further episodes.    First Troponin  233 Na 137, K+ 4.0, Glucose 117, Cr 1.19  WBC 8.8 hgb 16.6 plts 263     CXR 2 V, no active cardio pulmonary disease  EKG:  The ECG that was done SR with early repol  in II but similar to EKG in 01/2019 was personally reviewed   Heart Pathway Score:     Past Medical History:  Diagnosis Date   Cough    low grade fever   Enlarged prostate    GERD (gastroesophageal reflux disease)    takes Omeprazole daily   Gout    hx of,not on any meds   History of kidney stones    x3 -pass 1, litho x2   Hypertension    takes Atenolol daily   Sinus infection    completed z pak 12/16/15.   Tachycardia     Past Surgical History:  Procedure Laterality Date   ANTERIOR CRUCIATE LIGAMENT REPAIR Left 12/23/2015   Procedure: LEFT KNEE DIAGNOSTIC OPERATIVE ARTHROSCOPY, DEBRIDEMENT, RECONSTRUCTION ANTERIOR CRUCIATE LIGAMENT (ACL) WITH HAMSTRING GRAFT;  Surgeon: Meredith Pel, MD;  Location: [place];  Service: Orthopedics;  Laterality: Left;  LEFT KNEE DIAGNOSTIC OPERATIVE ARHTROSCOPY, DEBRIDEMENT, ANTERIOR CRUCIATE LIGAMENT RECONSTRUCTION IWTH HAMSTRING AUTOGRAFT.   BACK SURGERY     CATARACT EXTRACTION, BILATERAL     last 09-14-14   CHOLECYSTECTOMY     COLONOSCOPY W/ POLYPECTOMY     past hx. polyps-benign   COLONOSCOPY WITH PROPOFOL N/A 10/14/2014   Procedure: COLONOSCOPY WITH PROPOFOL;  Surgeon: Garlan Fair, MD;  Location: [facility];  Service: Endoscopy;  Laterality: N/A;   LAPAROSCOPIC CHOLECYSTECTOMY SINGLE PORT  08/04/2012   Procedure: LAPAROSCOPIC CHOLECYSTECTOMY SINGLE PORT;  Surgeon: Adin Hector, MD;  Location: [facility];  Service: General;  Laterality: N/A;  LYSIS OF ADHESIONS with Intraoperative Cholangiogram    LITHOTRIPSY     LUMBAR LAMINECTOMY/DECOMPRESSION MICRODISCECTOMY  12/17/2011   Procedure: LUMBAR LAMINECTOMY/DECOMPRESSION MICRODISCECTOMY;  Surgeon: Melina Schools, MD;  Location: [facility];  Service: Orthopedics;  Laterality: Right;  L4-L5 Microdiscectomy   NASAL SEPTOPLASTY W/ TURBINOPLASTY Bilateral 01/26/2019   Procedure: NASAL SEPTOPLASTY WITH BILATERAL TURBINATE REDUCTION;  Surgeon: Leta Baptist, MD;  Location: [place];  Service:  ENT;  Laterality: Bilateral;   TONSILLECTOMY       Medications Prior to Admission: Prior to Admission medications   Medication Sig Start Date End Date Taking? Authorizing Provider  atenolol (TENORMIN) 25 MG tablet Take 25 mg by mouth daily.  04/05/12   [provider]  cetirizine (ZYRTEC) 10 MG tablet Take 10 mg by mouth daily.    [provider]  Melatonin 10 MG TABS Take 1 tablet by mouth at bedtime.    [provider]  omeprazole (PRILOSEC) 20 MG capsule Take 20 mg by mouth every morning.     [provider]  Potassium Citrate 15 MEQ (1620 MG) TBCR Take 1 tablet by mouth daily.    [provider]  trimethoprim-polymyxin b (POLYTRIM) ophthalmic solution Place 1 drop into both eyes every 4 (four) hours. 02/23/19   Kennyth Arnold, FNP     Allergies:   No Known Allergies  Social History:   Social History   Socioeconomic History   Marital status: Married    Spouse name: Not on file   Number of children: Not on file   Years of education: Not on file   Highest education level: Not on file  Occupational History   Not on file  Tobacco Use   Smoking status: Never Smoker   Smokeless tobacco: Never Used  Substance and Sexual Activity   Alcohol use: No   Drug use: No   Sexual activity: Yes  Other Topics Concern   Not on file  Social History Narrative   Not on file   Social Determinants of Health   Financial Resource Strain:    Difficulty of Paying Living Expenses: Not on file  Food Insecurity:    Worried About [place] in the Last Year: Not on file   Ran Out of Food in the Last Year: Not on file  Transportation Needs:    Lack of Transportation (Medical): Not on file   Lack of Transportation (Non-Medical): Not on file  Physical Activity:    Days of Exercise per Week: Not on file   Minutes of Exercise per Session: Not on file  Stress:    Feeling of Stress : Not on file  Social Connections:    Frequency of Communication  with Friends and Family: Not on file   Frequency of Social Gatherings with Friends and Family: Not on file   Attends Religious Services: Not on file   Active Member of Clubs or Organizations: Not on file   Attends Archivist Meetings: Not on file   Marital Status: Not on file  Intimate Partner Violence:    Fear of Current or Ex-Partner: Not on file   Emotionally Abused: Not on file   Physically Abused: Not on file   Sexually Abused: Not on file    Family History:   The patient's family history includes COPD in his mother; Cancer in  his maternal grandfather; Diabetes in his mother; Heart disease in his maternal uncle; Hypertension in his mother; Stroke in his father and maternal grandmother.    ROS:  Please see the history of present illness.  General:no colds or fevers, no weight changes Skin:no rashes or ulcers HEENT:no blurred vision, no congestion, brief 15 sec of double vision  CV:see HPI PUL:see HPI GI:no diarrhea constipation or melena, no indigestion occ upper GI issues GU:no hematuria, no dysuria MS:no joint pain, no claudication Neuro:no syncope, no lightheadedness Endo:no diabetes, no thyroid disease  All other ROS reviewed and negative.     Physical Exam/Data:   Vitals:   07/18/19 1530 07/18/19 1545 07/18/19 1615 07/18/19 1630  BP: 131/75 140/64 (!) 148/65 (!) 154/66  Pulse: 78 66 77 79  Resp: 18 (!) 22  (!) 25  Temp:      TempSrc:      SpO2: 98% 98% 100% 100%   No intake or output data in the 24 hours ending 07/18/19 1648 Last 3 Weights 01/26/2019 01/19/2019 06/18/2016  Weight (lbs) 208 lb 15.9 oz 210 lb 210 lb  Weight (kg) 94.8 kg 95.255 kg 95.255 kg     There is no height or weight on file to calculate BMI.  General:  Well nourished, well developed, in no acute distress HEENT: normal Lymph: no adenopathy Neck: no JVD Endocrine:  No thryomegaly Vascular: No carotid bruits; pedal pulses 2+ bilaterally  Cardiac:  normal S1, S2; RRR; no murmur  gallup rub or click Lungs:  clear to auscultation bilaterally, though occwheeze, no  rhonchi or rales  Abd: soft, nontender, no hepatomegaly  Ext: no lower ext edema- wears support stocking on Lt leg due to superficial vein thrombosis involving the left great saphenous vein in the mid calf, and left varicosities in the posterior calf. No DVT. Musculoskeletal:  No deformities, BUE and BLE strength normal and equal Skin: warm and dry  Neuro:  Alert and oriented X 3 MAE follows commands, no focal abnormalities noted Psych:  Normal affect but anxious.     Relevant CV Studies: none  Laboratory Data:  High Sensitivity Troponin:   Recent Labs  Lab 07/18/19 1434  TROPONINIHS 233*      Chemistry Recent Labs  Lab 07/18/19 1434  NA 137  K 4.0  CL 101  CO2 24  GLUCOSE 117*  BUN 20  CREATININE 1.19  CALCIUM 9.2  GFRNONAA >60  GFRAA >60  ANIONGAP 12    No results for input(s): PROT, ALBUMIN, AST, ALT, ALKPHOS, BILITOT in the last 168 hours. Hematology Recent Labs  Lab 07/18/19 1434  WBC 8.8  RBC 5.47  HGB 16.6  HCT 48.5  MCV 88.7  MCH 30.3  MCHC 34.2  RDW 12.0  PLT 263   BNPNo results for input(s): BNP, PROBNP in the last 168 hours.  DDimer No results for input(s): DDIMER in the last 168 hours.   Radiology/Studies:  DG Chest 2 View  Result Date: 07/18/2019 CLINICAL DATA:  Chest pain EXAM: CHEST - 2 VIEW COMPARISON:  Dec 16, 2015 FINDINGS: The heart size and mediastinal contours are within normal limits. Both lungs are clear. The visualized skeletal structures are unremarkable. IMPRESSION: No active cardiopulmonary disease. Electronically Signed   By: Dorise Bullion III M.D   On: 07/18/2019 14:57    Assessment and Plan:   Chest pain with elevated troponin, second troponin pending.  Pain resolved,  Dr. Tamala Julian to see.  Depending on second troponin may need cardiac cath, IV  heparin.  Continue BB and add statin along with daily ASA.  HTN on atenolol 25 daily, change to  lopressor add NTG patch  Possible HLD check lipids and add statin GERD on prilosec will be protonix here.    Severity of Illness: The appropriate patient status for this patient is INPATIENT. Inpatient status is judged to be reasonable and necessary in order to provide the required intensity of service to ensure the patient's safety. The patient's presenting symptoms, physical exam findings, and initial radiographic and laboratory data in the context of their chronic comorbidities is felt to place them at high risk for further clinical deterioration. Furthermore, it is not anticipated that the patient will be medically stable for discharge from the hospital within 2 midnights of admission. The following factors support the patient status of inpatient.   " The patient's presenting symptoms include unstable angina. " The worrisome physical exam findings include none. " The initial radiographic and laboratory data are worrisome because of elevated troponin. " The chronic co-morbidities include HTN age.   * I certify that at the point of admission it is my clinical judgment that the patient will require inpatient hospital care spanning beyond 2 midnights from the point of admission due to high intensity of service, high risk for further deterioration and high frequency of surveillance required.*    For questions or updates, please contact [place] Please consult [email or web address] for contact info under        Signed, Randall Kicks, NP  07/18/2019 4:48 PM

## 2019-07-18 NOTE — Progress Notes (Signed)
[place] for heparin Indication: chest pain/ACS  Heparin Dosing Weight: 89.8 kg  Labs: Recent Labs    07/18/19 1434 07/18/19 1633  HGB 16.6  --   HCT 48.5  --   PLT 263  --   CREATININE 1.19  --   TROPONINIHS 233* 1,611*    CrCl cannot be calculated (Unknown ideal weight.).  Assessment: 63 yom presenting with CP, elevated high-sensitivity troponin. Pharmacy consulted to dose heparin. Patient is not on anticoagulation PTA. CBC wnl. No active bleed issues documented  Goal of Therapy:  Heparin level 0.3-0.7 units/ml Monitor platelets by anticoagulation protocol: Yes   Plan:  Heparin 4000 unit bolus Start heparin at 1200 units/h 6h heparin level Daily heparin level/CBC Monitor s/sx bleeding F/u Cardiology plans   Elicia Lamp, PharmD, BCPS Clinical Pharmacist 06/24/2019 8:49 PM

## 2019-07-18 NOTE — ED Notes (Signed)
Melina Copa MD aware of troponin

## 2019-07-18 NOTE — ED Notes (Signed)
Date and time results received: 07/18/19    Test: Troponin Critical Value: 233  Name of Provider Notified: Melina Copa

## 2019-07-19 ENCOUNTER — Inpatient Hospital Stay (HOSPITAL_COMMUNITY): Payer: PPO

## 2019-07-19 ENCOUNTER — Encounter (HOSPITAL_COMMUNITY)
Admission: EM | Disposition: A | Discharge status: Home / Self Care | Payer: Self-pay | Source: Home / Self Care | Attending: Interventional Cardiology

## 2019-07-19 DIAGNOSIS — E785 Hyperlipidemia, unspecified: Secondary | ICD-10-CM

## 2019-07-19 DIAGNOSIS — I10 Essential (primary) hypertension: Secondary | ICD-10-CM

## 2019-07-19 DIAGNOSIS — I351 Nonrheumatic aortic (valve) insufficiency: Secondary | ICD-10-CM

## 2019-07-19 DIAGNOSIS — I2510 Atherosclerotic heart disease of native coronary artery without angina pectoris: Secondary | ICD-10-CM

## 2019-07-19 HISTORY — PX: INTRAVASCULAR PRESSURE WIRE/FFR STUDY: CATH118243

## 2019-07-19 HISTORY — PX: LEFT HEART CATH AND CORONARY ANGIOGRAPHY: CATH118249

## 2019-07-19 HISTORY — PX: CORONARY STENT INTERVENTION: CATH118234

## 2019-07-19 HISTORY — PX: CORONARY THROMBECTOMY: CATH118304

## 2019-07-19 LAB — BASIC METABOLIC PANEL
Anion gap: 11 (ref 5–15)
BUN: 20 mg/dL (ref 8–23)
CO2: 24 mmol/L (ref 22–32)
Calcium: 9 mg/dL (ref 8.9–10.3)
Chloride: 107 mmol/L (ref 98–111)
Creatinine, Ser: 1.2 mg/dL (ref 0.61–1.24)
GFR calc Af Amer: >60 mL/min (ref >60)
GFR calc non Af Amer: 60 mL/min — ABNORMAL LOW (ref >60)
Glucose, Bld: 106 mg/dL — ABNORMAL HIGH (ref 70–99)
Potassium: 3.9 mmol/L (ref 3.5–5.1)
Sodium: 142 mmol/L (ref 135–145)

## 2019-07-19 LAB — ECHOCARDIOGRAM COMPLETE
Height: 69 in
Weight: 3403.2 oz

## 2019-07-19 LAB — CBC
HCT: 43.8 % (ref 39.0–52.0)
Hemoglobin: 15.3 g/dL (ref 13.0–17.0)
MCH: 30.3 pg (ref 26.0–34.0)
MCHC: 34.9 g/dL (ref 30.0–36.0)
MCV: 86.7 fL (ref 80.0–100.0)
Platelets: 231 10*3/uL (ref 150–400)
RBC: 5.05 MIL/uL (ref 4.22–5.81)
RDW: 12.2 % (ref 11.5–15.5)
WBC: 9.6 10*3/uL (ref 4.0–10.5)
nRBC: 0 % (ref 0.0–0.2)

## 2019-07-19 LAB — POCT ACTIVATED CLOTTING TIME
Activated Clotting Time: 263 seconds
Activated Clotting Time: 351 seconds
Activated Clotting Time: 355 seconds
Activated Clotting Time: 356 seconds

## 2019-07-19 LAB — PROTIME-INR
INR: 1.1 (ref 0.8–1.2)
Prothrombin Time: 13.9 seconds (ref 11.4–15.2)

## 2019-07-19 LAB — LIPID PANEL
Cholesterol: 146 mg/dL (ref 0–200)
HDL: 34 mg/dL — ABNORMAL LOW (ref >40)
LDL Cholesterol: 89 mg/dL (ref 0–99)
Total CHOL/HDL Ratio: 4.3 RATIO
Triglycerides: 114 mg/dL (ref <150)
VLDL: 23 mg/dL (ref 0–40)

## 2019-07-19 LAB — HEPARIN LEVEL (UNFRACTIONATED): Heparin Unfractionated: 0.31 IU/mL (ref 0.30–0.70)

## 2019-07-19 LAB — TSH: TSH: 3.317 u[IU]/mL (ref 0.350–4.500)

## 2019-07-19 SURGERY — LEFT HEART CATH AND CORONARY ANGIOGRAPHY
Anesthesia: LOCAL

## 2019-07-19 MED ORDER — HEPARIN SODIUM (PORCINE) 1000 UNIT/ML IJ SOLN
INTRAMUSCULAR | Status: AC
  Filled 2019-07-19: qty 1

## 2019-07-19 MED ORDER — MIDAZOLAM HCL 2 MG/2ML IJ SOLN
INTRAMUSCULAR | Status: AC
  Filled 2019-07-19: qty 2

## 2019-07-19 MED ORDER — ATORVASTATIN CALCIUM 40 MG PO TABS: 40.0000 mg | ORAL_TABLET | Freq: Every day | ORAL | Status: DC

## 2019-07-19 MED ORDER — ADENOSINE 6 MG/2ML IV SOLN
INTRAVENOUS | Status: AC
  Filled 2019-07-19: qty 2

## 2019-07-19 MED ORDER — NITROGLYCERIN 1 MG/10 ML FOR IR/CATH LAB
INTRA_ARTERIAL | Status: DC | PRN
  Administered 2019-07-19 (×2): 200 ug via INTRACORONARY

## 2019-07-19 MED ORDER — IOHEXOL 350 MG/ML SOLN
INTRAVENOUS | Status: DC | PRN
  Administered 2019-07-19: 280 mL

## 2019-07-19 MED ORDER — SODIUM CHLORIDE 0.9 % WEIGHT BASED INFUSION: 3.0000 mL/kg/h | INTRAVENOUS | Status: DC

## 2019-07-19 MED ORDER — SODIUM CHLORIDE 0.9 % WEIGHT BASED INFUSION
1.0000 mL/kg/h | INTRAVENOUS | Status: DC
  Administered 2019-07-19: 1 mL/kg/h via INTRAVENOUS

## 2019-07-19 MED ORDER — VERAPAMIL HCL 2.5 MG/ML IV SOLN
INTRAVENOUS | Status: DC | PRN
  Administered 2019-07-19: 11:00:00 10 mL via INTRA_ARTERIAL

## 2019-07-19 MED ORDER — FENTANYL CITRATE (PF) 100 MCG/2ML IJ SOLN
INTRAMUSCULAR | Status: DC | PRN
  Administered 2019-07-19 (×2): 25 ug via INTRAVENOUS
  Administered 2019-07-19: 50 ug via INTRAVENOUS
  Administered 2019-07-19 (×2): 25 ug via INTRAVENOUS

## 2019-07-19 MED ORDER — SODIUM CHLORIDE 0.9 % IV SOLN: 250.0000 mL | INTRAVENOUS | Status: DC | PRN

## 2019-07-19 MED ORDER — ACETAMINOPHEN 325 MG PO TABS
650.0000 mg | ORAL_TABLET | ORAL | Status: DC | PRN
  Administered 2019-07-19: 16:00:00 650 mg via ORAL
  Filled 2019-07-19: qty 2

## 2019-07-19 MED ORDER — ADENOSINE (DIAGNOSTIC) FOR INTRACORONARY USE
INTRAVENOUS | Status: DC | PRN
  Administered 2019-07-19 (×2): 30 ug via INTRACORONARY
  Administered 2019-07-19: 60 ug via INTRACORONARY

## 2019-07-19 MED ORDER — ASPIRIN 81 MG PO CHEW
81.0000 mg | CHEWABLE_TABLET | Freq: Every day | ORAL | Status: DC
  Administered 2019-07-20: 09:00:00 81 mg via ORAL
  Filled 2019-07-19: qty 1

## 2019-07-19 MED ORDER — SODIUM CHLORIDE 0.9% FLUSH: 3.0000 mL | INTRAVENOUS | Status: DC | PRN

## 2019-07-19 MED ORDER — ANGIOPLASTY BOOK
Freq: Once | Status: AC
  Filled 2019-07-19: qty 1

## 2019-07-19 MED ORDER — HEPARIN (PORCINE) IN NACL 1000-0.9 UT/500ML-% IV SOLN
INTRAVENOUS | Status: AC
  Filled 2019-07-19: qty 1000

## 2019-07-19 MED ORDER — FENTANYL CITRATE (PF) 100 MCG/2ML IJ SOLN
INTRAMUSCULAR | Status: AC
  Filled 2019-07-19: qty 2

## 2019-07-19 MED ORDER — MIDAZOLAM HCL 2 MG/2ML IJ SOLN
INTRAMUSCULAR | Status: DC | PRN
  Administered 2019-07-19: 2 mg via INTRAVENOUS
  Administered 2019-07-19 (×3): 1 mg via INTRAVENOUS

## 2019-07-19 MED ORDER — LABETALOL HCL 5 MG/ML IV SOLN: 10.0000 mg | INTRAVENOUS | Status: AC | PRN

## 2019-07-19 MED ORDER — HEPARIN (PORCINE) IN NACL 1000-0.9 UT/500ML-% IV SOLN
INTRAVENOUS | Status: DC | PRN
  Administered 2019-07-19 (×3): 500 mL

## 2019-07-19 MED ORDER — SODIUM CHLORIDE 0.9% FLUSH: 3.0000 mL | Freq: Two times a day (BID) | INTRAVENOUS | Status: DC

## 2019-07-19 MED ORDER — TICAGRELOR 90 MG PO TABS
ORAL_TABLET | ORAL | Status: AC
  Filled 2019-07-19: qty 2

## 2019-07-19 MED ORDER — HEPARIN SODIUM (PORCINE) 1000 UNIT/ML IJ SOLN
INTRAMUSCULAR | Status: DC | PRN
  Administered 2019-07-19: 8000 [IU] via INTRAVENOUS
  Administered 2019-07-19 (×2): 5000 [IU] via INTRAVENOUS
  Administered 2019-07-19 (×2): 3000 [IU] via INTRAVENOUS

## 2019-07-19 MED ORDER — TICAGRELOR 90 MG PO TABS
90.0000 mg | ORAL_TABLET | Freq: Two times a day (BID) | ORAL | Status: DC
  Administered 2019-07-19 – 2019-07-20 (×2): 90 mg via ORAL
  Filled 2019-07-19 (×2): qty 1

## 2019-07-19 MED ORDER — VERAPAMIL HCL 2.5 MG/ML IV SOLN
INTRAVENOUS | Status: AC
  Filled 2019-07-19: qty 2

## 2019-07-19 MED ORDER — NITROGLYCERIN 1 MG/10 ML FOR IR/CATH LAB
INTRA_ARTERIAL | Status: AC
  Filled 2019-07-19: qty 10

## 2019-07-19 MED ORDER — SODIUM CHLORIDE 0.9 % IV SOLN: INTRAVENOUS | Status: AC

## 2019-07-19 MED ORDER — LIDOCAINE HCL (PF) 1 % IJ SOLN
INTRAMUSCULAR | Status: DC | PRN
  Administered 2019-07-19: 2 mL
  Administered 2019-07-19: 15 mL

## 2019-07-19 MED ORDER — SODIUM CHLORIDE 0.9% FLUSH
3.0000 mL | Freq: Two times a day (BID) | INTRAVENOUS | Status: DC
  Administered 2019-07-20: 3 mL via INTRAVENOUS

## 2019-07-19 MED ORDER — HYDRALAZINE HCL 20 MG/ML IJ SOLN: 10.0000 mg | INTRAMUSCULAR | Status: AC | PRN

## 2019-07-19 MED ORDER — LIDOCAINE HCL (PF) 1 % IJ SOLN
INTRAMUSCULAR | Status: AC
  Filled 2019-07-19: qty 30

## 2019-07-19 MED ORDER — ONDANSETRON HCL 4 MG/2ML IJ SOLN: 4.0000 mg | Freq: Four times a day (QID) | INTRAMUSCULAR | Status: DC | PRN

## 2019-07-19 MED ORDER — TICAGRELOR 90 MG PO TABS
ORAL_TABLET | ORAL | Status: DC | PRN
  Administered 2019-07-19: 180 mg via ORAL

## 2019-07-19 MED ORDER — ATORVASTATIN CALCIUM 80 MG PO TABS
80.0000 mg | ORAL_TABLET | Freq: Every day | ORAL | Status: DC
  Administered 2019-07-19: 80 mg via ORAL
  Filled 2019-07-19: qty 1

## 2019-07-19 MED ORDER — HEART ATTACK BOUNCING BOOK
Freq: Once | Status: AC
  Filled 2019-07-19: qty 1

## 2019-07-19 SURGICAL SUPPLY — 33 items
BALLN SAPPHIRE 2.0X12 (BALLOONS) ×2
BALLN SAPPHIRE 2.5X20 (BALLOONS) ×2
BALLN SAPPHIRE ~~LOC~~ 2.25X12 (BALLOONS) ×1 IMPLANT
BALLN SAPPHIRE ~~LOC~~ 3.5X15 (BALLOONS) ×1 IMPLANT
BALLN ~~LOC~~ EUPHORA RX 3.5X8 (BALLOONS) ×2
BALLOON SAPPHIRE 2.0X12 (BALLOONS) IMPLANT
BALLOON SAPPHIRE 2.5X20 (BALLOONS) IMPLANT
BALLOON ~~LOC~~ EUPHORA RX 3.5X8 (BALLOONS) IMPLANT
CATH 5FR JL3.5 JR4 ANG PIG MP (CATHETERS) ×1 IMPLANT
CATH EXTRAC PRONTO 5.5F 138CM (CATHETERS) ×1 IMPLANT
CATH INFINITI 5FR AL1 (CATHETERS) ×1 IMPLANT
CATH LAUNCHER 6FR AR1 (CATHETERS) ×1 IMPLANT
CATH LAUNCHER 6FR EBU 3.75 (CATHETERS) ×1 IMPLANT
CATHETER LAUNCHER 6FR MP1 (CATHETERS) ×1 IMPLANT
DEVICE RAD COMP TR BAND LRG (VASCULAR PRODUCTS) ×1 IMPLANT
GLIDESHEATH SLEND SS 6F .021 (SHEATH) ×1 IMPLANT
GUIDEWIRE INQWIRE 1.5J.035X260 (WIRE) IMPLANT
GUIDEWIRE PRESSURE COMET II (WIRE) ×1 IMPLANT
INQWIRE 1.5J .035X260CM (WIRE) ×2
KIT ENCORE 26 ADVANTAGE (KITS) ×1 IMPLANT
KIT HEART LEFT (KITS) ×2 IMPLANT
KIT HEMO VALVE WATCHDOG (MISCELLANEOUS) ×1 IMPLANT
PACK CARDIAC CATHETERIZATION (CUSTOM PROCEDURE TRAY) ×2 IMPLANT
SHEATH PINNACLE 6F 10CM (SHEATH) ×1 IMPLANT
SHEATH PROBE COVER 6X72 (BAG) ×1 IMPLANT
STENT SYNERGY DES 2.25X20 (Permanent Stent) ×1 IMPLANT
STENT SYNERGY DES 3X16 (Permanent Stent) ×1 IMPLANT
STENT SYNERGY DES 3X28 (Permanent Stent) ×1 IMPLANT
TRANSDUCER W/STOPCOCK (MISCELLANEOUS) ×2 IMPLANT
TUBING CIL FLEX 10 FLL-RA (TUBING) ×2 IMPLANT
WIRE ASAHI PROWATER 180CM (WIRE) ×1 IMPLANT
WIRE EMERALD 3MM-J .035X150CM (WIRE) ×1 IMPLANT
WIRE HI TORQ BMW 190CM (WIRE) ×1 IMPLANT

## 2019-07-19 NOTE — Progress Notes (Signed)
  Echocardiogram 2D Echocardiogram has been performed.  Clemens Lachman G Lennox Leikam 07/19/2019, 10:20 AM

## 2019-07-19 NOTE — H&P (View-Only) (Signed)
The patient has been seen in conjunction with Reino Bellis, NP. All aspects of care have been considered and discussed. The patient has been personally interviewed, examined, and all clinical data has been reviewed.   Non-ST elevation MI.  Cath this morning to define anatomy and help guide therapy.  The patient was counseled to undergo left heart catheterization, coronary angiography, and possible percutaneous coronary intervention with stent implantation. The procedural risks and benefits were discussed in detail. The risks discussed included death, stroke, myocardial infarction, life-threatening bleeding, limb ischemia, kidney injury, allergy, and possible emergency cardiac surgery. The risk of these significant complications were estimated to occur less than 1% of the time. After discussion, the patient has agreed to proceed.  Statin therapy for LDL less than 70.  Increase atorvastatin to 80 mg daily.    Progress Note  Patient Name: Randall Lopez Date of Encounter: 07/19/2019  Primary Cardiologist: No primary care provider on file.   Subjective   No chest pain overnight. HsT rising.   Inpatient Medications    Scheduled Meds: . aspirin EC  81 mg Oral Daily  . atenolol  25 mg Oral Daily  . loratadine  10 mg Oral Daily  . Melatonin  9 mg Oral QHS  . nitroGLYCERIN  1 inch Topical Q6H  . pantoprazole  40 mg Oral Daily  . potassium citrate  10 mEq Oral Daily  . sodium chloride flush  3 mL Intravenous Once  . sodium chloride flush  3 mL Intravenous Q12H  . trimethoprim-polymyxin b  1 drop Both Eyes Q4H   Continuous Infusions: . sodium chloride 10 mL/hr at 07/19/19 0301  . sodium chloride    . sodium chloride     Followed by  . sodium chloride    . heparin 1,200 Units/hr (07/18/19 1819)   PRN Meds: sodium chloride, acetaminophen, ALPRAZolam, nitroGLYCERIN, ondansetron (ZOFRAN) IV, sodium chloride flush, zolpidem   Vital Signs    Vitals:   07/18/19 1815 07/18/19  1842 07/18/19 2158 07/19/19 0555  BP: 139/74 (!) 154/72 116/62 124/65  Pulse: 72 79 69 74  Resp: (!) 26   18  Temp:  98.6 F (37 C) 98.1 F (36.7 C) 98 F (36.7 C)  TempSrc:  Oral Oral Oral  SpO2: 98% 99% 96% 96%  Weight:    96.5 kg  Height:        Intake/Output Summary (Last 24 hours) at 07/19/2019 0812 Last data filed at 07/19/2019 [id number] Gross per 24 hour  Intake 392.12 ml  Output 825 ml  Net -432.88 ml   Last 3 Weights 07/19/2019 07/18/2019 07/18/2019  Weight (lbs) 212 lb 11.2 oz 205 lb 205 lb  Weight (kg) 96.48 kg 92.987 kg 92.987 kg      Telemetry    SR - Personally Reviewed  ECG    SR - Personally Reviewed  Physical Exam  Pleasant older WM, sitting up in bed. GEN: No acute distress.   Neck: No JVD Cardiac: RRR, no murmurs, rubs, or gallops.  Respiratory: Clear to auscultation bilaterally. GI: Soft, nontender, non-distended  MS: No edema; No deformity. Neuro:  Nonfocal  Psych: Normal affect   Labs    High Sensitivity Troponin:   Recent Labs  Lab 07/18/19 1434 07/18/19 1633  TROPONINIHS 233* 1,611*      Chemistry Recent Labs  Lab 07/18/19 1434 07/18/19 1900 07/19/19 0256  NA 137  --  142  K 4.0  --  3.9  CL 101  --  107  CO2 24  --  24  GLUCOSE 117*  --  106*  BUN 20  --  20  CREATININE 1.19  --  1.20  CALCIUM 9.2  --  9.0  PROT  --  6.9  --   ALBUMIN  --  3.9  --   AST  --  44*  --   ALT  --  30  --   ALKPHOS  --  51  --   BILITOT  --  0.6  --   GFRNONAA >60  --  60*  GFRAA >60  --  >60  ANIONGAP 12  --  11     Hematology Recent Labs  Lab 07/18/19 1434 07/19/19 0256  WBC 8.8 9.6  RBC 5.47 5.05  HGB 16.6 15.3  HCT 48.5 43.8  MCV 88.7 86.7  MCH 30.3 30.3  MCHC 34.2 34.9  RDW 12.0 12.2  PLT 263 231    BNPNo results for input(s): BNP, PROBNP in the last 168 hours.   DDimer No results for input(s): DDIMER in the last 168 hours.   Radiology    DG Chest 2 View  Result Date: 07/18/2019 CLINICAL DATA:  Chest pain  EXAM: CHEST - 2 VIEW COMPARISON:  Dec 16, 2015 FINDINGS: The heart size and mediastinal contours are within normal limits. Both lungs are clear. The visualized skeletal structures are unremarkable. IMPRESSION: No active cardiopulmonary disease. Electronically Signed   By: Dorise Bullion III M.D   On: 07/18/2019 14:57    Cardiac Studies   N/a   Patient Profile     73 y.o. male hx of GERD, gout, HTN, and tachycardia now presents with chest pain and found to have a NSTEMI.  Assessment & Plan    1. NSTEMI: HsT 233>>1611. No chest pain overnight. Given rise in troponin now more concerning for ACS. Discussed cath with patient and he is agreeable to proceed.  -- The patient understands that risks included but are not limited to stroke (1 in 1000), death (1 in 2), kidney failure [usually temporary] (1 in 500), bleeding (1 in 200), allergic reaction [possibly serious] (1 in 200).  -- remains on IV heparin, ASA, BB. Statin added this morning  2. HTN: stable with current therapy  3. Hyperlipidemia: goal LDL <70.  -- added statin this morning.   For questions or updates, please contact [place] Please consult [email or web address] for contact info under   Signed, Reino Bellis, NP  07/19/2019, 8:12 AM

## 2019-07-19 NOTE — Interval H&P Note (Signed)
Cath Lab Visit (complete for each Cath Lab visit)  Clinical Evaluation Leading to the Procedure:   ACS: Yes.    Non-ACS:    Anginal Classification: CCS IV  Anti-ischemic medical therapy: Minimal Therapy (1 class of medications)  Non-Invasive Test Results: No non-invasive testing performed  Prior CABG: No previous CABG      History and Physical Interval Note:  07/19/2019 10:34 AM  Randall Lopez  has presented today for surgery, with the diagnosis of nonstemi.  The various methods of treatment have been discussed with the patient and family. After consideration of risks, benefits and other options for treatment, the patient has consented to  Procedure(s): LEFT HEART CATH AND CORONARY ANGIOGRAPHY (N/A) as a surgical intervention.  The patient's history has been reviewed, patient examined, no change in status, stable for surgery.  I have reviewed the patient's chart and labs.  Questions were answered to the patient's satisfaction.     Larae Grooms

## 2019-07-19 NOTE — Progress Notes (Addendum)
The patient has been seen in conjunction with Reino Bellis, NP. All aspects of care have been considered and discussed. The patient has been personally interviewed, examined, and all clinical data has been reviewed.   Non-ST elevation MI.  Cath this morning to define anatomy and help guide therapy.  The patient was counseled to undergo left heart catheterization, coronary angiography, and possible percutaneous coronary intervention with stent implantation. The procedural risks and benefits were discussed in detail. The risks discussed included death, stroke, myocardial infarction, life-threatening bleeding, limb ischemia, kidney injury, allergy, and possible emergency cardiac surgery. The risk of these significant complications were estimated to occur less than 1% of the time. After discussion, the patient has agreed to proceed.  Statin therapy for LDL less than 70.  Increase atorvastatin to 80 mg daily.    Progress Note  Patient Name: Randall Lopez Date of Encounter: 07/19/2019  Primary Cardiologist: No primary care provider on file.   Subjective   No chest pain overnight. HsT rising.   Inpatient Medications    Scheduled Meds: . aspirin EC  81 mg Oral Daily  . atenolol  25 mg Oral Daily  . loratadine  10 mg Oral Daily  . Melatonin  9 mg Oral QHS  . nitroGLYCERIN  1 inch Topical Q6H  . pantoprazole  40 mg Oral Daily  . potassium citrate  10 mEq Oral Daily  . sodium chloride flush  3 mL Intravenous Once  . sodium chloride flush  3 mL Intravenous Q12H  . trimethoprim-polymyxin b  1 drop Both Eyes Q4H   Continuous Infusions: . sodium chloride 10 mL/hr at 07/19/19 0301  . sodium chloride    . sodium chloride     Followed by  . sodium chloride    . heparin 1,200 Units/hr (07/18/19 1819)   PRN Meds: sodium chloride, acetaminophen, ALPRAZolam, nitroGLYCERIN, ondansetron (ZOFRAN) IV, sodium chloride flush, zolpidem   Vital Signs    Vitals:   07/18/19 1815 07/18/19  1842 07/18/19 2158 07/19/19 0555  BP: 139/74 (!) 154/72 116/62 124/65  Pulse: 72 79 69 74  Resp: (!) 26   18  Temp:  98.6 F (37 C) 98.1 F (36.7 C) 98 F (36.7 C)  TempSrc:  Oral Oral Oral  SpO2: 98% 99% 96% 96%  Weight:    96.5 kg  Height:        Intake/Output Summary (Last 24 hours) at 07/19/2019 0812 Last data filed at 07/19/2019 [id number] Gross per 24 hour  Intake 392.12 ml  Output 825 ml  Net -432.88 ml   Last 3 Weights 07/19/2019 07/18/2019 07/18/2019  Weight (lbs) 212 lb 11.2 oz 205 lb 205 lb  Weight (kg) 96.48 kg 92.987 kg 92.987 kg      Telemetry    SR - Personally Reviewed  ECG    SR - Personally Reviewed  Physical Exam  Pleasant older WM, sitting up in bed. GEN: No acute distress.   Neck: No JVD Cardiac: RRR, no murmurs, rubs, or gallops.  Respiratory: Clear to auscultation bilaterally. GI: Soft, nontender, non-distended  MS: No edema; No deformity. Neuro:  Nonfocal  Psych: Normal affect   Labs    High Sensitivity Troponin:   Recent Labs  Lab 07/18/19 1434 07/18/19 1633  TROPONINIHS 233* 1,611*      Chemistry Recent Labs  Lab 07/18/19 1434 07/18/19 1900 07/19/19 0256  NA 137  --  142  K 4.0  --  3.9  CL 101  --  107  CO2 24  --  24  GLUCOSE 117*  --  106*  BUN 20  --  20  CREATININE 1.19  --  1.20  CALCIUM 9.2  --  9.0  PROT  --  6.9  --   ALBUMIN  --  3.9  --   AST  --  44*  --   ALT  --  30  --   ALKPHOS  --  51  --   BILITOT  --  0.6  --   GFRNONAA >60  --  60*  GFRAA >60  --  >60  ANIONGAP 12  --  11     Hematology Recent Labs  Lab 07/18/19 1434 07/19/19 0256  WBC 8.8 9.6  RBC 5.47 5.05  HGB 16.6 15.3  HCT 48.5 43.8  MCV 88.7 86.7  MCH 30.3 30.3  MCHC 34.2 34.9  RDW 12.0 12.2  PLT 263 231    BNPNo results for input(s): BNP, PROBNP in the last 168 hours.   DDimer No results for input(s): DDIMER in the last 168 hours.   Radiology    DG Chest 2 View  Result Date: 07/18/2019 CLINICAL DATA:  Chest pain  EXAM: CHEST - 2 VIEW COMPARISON:  Dec 16, 2015 FINDINGS: The heart size and mediastinal contours are within normal limits. Both lungs are clear. The visualized skeletal structures are unremarkable. IMPRESSION: No active cardiopulmonary disease. Electronically Signed   By: Dorise Bullion III M.D   On: 07/18/2019 14:57    Cardiac Studies   N/a   Patient Profile     73 y.o. male hx of GERD, gout, HTN, and tachycardia now presents with chest pain and found to have a NSTEMI.  Assessment & Plan    1. NSTEMI: HsT 233>>1611. No chest pain overnight. Given rise in troponin now more concerning for ACS. Discussed cath with patient and he is agreeable to proceed.  -- The patient understands that risks included but are not limited to stroke (1 in 1000), death (1 in 57), kidney failure [usually temporary] (1 in 500), bleeding (1 in 200), allergic reaction [possibly serious] (1 in 200).  -- remains on IV heparin, ASA, BB. Statin added this morning  2. HTN: stable with current therapy  3. Hyperlipidemia: goal LDL <70.  -- added statin this morning.   For questions or updates, please contact [place] Please consult [email or web address] for contact info under   Signed, Reino Bellis, NP  07/19/2019, 8:12 AM

## 2019-07-19 NOTE — Progress Notes (Signed)
[facility] for Heparin Indication: chest pain/ACS  Heparin Dosing Weight: 89.8 kg  Labs: Recent Labs    07/18/19 1434 07/18/19 1633 07/19/19 0256  HGB 16.6  --  15.3  HCT 48.5  --  43.8  PLT 263  --  231  LABPROT  --   --  13.9  INR  --   --  1.1  HEPARINUNFRC  --   --  0.31  CREATININE 1.19  --  1.20  TROPONINIHS 233* 1,611*  --     Estimated Creatinine Clearance: 61.7 mL/min (by C-G formula based on SCr of 1.2 mg/dL).  Assessment: 65 yom presenting with CP, elevated high-sensitivity troponin. Pharmacy consulted to dose heparin. Patient is not on anticoagulation PTA. CBC wnl. No active bleed issues documented  12/17 AM update:  Heparin level therapeutic this AM  Goal of Therapy:  Heparin level 0.3-0.7 units/ml Monitor platelets by anticoagulation protocol: Yes   Plan:  Cont heparin at 1200 units/hr Confirmatory heparin level in 6-8 hours   Narda Bonds, PharmD, [place] Pharmacist Phone: [phone]

## 2019-07-20 ENCOUNTER — Inpatient Hospital Stay (HOSPITAL_COMMUNITY): Payer: PPO

## 2019-07-20 DIAGNOSIS — Z955 Presence of coronary angioplasty implant and graft: Secondary | ICD-10-CM

## 2019-07-20 LAB — BASIC METABOLIC PANEL
Anion gap: 10 (ref 5–15)
BUN: 13 mg/dL (ref 8–23)
CO2: 23 mmol/L (ref 22–32)
Calcium: 8.6 mg/dL — ABNORMAL LOW (ref 8.9–10.3)
Chloride: 106 mmol/L (ref 98–111)
Creatinine, Ser: 1.04 mg/dL (ref 0.61–1.24)
GFR calc Af Amer: >60 mL/min (ref >60)
GFR calc non Af Amer: >60 mL/min (ref >60)
Glucose, Bld: 114 mg/dL — ABNORMAL HIGH (ref 70–99)
Potassium: 3.6 mmol/L (ref 3.5–5.1)
Sodium: 139 mmol/L (ref 135–145)

## 2019-07-20 LAB — CBC
HCT: 45.3 % (ref 39.0–52.0)
Hemoglobin: 15.9 g/dL (ref 13.0–17.0)
MCH: 30.3 pg (ref 26.0–34.0)
MCHC: 35.1 g/dL (ref 30.0–36.0)
MCV: 86.5 fL (ref 80.0–100.0)
Platelets: 224 10*3/uL (ref 150–400)
RBC: 5.24 MIL/uL (ref 4.22–5.81)
RDW: 12.2 % (ref 11.5–15.5)
WBC: 10.2 10*3/uL (ref 4.0–10.5)
nRBC: 0 % (ref 0.0–0.2)

## 2019-07-20 MED ORDER — CARVEDILOL 12.5 MG PO TABS: 12.5000 mg | ORAL_TABLET | Freq: Two times a day (BID) | ORAL | 3 refills | Status: DC

## 2019-07-20 MED ORDER — ATORVASTATIN CALCIUM 80 MG PO TABS: 80.0000 mg | ORAL_TABLET | Freq: Every day | ORAL | 11 refills | Status: DC

## 2019-07-20 MED ORDER — CARVEDILOL 12.5 MG PO TABS
12.5000 mg | ORAL_TABLET | Freq: Two times a day (BID) | ORAL | Status: DC
  Administered 2019-07-20: 12.5 mg via ORAL

## 2019-07-20 MED ORDER — FUROSEMIDE 20 MG PO TABS
20.0000 mg | ORAL_TABLET | Freq: Once | ORAL | Status: AC
  Administered 2019-07-20: 20 mg via ORAL
  Filled 2019-07-20: qty 1

## 2019-07-20 MED ORDER — ASPIRIN 81 MG PO CHEW: 81.0000 mg | CHEWABLE_TABLET | Freq: Every day | ORAL | 11 refills | Status: DC

## 2019-07-20 MED ORDER — TICAGRELOR 90 MG PO TABS: 90.0000 mg | ORAL_TABLET | Freq: Two times a day (BID) | ORAL | 11 refills | Status: DC

## 2019-07-20 MED ORDER — NITROGLYCERIN 0.4 MG SL SUBL: 0.4000 mg | SUBLINGUAL_TABLET | SUBLINGUAL | 3 refills | Status: DC | PRN

## 2019-07-20 MED FILL — ASPIRIN LOW DOSE 81 MG CHEW: 81 | 30 days supply | Qty: 30 | Fill #0

## 2019-07-20 MED FILL — BRILINTA 90 MG TABLET: 90 | 30 days supply | Qty: 60 | Fill #0

## 2019-07-20 MED FILL — ATORVASTATIN CALCIUM 80 MG: 80 | 30 days supply | Qty: 30 | Fill #0

## 2019-07-20 MED FILL — CARVEDILOL 12.5 MG TABLET: 12.5 | 30 days supply | Qty: 60 | Fill #0

## 2019-07-20 MED FILL — NITROGLYCERIN 0.4 MG TAB SL: 0.4 | 8 days supply | Qty: 25 | Fill #0

## 2019-07-20 NOTE — Discharge Instructions (Signed)
PLEASE REMEMBER TO BRING ALL OF YOUR MEDICATIONS TO EACH OF YOUR FOLLOW-UP OFFICE VISITS.  PLEASE ATTEND ALL SCHEDULED FOLLOW-UP APPOINTMENTS.   Activity: Increase activity slowly as tolerated. You may shower, but no soaking baths (or swimming) for 1 week. No driving for 24 hours. No lifting over 5 lbs for 1 week. No sexual activity for 1 week.   You May Return to Work: in 1 week (if applicable)  Wound Care: You may wash cath site gently with soap and water. Keep cath site clean and dry. If you notice pain, swelling, bleeding or pus at your cath site, please call [phone].   Shortness of breath is a common side effect associated with Brilinta (ticagrelor).  Taking this medication with a caffeinated  beverage can help. The shortness of breath should get better over the next several days.    Avoid taking NSAID medications (ibuprofen, motrin, naproxen, naprosyn, Excedrin, BC powder, ect.) now that you will be taking aspirin and brilinta. These medications can increase your bleeding risk if taken regularly

## 2019-07-20 NOTE — Significant Event (Signed)
Notified by Ardith Dark. that patient complaining of increasing SOB. Not hypoxic on RA but placed on 2L O2 for comfort. Other vitals stable. CXR ordered and personally reviewed - demonstrates some increasing bibasilar non-specific opacities likely atelectasis and mild pulmonary vascular congestion. Asked RN to place incentive spirometer at bedside and do teaching. 20mg  PO lasix x 1 ordered.   Kentaro Alewine K. Marletta Lor, MD

## 2019-07-20 NOTE — Care Management (Signed)
Per Clifton James W/ [facility] Ref.# [id number]. Co-pay amount for Brilinta 90 mg.bid for 30 day supply $45.00.  No PA required No deductible for this plan Tier 3 medication. Retail pharmacies: [facility].

## 2019-07-20 NOTE — Discharge Summary (Addendum)
The patient has been seen in conjunction with Roby Lofts, PA-C. All aspects of care have been considered and discussed. The patient has been personally interviewed, examined, and all clinical data has been reviewed.  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 RCA single stent, diagonal stent, and crossover stent in LAD. Hyperpnea last evening related to Brilinta.  Has not recurred this morning after receiving a.m. dose approximately 90 minutes ago. He has been ambulated by cardiac rehab.  He has not had recurrent angina.  Appropriate education is encouraged. We will continue Brilinta for at least 1 month before considering switching to Plavix.  If symptoms are excessive, may have to do it prior to then but with so many stents, want to avoid the possibility of exposure to stent thrombosis given 10 to 20% risk of nonresponder on Plavix. Access sites are without evidence of bleeding with intact pulses. Current medical regimen is adequate: High intensity statin; beta-blocker therapy; dual antiplatelet therapy; and if additional blood pressure control needed, would add low-dose ARB as OP Follow-up with Dane[facility] Schick team 1 to 2 weeks.  Earlier if symptoms.   Discharge Summary    Patient ID: Randall Lopez MRN: [id number]; DOB: 11-09-1945  Admit date: 07/18/2019 Discharge date: 07/20/2019  Primary Care Provider: Mayra Neer, MD  Primary Cardiologist: Sinclair Grooms, MD  Primary Electrophysiologist:  None   Discharge Diagnoses    Active Problems:   NSTEMI (non-ST elevated myocardial infarction) [facility])   Chest pain   Status post coronary artery stent placement    Diagnostic Studies/Procedures    Echocardiogram 07/19/2019: 1. Left ventricular ejection fraction, by visual estimation, is 65 to 70%. The left ventricle has normal function. There is mildly increased left ventricular hypertrophy.  2. Left  ventricular diastolic parameters are consistent with Grade I diastolic dysfunction (impaired relaxation).  3. The left ventricle has no regional wall motion abnormalities.  4. Global right ventricle has normal systolic function.The right ventricular size is normal. No increase in right ventricular wall thickness.  5. Left atrial size was normal.  6. Right atrial size was normal.  7. The mitral valve is grossly normal. Trivial mitral valve regurgitation.  8. The tricuspid valve is grossly normal. Tricuspid valve regurgitation is trivial.  9. The aortic valve is tricuspid. Aortic valve regurgitation is mild. Mild aortic valve sclerosis without stenosis. 10. The pulmonic valve was grossly normal. Pulmonic valve regurgitation is not visualized.  Left heart catheterization 07/19/2019: Mid RCA lesion is 99% stenosed. This as the culprit lesion. A drug-eluting stent was successfully placed using a STENT SYNERGY DES 3X16. Post intervention, there is a 0% residual stenosis. 1st Diag lesion is 90% stenosed. A drug-eluting stent was successfully placed using a STENT SYNERGY DES 2.25X20. Post intervention, there is a 0% residual stenosis. Prox LAD to Mid LAD lesion is 60% stenosed. DFR showed this lesion to be hemodynamically significant. A drug-eluting stent was successfully placed using a STENT SYNERGY DES 3X28, postdilated to 3.5 mm. TIMI 3 flow remained in the jailed diagonal. Post intervention, there is a 0% residual stenosis. The left ventricular systolic function is normal. LV end diastolic pressure is normal. The left ventricular ejection fraction is 55-65% by visual estimate. There is no aortic valve stenosis.   Continue aggressive secondary prevention and RF modification along with DAPT.   Results communicated to wife.   _____________   History of Present Illness     73 y.o. male  hx of GERD, gout, HTN, and tachycardia who presents with chest pain, no prior cardiac studies except monitor  10 years or more ago.  This was for palpitations which were PACs.  No recent chest pain or palpitations.   Yesterday was feeling well, fairly active. Today he was doing normal activities - had hair cut then he had nausea then dome epigastric discomfort then up into chest  Centrally and into back.  He took 2 Ibuprofen.  He has stomach issues which he thought this was.   Until it went into chest.  Once in ER- driven here by wife, pain began subsiding.  He was given ASA here 324 mg.     Currently no angina.  Has not had fever, but has had sinus congestion recently.  On zyrtec.     A couple of days ago did have 15 sec of double vision, but no further episodes.     First Troponin  233 Na 137, K+ 4.0, Glucose 117, Cr 1.19  WBC 8.8 hgb 16.6 plts 263      CXR 2 V, no active cardio pulmonary disease   EKG:  The ECG that was done SR with early repol in II but similar to EKG in 01/2019 was personally reviewed  Hospital Course     Consultants: None   1. NSTEMI: patient presented with chest pain. HsTrop peaked at 1611. EKG without ischemic changes. Decision made to pursue LHC which showed severe mRCA and 1st diagonal stenosis managed with PCI/DES, also with moderate p-mLAD stenosis which was hemodynamically significant managed with PCI/DES. He was recommended for DAPT with aspirin and brilinta, and recommended for aggressive RF modifications. He reported some SOB following his heart catheterization. CXR with developing infiltrate vs atelectasis. He was giving po lasix 20mg  with improvement in symptoms. Likely SOB contributed to by brilinta.  - Continue aspirin and brilinta - Continue atorvastatin - Transitioned from atenolol to carvedilol 12.5mg  BID for improved BP control  2. HTN: BP generally above goal this admission - Transitioned from atenolol to carvedilol 12.5mg  BID for improved BP control  3. HLD: LDL 89 this admission. Started on high-intensity statin for RF modification - Continue atorvastatin  80mg  daily - Repeat FLP and LFTs in 6-8 weeks   4. GERD:  - Continue omeprazole   Did the patient have an acute coronary syndrome (MI, NSTEMI, STEMI, etc) this admission?:  Yes                               AHA/ACC Clinical Performance & Quality Measures: Aspirin prescribed? - Yes ADP Receptor Inhibitor (Plavix/Clopidogrel, Brilinta/Ticagrelor or Effient/Prasugrel) prescribed (includes medically managed patients)? - Yes Beta Blocker prescribed? - Yes High Intensity Statin (Lipitor 40-80mg  or Crestor 20-40mg ) prescribed? - Yes EF assessed during THIS hospitalization? - Yes For EF <40%, was ACEI/ARB prescribed? - Not Applicable (EF >/= [id number]) For EF <40%, Aldosterone Antagonist (Spironolactone or Eplerenone) prescribed? - Not Applicable (EF >/= [id number]) Cardiac Rehab Phase II ordered (Included Medically managed Patients)? - Yes   _____________  Discharge Vitals Blood pressure 134/72, pulse 78, temperature 97.8 F (36.6 C), temperature source Oral, resp. rate 18, height 5\' 9"  (1.753 m), weight 96.2 kg, SpO2 98 %.  Filed Weights   07/18/19 1800 07/19/19 0555 07/20/19 0353  Weight: 93 kg 96.5 kg 96.2 kg   Physical exam on the day of discharge:  GEN: Laying in bed in no acute distress.   Neck: No  JVD, no carotid bruits Cardiac: RRR, no murmurs, rubs, or gallops. Groin/radial sites are C/D/I, no hematoma or ecchymosis  Respiratory: Clear to auscultation bilaterally, no wheezes/ rales/ rhonchi GI: NABS, Soft, nontender, non-distended  MS: No edema; No deformity. Neuro:  Nonfocal, moving all extremities spontaneously Psych: Normal affect     Labs & Radiologic Studies    CBC Recent Labs    07/19/19 0256 07/20/19 0417  WBC 9.6 10.2  HGB 15.3 15.9  HCT 43.8 45.3  MCV 86.7 86.5  PLT 231 [id number]   Basic Metabolic Panel Recent Labs    07/18/19 1900 07/19/19 0256 07/20/19 0417  NA  --  142 139  K  --  3.9 3.6  CL  --  107 106  CO2  --  24 23  GLUCOSE  --  106* 114*  BUN  --  20  13  CREATININE  --  1.20 1.04  CALCIUM  --  9.0 8.6*  MG 1.9  --   --    Liver Function Tests Recent Labs    07/18/19 1900  AST 44*  ALT 30  ALKPHOS 51  BILITOT 0.6  PROT 6.9  ALBUMIN 3.9   No results for input(s): LIPASE, AMYLASE in the last 72 hours. High Sensitivity Troponin:   Recent Labs  Lab 07/18/19 1434 07/18/19 1633  TROPONINIHS 233* 1,611*    BNP Invalid input(s): POCBNP D-Dimer No results for input(s): DDIMER in the last 72 hours. Hemoglobin A1C Recent Labs    07/18/19 1900  HGBA1C 5.5   Fasting Lipid Panel Recent Labs    07/19/19 0256  CHOL 146  HDL 34*  LDLCALC 89  TRIG 114  CHOLHDL 4.3   Thyroid Function Tests Recent Labs    07/18/19 1900  TSH 3.317   _____________  DG Chest 2 View  Result Date: 07/18/2019 CLINICAL DATA:  Chest pain EXAM: CHEST - 2 VIEW COMPARISON:  Dec 16, 2015 FINDINGS: The heart size and mediastinal contours are within normal limits. Both lungs are clear. The visualized skeletal structures are unremarkable. IMPRESSION: No active cardiopulmonary disease. Electronically Signed   By: Dorise Bullion III M.D   On: 07/18/2019 14:57   CARDIAC CATHETERIZATION  Result Date: 07/19/2019  Mid RCA lesion is 99% stenosed. This as the culprit lesion.  A drug-eluting stent was successfully placed using a STENT SYNERGY DES 3X16.  Post intervention, there is a 0% residual stenosis.  1st Diag lesion is 90% stenosed.  A drug-eluting stent was successfully placed using a STENT SYNERGY DES 2.25X20.  Post intervention, there is a 0% residual stenosis.  Prox LAD to Mid LAD lesion is 60% stenosed. DFR showed this lesion to be hemodynamically significant.  A drug-eluting stent was successfully placed using a STENT SYNERGY DES 3X28, postdilated to 3.5 mm. TIMI 3 flow remained in the jailed diagonal.  Post intervention, there is a 0% residual stenosis.  The left ventricular systolic function is normal.  LV end diastolic pressure is normal.   The left ventricular ejection fraction is 55-65% by visual estimate.  There is no aortic valve stenosis.  Continue aggressive secondary prevention and RF modification along with DAPT. Results communicated to wife.    DG CHEST PORT 1 VIEW  Result Date: 07/20/2019 CLINICAL DATA:  Increasing shortness of breath. EXAM: PORTABLE CHEST 1 VIEW COMPARISON:  July 18, 2019 FINDINGS: The lung volumes are low. There are possible new streaky bibasilar airspace opacities. There is no pneumothorax. The interstitial lung markings are slightly prominent. The  trachea is shifted to the right with some thickening of the left paratracheal stripe. IMPRESSION: Low lung volumes with apparent new streaky bibasilar airspace opacities raising suspicion for a developing infiltrate or atelectasis. Electronically Signed   By: Constance Holster M.D.   On: 07/20/2019 02:16   ECHOCARDIOGRAM COMPLETE  Result Date: 07/19/2019   ECHOCARDIOGRAM REPORT   Patient Name:   Randall Lopez Date of Exam: 07/19/2019 Medical Rec #:  [id number]      Height:       69.0 in Accession #:    [id number]     Weight:       212.7 lb Date of Birth:  January 21, 1946     BSA:          2.12 m Patient Age:    33 years       BP:           124/65 mmHg Patient Gender: M              HR:           79 bpm. Exam Location:  Inpatient Procedure: 2D Echo, Cardiac Doppler and Color Doppler Indications:    R07.9* Chest pain, unspecified  History:        Patient has no prior history of Echocardiogram examinations.                 Risk Factors:Hypertension and GERD.  Sonographer:    Jonelle Sidle Dance Referring Phys: [place]  1. Left ventricular ejection fraction, by visual estimation, is 65 to 70%. The left ventricle has normal function. There is mildly increased left ventricular hypertrophy.  2. Left ventricular diastolic parameters are consistent with Grade I diastolic dysfunction (impaired relaxation).  3. The left ventricle has no regional wall motion  abnormalities.  4. Global right ventricle has normal systolic function.The right ventricular size is normal. No increase in right ventricular wall thickness.  5. Left atrial size was normal.  6. Right atrial size was normal.  7. The mitral valve is grossly normal. Trivial mitral valve regurgitation.  8. The tricuspid valve is grossly normal. Tricuspid valve regurgitation is trivial.  9. The aortic valve is tricuspid. Aortic valve regurgitation is mild. Mild aortic valve sclerosis without stenosis. 10. The pulmonic valve was grossly normal. Pulmonic valve regurgitation is not visualized. FINDINGS  Left Ventricle: Left ventricular ejection fraction, by visual estimation, is 65 to 70%. The left ventricle has normal function. The left ventricle has no regional wall motion abnormalities. There is mildly increased left ventricular hypertrophy. Left ventricular diastolic parameters are consistent with Grade I diastolic dysfunction (impaired relaxation). Indeterminate filling pressures. Right Ventricle: The right ventricular size is normal. No increase in right ventricular wall thickness. Global RV systolic function is has normal systolic function. Left Atrium: Left atrial size was normal in size. Right Atrium: Right atrial size was normal in size Pericardium: There is no evidence of pericardial effusion. Mitral Valve: The mitral valve is grossly normal. Trivial mitral valve regurgitation. Tricuspid Valve: The tricuspid valve is grossly normal. Tricuspid valve regurgitation is trivial. Aortic Valve: The aortic valve is tricuspid. Aortic valve regurgitation is mild. Aortic regurgitation PHT measures 436 msec. Mild aortic valve sclerosis is present, with no evidence of aortic valve stenosis. Pulmonic Valve: The pulmonic valve was grossly normal. Pulmonic valve regurgitation is not visualized. Pulmonic regurgitation is not visualized. Aorta: The aortic root and ascending aorta are structurally normal, with no evidence of  dilitation. Venous: The inferior vena cava  was not well visualized. IAS/Shunts: No atrial level shunt detected by color flow Doppler.  LEFT VENTRICLE PLAX 2D LVIDd:         4.72 cm  Diastology LVIDs:         2.67 cm  LV e' lateral:   6.24 cm/s LV PW:         1.16 cm  LV E/e' lateral: 11.5 LV IVS:        0.98 cm  LV e' medial:    6.89 cm/s LVOT diam:     2.10 cm  LV E/e' medial:  10.4 LV SV:         77 ml LV SV Index:   35.11 LVOT Area:     3.46 cm  RIGHT VENTRICLE             IVC RV Basal diam:  2.35 cm     IVC diam: 2.11 cm RV S prime:     12.40 cm/s TAPSE (M-mode): 1.7 cm LEFT ATRIUM             Index       RIGHT ATRIUM           Index LA diam:        3.50 cm 1.65 cm/m  RA Area:     11.70 cm LA Vol (A2C):   58.4 ml 27.54 ml/m RA Volume:   20.90 ml  9.85 ml/m LA Vol (A4C):   67.5 ml 31.83 ml/m LA Biplane Vol: 63.4 ml 29.89 ml/m  AORTIC VALVE LVOT Vmax:   99.80 cm/s LVOT Vmean:  68.900 cm/s LVOT VTI:    0.212 m AI PHT:      436 msec  AORTA Ao Root diam: 3.60 cm Ao Asc diam:  3.40 cm MITRAL VALVE MV Area (PHT): 4.31 cm             SHUNTS MV PHT:        51.04 msec           Systemic VTI:  0.21 m MV Decel Time: 176 msec             Systemic Diam: 2.10 cm MV E velocity: 71.60 cm/s 103 cm/s MV A velocity: 86.90 cm/s 70.3 cm/s MV E/A ratio:  0.82       1.5  Lyman Bishop MD Electronically signed by Lyman Bishop MD Signature Date/Time: 07/19/2019/10:59:29 AM    Final    Disposition   Pt is being discharged home today in good condition.  Follow-up Plans & Appointments    Follow-up Information     Burtis Junes, NP Follow up on 08/06/2019.   Specialties: Nurse Practitioner, Interventional Cardiology, Cardiology, Radiology Why: Please arrive 15 minutes early for your 10:00am post-hospital cardiology follow-up appointment Contact information: [place]. [address] [place] [address] [phone]           Discharge Instructions     Amb Referral to Cardiac Rehabilitation   Complete  by: As directed    Diagnosis:  NSTEMI Coronary Stents     After initial evaluation and assessments completed: Virtual Based Care may be provided alone or in conjunction with Phase 2 Cardiac Rehab based on patient barriers.: Yes       Discharge Medications   Allergies as of 07/20/2019   No Known Allergies      Medication List     STOP taking these medications    atenolol 25 MG tablet Commonly known as: TENORMIN  TAKE these medications    aspirin 81 MG chewable tablet Chew 1 tablet (81 mg total) by mouth daily. Start taking on: July 21, 2019   atorvastatin 80 MG tablet Commonly known as: LIPITOR Take 1 tablet (80 mg total) by mouth daily at 6 PM.   carvedilol 12.5 MG tablet Commonly known as: COREG Take 1 tablet (12.5 mg total) by mouth 2 (two) times daily with a meal.   cetirizine 10 MG tablet Commonly known as: ZYRTEC Take 10 mg by mouth at bedtime.   ibuprofen 200 MG tablet Commonly known as: ADVIL Take 400 mg by mouth every 6 (six) hours as needed for mild pain or moderate pain.   Melatonin 10 MG Tabs Take 1 tablet by mouth at bedtime.   nitroGLYCERIN 0.4 MG SL tablet Commonly known as: NITROSTAT Place 1 tablet (0.4 mg total) under the tongue every 5 (five) minutes x 3 doses as needed for chest pain.   omeprazole 20 MG capsule Commonly known as: PRILOSEC Take 20 mg by mouth every morning.   Potassium Citrate 15 MEQ (1620 MG) Tbcr Take 1 tablet by mouth daily.   ticagrelor 90 MG Tabs tablet Commonly known as: BRILINTA Take 1 tablet (90 mg total) by mouth 2 (two) times daily.   trimethoprim-polymyxin b ophthalmic solution Commonly known as: Polytrim Place 1 drop into both eyes every 4 (four) hours.           Outstanding Labs/Studies   Repeat FLP and LFTs in 6-8 weeks  Duration of Discharge Encounter   Greater than 30 minutes including physician time.  Signed, Abigail Butts, PA-C 07/20/2019, 11:16 AM

## 2019-07-20 NOTE — Progress Notes (Signed)
Pt.c/o SOB ;V/S taken B/P=150/70;HR=75;R=20;O2 sat=98% on RA;noted crackles on the bases . .MD on call Winfield Cunas was called & made aware ordered stat port.CXR & to give Lasix 20 mg IV .

## 2019-07-20 NOTE — Progress Notes (Signed)
CARDIAC REHAB PHASE I   PRE:  Rate/Rhythm: 81 SR  BP:  Supine: 134/72  Sitting:   Standing:    SaO2: 99% 2L   MODE:  Ambulation: 470 ft   POST:  Rate/Rhythm: 107 ST  BP:  Supine: 161/69  Sitting:   Standing:    SaO2: 98%RA 0915-1020 Pt walked 470 ft on RA with steady gait and no CP. Monitored Sats whole  walk and at 98%. Discussed with pt importance of brilinta, how to take NTG tablets, gave heart healthy diet, discussed CRP 2, and encouraged walking as tolerated with orthopedic issues. Tolerated walk well. Still a little SOB at times. Referred to [place] CRP 2. Pt not interested in Virtual ex APP.   Graylon Good, RN BSN  07/20/2019 10:17 AM

## 2019-07-20 NOTE — Plan of Care (Signed)
  Problem: Education: Goal: Knowledge of General Education information will improve Description: Including pain rating scale, medication(s)/side effects and non-pharmacologic comfort measures Outcome: Adequate for Discharge   Problem: Health Behavior/Discharge Planning: Goal: Ability to manage health-related needs will improve Outcome: Adequate for Discharge   Problem: Clinical Measurements: Goal: Ability to maintain clinical measurements within normal limits will improve Outcome: Adequate for Discharge Goal: Will remain free from infection Outcome: Adequate for Discharge Goal: Diagnostic test results will improve Outcome: Adequate for Discharge Goal: Respiratory complications will improve Outcome: Adequate for Discharge Goal: Cardiovascular complication will be avoided Outcome: Adequate for Discharge   Problem: Activity: Goal: Risk for activity intolerance will decrease Outcome: Adequate for Discharge   Problem: Nutrition: Goal: Adequate nutrition will be maintained Outcome: Adequate for Discharge   Problem: Coping: Goal: Level of anxiety will decrease Outcome: Adequate for Discharge   Problem: Elimination: Goal: Will not experience complications related to bowel motility Outcome: Adequate for Discharge Goal: Will not experience complications related to urinary retention Outcome: Adequate for Discharge   Problem: Pain Managment: Goal: General experience of comfort will improve Outcome: Adequate for Discharge   Problem: Safety: Goal: Ability to remain free from injury will improve Outcome: Adequate for Discharge   Problem: Skin Integrity: Goal: Risk for impaired skin integrity will decrease Outcome: Adequate for Discharge   Problem: Education: Goal: Understanding of cardiac disease, CV risk reduction, and recovery process will improve Outcome: Adequate for Discharge Goal: Understanding of medication regimen will improve Outcome: Adequate for Discharge Goal:  Individualized Educational Video(s) Outcome: Adequate for Discharge   Problem: Activity: Goal: Ability to tolerate increased activity will improve Outcome: Adequate for Discharge   Problem: Cardiac: Goal: Ability to achieve and maintain adequate cardiopulmonary perfusion will improve Outcome: Adequate for Discharge Goal: Vascular access site(s) Level 0-1 will be maintained Outcome: Adequate for Discharge   Problem: Health Behavior/Discharge Planning: Goal: Ability to safely manage health-related needs after discharge will improve Outcome: Adequate for Discharge

## 2019-07-20 NOTE — Progress Notes (Signed)
Dr.Lauren Georga Hacking made aware of CXR sults.Instructed to use IS  & he was able to do @ 1750 x 10 .Will cont/ to monitor pt.

## 2019-07-23 ENCOUNTER — Telehealth: Payer: Self-pay | Admitting: Interventional Cardiology

## 2019-07-23 NOTE — Telephone Encounter (Signed)
Patient is calling wanting to know if he needs to come in to get a new bandage put on his catheter incision due to his current one coming off. Please advise.

## 2019-07-23 NOTE — Telephone Encounter (Signed)
Spoke with pt and bandage is coming off.  Advised ok to go ahead and remove.  Pt appreciative for call.

## 2019-07-25 ENCOUNTER — Telehealth (HOSPITAL_COMMUNITY): Payer: Self-pay

## 2019-07-25 NOTE — Telephone Encounter (Signed)
Attempted to call patient in regards to Cardiac Rehab - LM on VM 

## 2019-08-03 NOTE — Progress Notes (Addendum)
CARDIOLOGY OFFICE NOTE  Date:  08/06/2019    Randall Lopez Date of Birth: 22-Oct-1945 Medical Record [id number]  PCP:  Mayra Neer, MD  Cardiologist:  Tamala Julian (NEW)    Chief Complaint  Patient presents with  . Hospitalization Follow-up    Seen for Dr. Tamala Julian (NEW)    History of Present Illness: Randall Lopez is a 74 y.o. male who presents today for a TOC visit. However he is outside the [facility] window.  Seen for Dr. Tamala Julian.   He has a history of GERD, gout, HTN and tachycardia. Remote monitor for palpitations showing PACs.   Presented last month with nausea, epigastric discomfort and then chest pain - troponin was elevated consistent with NSTEMI. He underwent LHC which showed severe mRCA and 1st DX stenosis treated with PCI/DES. Also with moderate p-mLAD disease - found to be hemodynamically significant and treated with PCI/DES. Placed on DAPT. Transitioned from atenolol to Coreg for better BP control. High dose statin also started.   The patient does not have symptoms concerning for COVID-19 infection (fever, chills, cough, or new shortness of breath).   Comes in today. Here alone. He feels like he is doing well. 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 His readings from home actually look pretty good. He admits he felt a little antsy coming in here today. Very few readings in the 140's - otherwise, he is at goal. Doing ok on the Brilinta - shortness of breath pretty much settled down and he is using some caffeine with good results - he feels this is very tolerable. No chest pain. Ordered a treadmill - does not wish to be out and about due to COVID. Would like refills for #90. No problems with his groin.   Past Medical History:  Diagnosis Date  . Cough    low grade fever  . Enlarged prostate   . GERD (gastroesophageal reflux disease)    takes Omeprazole daily  . Gout    hx of,not on any meds  .  History of kidney stones    x3 -pass 1, litho x2  . Hypertension    takes Atenolol daily  . Sinus infection    completed z pak 12/16/15.  . Tachycardia     Past Surgical History:  Procedure Laterality Date  . ANTERIOR CRUCIATE LIGAMENT REPAIR Left 12/23/2015   Procedure: LEFT KNEE DIAGNOSTIC OPERATIVE ARTHROSCOPY, DEBRIDEMENT, RECONSTRUCTION ANTERIOR CRUCIATE LIGAMENT (ACL) WITH HAMSTRING GRAFT;  Surgeon: Meredith Pel, MD;  Location: [place];  Service: Orthopedics;  Laterality: Left;  LEFT KNEE DIAGNOSTIC OPERATIVE ARHTROSCOPY, DEBRIDEMENT, ANTERIOR CRUCIATE LIGAMENT RECONSTRUCTION IWTH HAMSTRING AUTOGRAFT.  Marland Kitchen BACK SURGERY    . CATARACT EXTRACTION, BILATERAL     last 09-14-14  . CHOLECYSTECTOMY    . COLONOSCOPY W/ POLYPECTOMY     past hx. polyps-benign  . COLONOSCOPY WITH PROPOFOL N/A 10/14/2014   Procedure: COLONOSCOPY WITH PROPOFOL;  Surgeon: Garlan Fair, MD;  Location: [facility];  Service: Endoscopy;  Laterality: N/A;  . CORONARY STENT INTERVENTION N/A 07/19/2019   Procedure: CORONARY STENT INTERVENTION;  Surgeon: Jettie Booze, MD;  Location: [place] [facility];  Service: Cardiovascular;  Laterality: N/A;  . CORONARY THROMBECTOMY N/A 07/19/2019   Procedure: Coronary Thrombectomy;  Surgeon: Jettie Booze, MD;  Location: [place] [facility];  Service: Cardiovascular;  Laterality: N/A;  . INTRAVASCULAR PRESSURE WIRE/FFR STUDY N/A 07/19/2019  Procedure: INTRAVASCULAR PRESSURE WIRE/FFR STUDY;  Surgeon: Jettie Booze, MD;  Location: [place] [facility];  Service: Cardiovascular;  Laterality: N/A;  . LAPAROSCOPIC CHOLECYSTECTOMY SINGLE PORT  08/04/2012   Procedure: LAPAROSCOPIC CHOLECYSTECTOMY SINGLE PORT;  Surgeon: Adin Hector, MD;  Location: [facility];  Service: General;  Laterality: N/A;  LYSIS OF ADHESIONS with Intraoperative Cholangiogram   . LEFT HEART CATH AND CORONARY ANGIOGRAPHY N/A 07/19/2019   Procedure: LEFT HEART CATH AND CORONARY ANGIOGRAPHY;   Surgeon: Jettie Booze, MD;  Location: [place] [facility];  Service: Cardiovascular;  Laterality: N/A;  . LITHOTRIPSY    . LUMBAR LAMINECTOMY/DECOMPRESSION MICRODISCECTOMY  12/17/2011   Procedure: LUMBAR LAMINECTOMY/DECOMPRESSION MICRODISCECTOMY;  Surgeon: Melina Schools, MD;  Location: [facility];  Service: Orthopedics;  Laterality: Right;  L4-L5 Microdiscectomy  . NASAL SEPTOPLASTY W/ TURBINOPLASTY Bilateral 01/26/2019   Procedure: NASAL SEPTOPLASTY WITH BILATERAL TURBINATE REDUCTION;  Surgeon: Leta Baptist, MD;  Location: [place];  Service: ENT;  Laterality: Bilateral;  . TONSILLECTOMY       Medications: Current Meds  Medication Sig  . aspirin 81 MG chewable tablet Chew 1 tablet (81 mg total) by mouth daily.  Marland Kitchen atorvastatin (LIPITOR) 80 MG tablet Take 1 tablet (80 mg total) by mouth daily at 6 PM.  . carvedilol (COREG) 12.5 MG tablet Take 1 tablet (12.5 mg total) by mouth 2 (two) times daily with a meal.  . cetirizine (ZYRTEC) 10 MG tablet Take 10 mg by mouth at bedtime.   Marland Kitchen ibuprofen (ADVIL) 200 MG tablet Take 400 mg by mouth every 6 (six) hours as needed for mild pain or moderate pain.  . Melatonin 10 MG TABS Take 1 tablet by mouth at bedtime.  . nitroGLYCERIN (NITROSTAT) 0.4 MG SL tablet Place 1 tablet (0.4 mg total) under the tongue every 5 (five) minutes x 3 doses as needed for chest pain.  Marland Kitchen omeprazole (PRILOSEC) 20 MG capsule Take 20 mg by mouth every morning.   . Potassium Citrate 15 MEQ (1620 MG) TBCR Take 1 tablet by mouth daily.  . ticagrelor (BRILINTA) 90 MG TABS tablet Take 1 tablet (90 mg total) by mouth 2 (two) times daily.  . [DISCONTINUED] atorvastatin (LIPITOR) 80 MG tablet Take 1 tablet (80 mg total) by mouth daily at 6 PM.  . [DISCONTINUED] carvedilol (COREG) 12.5 MG tablet Take 1 tablet (12.5 mg total) by mouth 2 (two) times daily with a meal.  . [DISCONTINUED] ticagrelor (BRILINTA) 90 MG TABS tablet Take 1 tablet (90 mg total) by mouth 2 (two) times  daily.     Allergies: No Known Allergies  Social History: The patient  reports that he has never smoked. He has never used smokeless tobacco. He reports that he does not drink alcohol or use drugs.   Family History: The patient's family history includes COPD in his mother; Cancer in his maternal grandfather; Diabetes in his mother; Heart disease in his maternal uncle; Hypertension in his mother; Stroke in his father and maternal grandmother.   Review of Systems: Please see the history of present illness.   All other systems are reviewed and negative.   Physical Exam: VS:  BP (!) 142/72   Pulse 79   Ht 5\' 9"  (1.753 m)   Wt 212 lb (96.2 kg)   SpO2 99%   BMI 31.31 kg/m  .  BMI Body mass index is 31.31 kg/m.  Wt Readings from Last 3 Encounters:  08/06/19 212 lb (96.2 kg)  07/20/19 212 lb 1.3 oz (  96.2 kg)  01/26/19 208 lb 15.9 oz (94.8 kg)    General: Pleasant. Well developed, well nourished and in no acute distress. May be just a bit antsy. BP by me was more elevated at 180/80.   HEENT: Normal.  Neck: Supple, no JVD, carotid bruits, or masses noted.  Cardiac: Regular rate and rhythm. No murmurs, rubs, or gallops. No edema.  Respiratory:  Lungs are clear to auscultation bilaterally with normal work of breathing.  GI: Soft and nontender.  MS: No deformity or atrophy. Gait and ROM intact.  Skin: Warm and dry. Color is normal.  Neuro:  Strength and sensation are intact and no gross focal deficits noted.  Psych: Alert, appropriate and with normal affect.   LABORATORY DATA:  EKG:  EKG is not ordered today.  Lab Results  Component Value Date   WBC 10.2 07/20/2019   HGB 15.9 07/20/2019   HCT 45.3 07/20/2019   PLT 224 07/20/2019   GLUCOSE 114 (H) 07/20/2019   CHOL 146 07/19/2019   TRIG 114 07/19/2019   HDL 34 (L) 07/19/2019   LDLCALC 89 07/19/2019   ALT 30 07/18/2019   AST 44 (H) 07/18/2019   NA 139 07/20/2019   K 3.6 07/20/2019   CL 106 07/20/2019   CREATININE 1.04  07/20/2019   BUN 13 07/20/2019   CO2 23 07/20/2019   TSH 3.317 07/18/2019   INR 1.1 07/19/2019   HGBA1C 5.5 07/18/2019     BNP (last 3 results) No results for input(s): BNP in the last 8760 hours.  ProBNP (last 3 results) No results for input(s): PROBNP in the last 8760 hours.   Other Studies Reviewed Today:  Echocardiogram 07/19/2019: 1. Left ventricular ejection fraction, by visual estimation, is 65 to 70%. The left ventricle has normal function. There is mildly increased left ventricular hypertrophy. 2. Left ventricular diastolic parameters are consistent with Grade I diastolic dysfunction (impaired relaxation). 3. The left ventricle has no regional wall motion abnormalities. 4. Global right ventricle has normal systolic function.The right ventricular size is normal. No increase in right ventricular wall thickness. 5. Left atrial size was normal. 6. Right atrial size was normal. 7. The mitral valve is grossly normal. Trivial mitral valve regurgitation. 8. The tricuspid valve is grossly normal. Tricuspid valve regurgitation is trivial. 9. The aortic valve is tricuspid. Aortic valve regurgitation is mild. Mild aortic valve sclerosis without stenosis. 10. The pulmonic valve was grossly normal. Pulmonic valve regurgitation is not visualized.  Left heart catheterization 07/19/2019:  Mid RCA lesion is 99% stenosed. This as the culprit lesion.  A drug-eluting stent was successfully placed using a STENT SYNERGY DES 3X16.  Post intervention, there is a 0% residual stenosis.  1st Diag lesion is 90% stenosed.  A drug-eluting stent was successfully placed using a STENT SYNERGY DES 2.25X20.  Post intervention, there is a 0% residual stenosis.  Prox LAD to Mid LAD lesion is 60% stenosed. DFR showed this lesion to be hemodynamically significant.  A drug-eluting stent was successfully placed using a STENT SYNERGY DES 3X28, postdilated to 3.5 mm. TIMI 3 flow remained in the  jailed diagonal.  Post intervention, there is a 0% residual stenosis.  The left ventricular systolic function is normal.  LV end diastolic pressure is normal.  The left ventricular ejection fraction is 55-65% by visual estimate.  There is no aortic valve stenosis.  Continue aggressive secondary prevention and RF modification along with DAPT.  Results communicated to wife.   Assessment/Plan:  1. NSTEMI with  multivessel CAD noted - preserved LV function - has had PCI to RCA, 1st DX and LAD. Recommened to have Brilinta for at least one month before considering switching to Plavix unless too symptomatic with shortness of breath - but with so many stents, want to avoid the possibility of exposure to stent thrombosis given 10 to 20% risk of nonresponder on Plavix. He is doing well. Currently doing well and tolerating DAPT without issue - surveillance lab today. Reviewed exercise goals. Needs aggressive risk factor modification. He seems quite motivated to make changes.   2. HLD - now on statin - recheck lab on return. Rechecking LFTs today.   3. HTN - overall control at home looks good - he will continue to monitor at home - I have left him on his current regimen.   4. GERD - on PPI - not discussed.   5. Mildly elevated LFTS - recheck today.   6. COVID-19 Education: The signs and symptoms of COVID-19 were discussed with the patient and how to seek care for testing (follow up with PCP or arrange E-visit).  The importance of social distancing, staying at home, hand hygiene and wearing a mask when out in public were discussed today.  Current medicines are reviewed with the patient today.  The patient does not have concerns regarding medicines other than what has been noted above.  The following changes have been made:  See above.  Labs/ tests ordered today include:    Orders Placed This Encounter  Procedures  . Basic metabolic panel  . CBC no Diff  . Hepatic function panel      Disposition:   FU with [place] in about 4 weeks - fasting labs on return.   Patient is agreeable to this plan and will call if any problems develop in the interim.   SignedTruitt Merle, NP  08/06/2019 10:31 AM  [facility] [address] [place], [place]  [address] Phone: [phone] Fax: [phone]

## 2019-08-06 ENCOUNTER — Other Ambulatory Visit: Payer: Self-pay

## 2019-08-06 ENCOUNTER — Ambulatory Visit (INDEPENDENT_AMBULATORY_CARE_PROVIDER_SITE_OTHER): Payer: PPO | Admitting: Nurse Practitioner

## 2019-08-06 ENCOUNTER — Encounter: Payer: Self-pay | Admitting: Nurse Practitioner

## 2019-08-06 VITALS — BP 142/72 | HR 79 | Ht 69.0 in | Wt 212.0 lb

## 2019-08-06 DIAGNOSIS — I214 Non-ST elevation (NSTEMI) myocardial infarction: Secondary | ICD-10-CM | POA: Diagnosis not present

## 2019-08-06 DIAGNOSIS — I10 Essential (primary) hypertension: Secondary | ICD-10-CM | POA: Diagnosis not present

## 2019-08-06 DIAGNOSIS — Z7189 Other specified counseling: Secondary | ICD-10-CM | POA: Diagnosis not present

## 2019-08-06 DIAGNOSIS — E7800 Pure hypercholesterolemia, unspecified: Secondary | ICD-10-CM | POA: Diagnosis not present

## 2019-08-06 DIAGNOSIS — I259 Chronic ischemic heart disease, unspecified: Secondary | ICD-10-CM | POA: Diagnosis not present

## 2019-08-06 LAB — CBC
Hematocrit: 45.9 % (ref 37.5–51.0)
Hemoglobin: 16.3 g/dL (ref 13.0–17.7)
MCH: 30.8 pg (ref 26.6–33.0)
MCHC: 35.5 g/dL (ref 31.5–35.7)
MCV: 87 fL (ref 79–97)
Platelets: 319 10*3/uL (ref 150–450)
RBC: 5.3 x10E6/uL (ref 4.14–5.80)
RDW: 12.4 % (ref 11.6–15.4)
WBC: 9.4 10*3/uL (ref 3.4–10.8)

## 2019-08-06 LAB — HEPATIC FUNCTION PANEL
ALT: 15 IU/L (ref 0–44)
AST: 22 IU/L (ref 0–40)
Albumin: 4.4 g/dL (ref 3.7–4.7)
Alkaline Phosphatase: 75 IU/L (ref 39–117)
Bilirubin Total: 0.6 mg/dL (ref 0.0–1.2)
Bilirubin, Direct: 0.21 mg/dL (ref 0.00–0.40)
Total Protein: 6.4 g/dL (ref 6.0–8.5)

## 2019-08-06 LAB — BASIC METABOLIC PANEL
BUN/Creatinine Ratio: 16 (ref 10–24)
BUN: 18 mg/dL (ref 8–27)
CO2: 25 mmol/L (ref 20–29)
Calcium: 9.4 mg/dL (ref 8.6–10.2)
Chloride: 102 mmol/L (ref 96–106)
Creatinine, Ser: 1.14 mg/dL (ref 0.76–1.27)
GFR calc Af Amer: 73 mL/min/{1.73_m2} (ref >59)
GFR calc non Af Amer: 63 mL/min/{1.73_m2} (ref >59)
Glucose: 87 mg/dL (ref 65–99)
Potassium: 4.5 mmol/L (ref 3.5–5.2)
Sodium: 140 mmol/L (ref 134–144)

## 2019-08-06 MED ORDER — CARVEDILOL 12.5 MG PO TABS: 12.5000 mg | ORAL_TABLET | Freq: Two times a day (BID) | ORAL | 3 refills | Status: DC

## 2019-08-06 MED ORDER — TICAGRELOR 90 MG PO TABS: 90.0000 mg | ORAL_TABLET | Freq: Two times a day (BID) | ORAL | 3 refills | Status: DC

## 2019-08-06 MED ORDER — ATORVASTATIN CALCIUM 80 MG PO TABS: 80.0000 mg | ORAL_TABLET | Freq: Every day | ORAL | 3 refills | Status: DC

## 2019-08-06 NOTE — Patient Instructions (Addendum)
After Visit Summary:  We will be checking the following labs today - BMET, HPF and CBC   Medication Instructions:    Continue with your current medicines.   I sent in your refills to [facility] for 90 day supply   If you need a refill on your cardiac medications before your next appointment, please call your pharmacy.     Testing/Procedures To Be Arranged:  N/A  Follow-Up:   See me or Dr. Tamala Julian back in about 4 weeks with fasting labs on return  See Dr. Tamala Julian 2 months later    At [facility], you and your health needs are our priority.  As part of our continuing mission to provide you with exceptional heart care, we have created designated Provider Care Teams.  These Care Teams include your primary Cardiologist (physician) and Advanced Practice Providers (APPs -  Physician Assistants and Nurse Practitioners) who all work together to provide you with the care you need, when you need it.  Special Instructions:  . Stay safe, stay home, wash your hands for at least 20 seconds and wear a mask when out in public.  . It was good to talk with you today.  Marland Kitchen Keep a check on your BP for [place]    Call the [place] office at [phone] if you have any questions, problems or concerns.

## 2019-08-17 ENCOUNTER — Telehealth (HOSPITAL_COMMUNITY): Payer: Self-pay

## 2019-08-17 NOTE — Telephone Encounter (Signed)
Called and spoke with pt in regards to CR, pt stated he is working out at home at this time.  Closed referral

## 2019-08-22 DIAGNOSIS — J310 Chronic rhinitis: Secondary | ICD-10-CM | POA: Diagnosis not present

## 2019-08-22 DIAGNOSIS — J343 Hypertrophy of nasal turbinates: Secondary | ICD-10-CM | POA: Diagnosis not present

## 2019-08-28 ENCOUNTER — Other Ambulatory Visit: Payer: Self-pay

## 2019-08-28 ENCOUNTER — Ambulatory Visit (INDEPENDENT_AMBULATORY_CARE_PROVIDER_SITE_OTHER): Payer: PPO | Admitting: Nurse Practitioner

## 2019-08-28 ENCOUNTER — Telehealth: Payer: Self-pay | Admitting: Interventional Cardiology

## 2019-08-28 ENCOUNTER — Encounter: Payer: Self-pay | Admitting: Nurse Practitioner

## 2019-08-28 VITALS — BP 154/80 | HR 80 | Ht 69.0 in | Wt 209.8 lb

## 2019-08-28 DIAGNOSIS — Z7189 Other specified counseling: Secondary | ICD-10-CM | POA: Diagnosis not present

## 2019-08-28 DIAGNOSIS — R002 Palpitations: Secondary | ICD-10-CM | POA: Diagnosis not present

## 2019-08-28 DIAGNOSIS — I259 Chronic ischemic heart disease, unspecified: Secondary | ICD-10-CM | POA: Diagnosis not present

## 2019-08-28 DIAGNOSIS — I214 Non-ST elevation (NSTEMI) myocardial infarction: Secondary | ICD-10-CM | POA: Diagnosis not present

## 2019-08-28 MED ORDER — CARVEDILOL 12.5 MG PO TABS: 18.7500 mg | ORAL_TABLET | Freq: Two times a day (BID) | ORAL | 3 refills | Status: DC

## 2019-08-28 NOTE — Telephone Encounter (Signed)
If he wants to be seen then we could do today or tomorrow.   Cecille Rubin

## 2019-08-28 NOTE — Telephone Encounter (Signed)
S/w pt stated had a restless night sleep felt heart fluttering, bp showed irregular heart beat.  Pt stated feels different than it has in the past since heartattack. Denies dizziness, lightgheadedness, SOB, syncope and nausea. .  Pt will come in today for EKG and appt with Cecille Rubin.

## 2019-08-28 NOTE — Patient Instructions (Addendum)
After Visit Summary:  We will be checking the following labs today - NONE   Medication Instructions:    Continue with your current medicines. BUT  I am going to increase your Coreg to 18.75 mg twice a day - this will be a pill and a half of your 12.5 mg tablets twice a day - I have sent this to your pharmacy   If you need a refill on your cardiac medications before your next appointment, please call your pharmacy.     Testing/Procedures To Be Arranged:  N/A  Follow-Up:   See [place] back as planned next month    At [facility], you and your health needs are our priority.  As part of our continuing mission to provide you with exceptional heart care, we have created designated Provider Care Teams.  These Care Teams include your primary Cardiologist (physician) and Advanced Practice Providers (APPs -  Physician Assistants and Nurse Practitioners) who all work together to provide you with the care you need, when you need it.  Special Instructions:  . Stay safe, stay home, wash your hands for at least 20 seconds and wear a mask when out in public.  . It was good to talk with you today.  Marland Kitchen You can look into the "[place] EKG device on [place] or . If you continue to have these "flutters" - we will place a heart monitor for 30 days.    Call the [place] office at [phone] if you have any questions, problems or concerns.

## 2019-08-28 NOTE — Progress Notes (Signed)
CARDIOLOGY OFFICE NOTE  Date:  08/28/2019    Randall Lopez Date of Birth: September 16, 1945 Medical Record [id number]  PCP:  Mayra Neer, MD  Cardiologist:  Jennings Books  Chief Complaint  Patient presents with  . Palpitations    Work in visit - seen for Dr. Tamala Julian    History of Present Illness: Randall Lopez is a 74 y.o. male who presents today for a work in visit. Seen for Dr. Tamala Julian.   He has a history of GERD, gout, HTN and tachycardia. Remote monitor for palpitations showing PACs.   Presented in December with nausea, epigastric discomfort and then chest pain - troponin was elevated consistent with NSTEMI. He underwent LHC which showed severe mRCA and 1st DX stenosis treated with PCI/DES. Also with moderate p-mLAD disease - found to be hemodynamically significant and treated with PCI/DES. Placed on DAPT. Transitioned from atenolol to Coreg for better BP control. High dose statin also started.   I then saw him earlier this month - he was doing well. Had some mild SOB with his Brilinta but he thought he could stay on. Otherwise, stable.   Phone call today - had palpitations earlier this morning - wanted to be seen. Thus added to my schedule for this morning.   The patient does not have symptoms concerning for COVID-19 infection (fever, chills, cough, or new shortness of breath).   Comes in today. Here alone. He notes that last night he had "flutters" in his chest - no pain. Not dizzy or lightheaded. Restless night. BP and HR higher last night. Feels better today. Does have some caffeine but not excessive - he likes Diet Dr. Malachi Bonds. Otherwise doing ok. Using his treadmill 3 times a day for total of 30 minutes.   Past Medical History:  Diagnosis Date  . Cough    low grade fever  . Enlarged prostate   . GERD (gastroesophageal reflux disease)    takes Omeprazole daily  . Gout    hx of,not on any meds  . History of kidney stones    x3 -pass 1, litho x2  .  Hypertension    takes Atenolol daily  . Sinus infection    completed z pak 12/16/15.  . Tachycardia     Past Surgical History:  Procedure Laterality Date  . ANTERIOR CRUCIATE LIGAMENT REPAIR Left 12/23/2015   Procedure: LEFT KNEE DIAGNOSTIC OPERATIVE ARTHROSCOPY, DEBRIDEMENT, RECONSTRUCTION ANTERIOR CRUCIATE LIGAMENT (ACL) WITH HAMSTRING GRAFT;  Surgeon: Meredith Pel, MD;  Location: [place] [facility];  Service: Orthopedics;  Laterality: Left;  LEFT KNEE DIAGNOSTIC OPERATIVE ARHTROSCOPY, DEBRIDEMENT, ANTERIOR CRUCIATE LIGAMENT RECONSTRUCTION IWTH HAMSTRING AUTOGRAFT.  Marland Kitchen BACK SURGERY    . CATARACT EXTRACTION, BILATERAL     last 09-14-14  . CHOLECYSTECTOMY    . COLONOSCOPY W/ POLYPECTOMY     past hx. polyps-benign  . COLONOSCOPY WITH PROPOFOL N/A 10/14/2014   Procedure: COLONOSCOPY WITH PROPOFOL;  Surgeon: Garlan Fair, MD;  [facility];  Service: Endoscopy;  Laterality: N/A;  . CORONARY STENT INTERVENTION N/A 07/19/2019   Procedure: CORONARY STENT INTERVENTION;  Surgeon: Jettie Booze, MD;  Location: [facility];  Service: Cardiovascular;  Laterality: N/A;  . CORONARY THROMBECTOMY N/A 07/19/2019   Procedure: Coronary Thrombectomy;  Surgeon: Jettie Booze, MD;  Location: [place];  Service: Cardiovascular;  Laterality: N/A;  . INTRAVASCULAR PRESSURE WIRE/FFR STUDY N/A 07/19/2019   Procedure: INTRAVASCULAR PRESSURE WIRE/FFR STUDY;  Surgeon: Jettie Booze, MD;  Location:  [place] [facility];  Service: Cardiovascular;  Laterality: N/A;  . LAPAROSCOPIC CHOLECYSTECTOMY SINGLE PORT  08/04/2012   Procedure: LAPAROSCOPIC CHOLECYSTECTOMY SINGLE PORT;  Surgeon: Adin Hector, MD;  [facility]: [facility];  Service: General;  Laterality: N/A;  LYSIS OF ADHESIONS with Intraoperative Cholangiogram   . LEFT HEART CATH AND CORONARY ANGIOGRAPHY N/A 07/19/2019   Procedure: LEFT HEART CATH AND CORONARY ANGIOGRAPHY;  Surgeon: Jettie Booze, MD;  Location: [place]  [facility];  Service: Cardiovascular;  Laterality: N/A;  . LITHOTRIPSY    . LUMBAR LAMINECTOMY/DECOMPRESSION MICRODISCECTOMY  12/17/2011   Procedure: LUMBAR LAMINECTOMY/DECOMPRESSION MICRODISCECTOMY;  Surgeon: Melina Schools, MD;  [facility];  Service: Orthopedics;  Laterality: Right;  L4-L5 Microdiscectomy  . NASAL SEPTOPLASTY W/ TURBINOPLASTY Bilateral 01/26/2019   Procedure: NASAL SEPTOPLASTY WITH BILATERAL TURBINATE REDUCTION;  Surgeon: Leta Baptist, MD;  Location: [facility];  Service: ENT;  Laterality: Bilateral;  . TONSILLECTOMY       Medications: Current Meds  Medication Sig  . aspirin 81 MG chewable tablet Chew 1 tablet (81 mg total) by mouth daily.  Marland Kitchen atorvastatin (LIPITOR) 80 MG tablet Take 1 tablet (80 mg total) by mouth daily at 6 PM.  . carvedilol (COREG) 12.5 MG tablet Take 1.5 tablets (18.75 mg total) by mouth 2 (two) times daily with a meal.  . cetirizine (ZYRTEC) 10 MG tablet Take 10 mg by mouth at bedtime.   Marland Kitchen ibuprofen (ADVIL) 200 MG tablet Take 400 mg by mouth every 6 (six) hours as needed for mild pain or moderate pain.  . Melatonin 10 MG TABS Take 1 tablet by mouth at bedtime.  . nitroGLYCERIN (NITROSTAT) 0.4 MG SL tablet Place 1 tablet (0.4 mg total) under the tongue every 5 (five) minutes x 3 doses as needed for chest pain.  Marland Kitchen omeprazole (PRILOSEC) 20 MG capsule Take 20 mg by mouth every morning.   . Potassium Citrate 15 MEQ (1620 MG) TBCR Take 1 tablet by mouth daily.  . ticagrelor (BRILINTA) 90 MG TABS tablet Take 1 tablet (90 mg total) by mouth 2 (two) times daily.  . [DISCONTINUED] carvedilol (COREG) 12.5 MG tablet Take 1 tablet (12.5 mg total) by mouth 2 (two) times daily with a meal.     Allergies: No Known Allergies  Social History: The patient  reports that he has never smoked. He has never used smokeless tobacco. He reports that he does not drink alcohol or use drugs.   Family History: The patient's family history includes COPD in his  mother; Cancer in his maternal grandfather; Diabetes in his mother; Heart disease in his maternal uncle; Hypertension in his mother; Stroke in his father and maternal grandmother.   Review of Systems: Please see the history of present illness.   All other systems are reviewed and negative.   Physical Exam: VS:  BP (!) 154/80   Pulse 80   Ht 5\' 9"  (1.753 m)   Wt 209 lb 12.8 oz (95.2 kg)   BMI 30.98 kg/m  .  BMI Body mass index is 30.98 kg/m.  Wt Readings from Last 3 Encounters:  08/28/19 209 lb 12.8 oz (95.2 kg)  08/06/19 212 lb (96.2 kg)  07/20/19 212 lb 1.3 oz (96.2 kg)    General: Pleasant. Alert and in no acute distress.   HEENT: Normal.  Neck: Supple, no JVD, carotid bruits, or masses noted.  Cardiac: Regular rate and rhythm. No murmurs, rubs, or gallops. No edema.  Respiratory:  Lungs are clear to auscultation  bilaterally with normal work of breathing.  GI: Soft and nontender.  MS: No deformity or atrophy. Gait and ROM intact.  Skin: Warm and dry. Color is normal.  Neuro:  Strength and sensation are intact and no gross focal deficits noted.  Psych: Alert, appropriate and with normal affect.   LABORATORY DATA:  EKG:  EKG is ordered today. This demonstrates NSR - HR is 80 and regular - no ectopic - no AF.  Lab Results  Component Value Date   WBC 9.4 08/06/2019   HGB 16.3 08/06/2019   HCT 45.9 08/06/2019   PLT 319 08/06/2019   GLUCOSE 87 08/06/2019   CHOL 146 07/19/2019   TRIG 114 07/19/2019   HDL 34 (L) 07/19/2019   LDLCALC 89 07/19/2019   ALT 15 08/06/2019   AST 22 08/06/2019   NA 140 08/06/2019   K 4.5 08/06/2019   CL 102 08/06/2019   CREATININE 1.14 08/06/2019   BUN 18 08/06/2019   CO2 25 08/06/2019   TSH 3.317 07/18/2019   INR 1.1 07/19/2019   HGBA1C 5.5 07/18/2019     BNP (last 3 results) No results for input(s): BNP in the last 8760 hours.  ProBNP (last 3 results) No results for input(s): PROBNP in the last 8760 hours.   Other Studies  Reviewed Today:  Echocardiogram 07/19/2019: 1. Left ventricular ejection fraction, by visual estimation, is 65 to 70%. The left ventricle has normal function. There is mildly increased left ventricular hypertrophy. 2. Left ventricular diastolic parameters are consistent with Grade I diastolic dysfunction (impaired relaxation). 3. The left ventricle has no regional wall motion abnormalities. 4. Global right ventricle has normal systolic function.The right ventricular size is normal. No increase in right ventricular wall thickness. 5. Left atrial size was normal. 6. Right atrial size was normal. 7. The mitral valve is grossly normal. Trivial mitral valve regurgitation. 8. The tricuspid valve is grossly normal. Tricuspid valve regurgitation is trivial. 9. The aortic valve is tricuspid. Aortic valve regurgitation is mild. Mild aortic valve sclerosis without stenosis. 10. The pulmonic valve was grossly normal. Pulmonic valve regurgitation is not visualized.  Left heart catheterization 07/19/2019:  Mid RCA lesion is 99% stenosed. This as the culprit lesion.  A drug-eluting stent was successfully placed using a STENT SYNERGY DES 3X16.  Post intervention, there is a 0% residual stenosis.  1st Diag lesion is 90% stenosed.  A drug-eluting stent was successfully placed using a STENT SYNERGY DES 2.25X20.  Post intervention, there is a 0% residual stenosis.  Prox LAD to Mid LAD lesion is 60% stenosed. DFR showed this lesion to be hemodynamically significant.  A drug-eluting stent was successfully placed using a STENT SYNERGY DES 3X28, postdilated to 3.5 mm. TIMI 3 flow remained in the jailed diagonal.  Post intervention, there is a 0% residual stenosis.  The left ventricular systolic function is normal.  LV end diastolic pressure is normal.  The left ventricular ejection fraction is 55-65% by visual estimate.  There is no aortic valve stenosis.  Continue aggressive secondary  prevention and RF modification along with DAPT.  Results communicated to wife.  Assessment/Plan:  1. Palpitations - remote monitor with PACs - will increase his Coreg today - if this continues - would place event monitor - also offered option of AliveCor Kardia device for his smart phone. Labs from earlier this month were fine.   2. Recent NSTEMI - has multivessel CAD - preserved LV function - with PCI to RCA, 1st DX and the LAD -  on DAPT with Brilinta which is tolerable for him. Dr. Tamala Julian had wanted to avoid possibility of exposure to stent thrombosis given 10 to 20% risk of non responder on Plavix. No chest pain. He has good exercise tolerance.   3. HTN - BP little lower at home - increasing Coreg today.   4. HLD - on statin - for lab on return.   5. COVID-19 Education: The signs and symptoms of COVID-19 were discussed with the patient and how to seek care for testing (follow up with PCP or arrange E-visit).  The importance of social distancing, staying at home, hand hygiene and wearing a mask when out in public were discussed today.  Current medicines are reviewed with the patient today.  The patient does not have concerns regarding medicines other than what has been noted above.  The following changes have been made:  See above.  Labs/ tests ordered today include:    Orders Placed This Encounter  Procedures  . EKG 12-Lead     Disposition:   FU with [place] as planned next month.    Patient is agreeable to this plan and will call if any problems develop in the interim.   SignedTruitt Merle, NP  08/28/2019 10:26 AM  [facility] [address] [place], [place]  [address] Phone: [phone] Fax: [phone]

## 2019-08-28 NOTE — Telephone Encounter (Signed)
Patient c/o Palpitations:  High priority if patient c/o lightheadedness, shortness of breath, or chest pain  1) How long have you had palpitations/irregular HR/ Afib? Are you having the symptoms now? Started this morning around 4:00  2) Are you currently experiencing lightheadedness, SOB or CP? no*  3) Do you have a history of afib (atrial fibrillation) or irregular heart rhythm? Years ago  Had PACS*  4) Have you checked your BP or HR? (document readings if available): 148/88 when this was happening  5) Are you experiencing any other symptoms? no

## 2019-09-05 NOTE — Progress Notes (Signed)
CARDIOLOGY OFFICE NOTE  Date:  09/12/2019    Randall Lopez Date of Birth: 03-16-1946 Medical Record [id number]  PCP:  Mayra Neer, MD  Cardiologist:  Jennings Books    Chief Complaint  Patient presents with  . Follow-up    Seen for Dr. Tamala Julian    History of Present Illness: Randall Lopez is a 74 y.o. male who presents today for a follow up visit. Seen for Dr. Tamala Julian.   He has a history of GERD, gout, HTN and tachycardia. Remote monitor for palpitations showing PACs.   Presented in December 2020 with nausea, epigastric discomfort and then chest pain - troponin was elevated consistent with NSTEMI. He underwent LHC which showed severe mRCA and 1st DX stenosis treated with PCI/DES. Also with moderate p-mLAD disease - found to be hemodynamically significant and treated with PCI/DES. Placed on DAPT. Transitioned from atenolol to Coreg for better BP control. High dose statin also started.   I then saw him for his post hospital visit in early January - he was doing well - mild SOB with Brilinta but tolerable.   Then seen as a work in visit about 2 weeks ago for palpitations. EKG was ok. Beta blocker was increased and we agreed to place monitor if had recurrence and also suggested the AliveCor Kardia for his phone that he could use as well.     The patient does not have symptoms concerning for COVID-19 infection (fever, chills, cough, or new shortness of breath).   Comes in today. Here alone. He is doing well. Just a rare "flutter" every now and then - nothing bothersome. No chest pain. Breathing is ok - he does have to use a little caffeine to offset the shortness of breath with his Brilinta - still very tolerable for him. He is walking on his treadmill with no issue. He is fasting today. He has had his first COVID vaccine. He has no concerns today.   Past Medical History:  Diagnosis Date  . Cough    low grade fever  . Enlarged prostate   . GERD (gastroesophageal reflux  disease)    takes Omeprazole daily  . Gout    hx of,not on any meds  . History of kidney stones    x3 -pass 1, litho x2  . Hypertension    takes Atenolol daily  . Sinus infection    completed z pak 12/16/15.  . Tachycardia     Past Surgical History:  Procedure Laterality Date  . ANTERIOR CRUCIATE LIGAMENT REPAIR Left 12/23/2015   Procedure: LEFT KNEE DIAGNOSTIC OPERATIVE ARTHROSCOPY, DEBRIDEMENT, RECONSTRUCTION ANTERIOR CRUCIATE LIGAMENT (ACL) WITH HAMSTRING GRAFT;  Surgeon: Meredith Pel, MD;  Location: [facility];  Service: Orthopedics;  Laterality: Left;  LEFT KNEE DIAGNOSTIC OPERATIVE ARHTROSCOPY, DEBRIDEMENT, ANTERIOR CRUCIATE LIGAMENT RECONSTRUCTION IWTH HAMSTRING AUTOGRAFT.  Marland Kitchen BACK SURGERY    . CATARACT EXTRACTION, BILATERAL     last 09-14-14  . CHOLECYSTECTOMY    . COLONOSCOPY W/ POLYPECTOMY     past hx. polyps-benign  . COLONOSCOPY WITH PROPOFOL N/A 10/14/2014   Procedure: COLONOSCOPY WITH PROPOFOL;  Surgeon: Garlan Fair, MD;  [facility];  Service: Endoscopy;  Laterality: N/A;  . CORONARY STENT INTERVENTION N/A 07/19/2019   Procedure: CORONARY STENT INTERVENTION;  Surgeon: Jettie Booze, MD;  Location: [facility];  Service: Cardiovascular;  Laterality: N/A;  . CORONARY THROMBECTOMY N/A 07/19/2019   Procedure: Coronary Thrombectomy;  Surgeon: Jettie Booze, MD;  [facility];  Service: Cardiovascular;  Laterality: N/A;  . INTRAVASCULAR PRESSURE WIRE/FFR STUDY N/A 07/19/2019   Procedure: INTRAVASCULAR PRESSURE WIRE/FFR STUDY;  Surgeon: Jettie Booze, MD;  Location: [facility] [facility];  Service: Cardiovascular;  Laterality: N/A;  . LAPAROSCOPIC CHOLECYSTECTOMY SINGLE PORT  08/04/2012   Procedure: LAPAROSCOPIC CHOLECYSTECTOMY SINGLE PORT;  Surgeon: Adin Hector, MD;  [facility];  Service: General;  Laterality: N/A;  LYSIS OF ADHESIONS with Intraoperative Cholangiogram   . LEFT HEART CATH AND CORONARY ANGIOGRAPHY  N/A 07/19/2019   Procedure: LEFT HEART CATH AND CORONARY ANGIOGRAPHY;  Surgeon: Jettie Booze, MD;  Location: [facility];  Service: Cardiovascular;  Laterality: N/A;  . LITHOTRIPSY    . LUMBAR LAMINECTOMY/DECOMPRESSION MICRODISCECTOMY  12/17/2011   Procedure: LUMBAR LAMINECTOMY/DECOMPRESSION MICRODISCECTOMY;  Surgeon: Melina Schools, MD;  [facility];  Service: Orthopedics;  Laterality: Right;  L4-L5 Microdiscectomy  . NASAL SEPTOPLASTY W/ TURBINOPLASTY Bilateral 01/26/2019   Procedure: NASAL SEPTOPLASTY WITH BILATERAL TURBINATE REDUCTION;  Surgeon: Leta Baptist, MD;  Location: [facility];  Service: ENT;  Laterality: Bilateral;  . TONSILLECTOMY       Medications: Current Meds  Medication Sig  . aspirin 81 MG chewable tablet Chew 1 tablet (81 mg total) by mouth daily.  Marland Kitchen atorvastatin (LIPITOR) 80 MG tablet Take 1 tablet (80 mg total) by mouth daily at 6 PM.  . carvedilol (COREG) 12.5 MG tablet Take 1.5 tablets (18.75 mg total) by mouth 2 (two) times daily with a meal.  . cetirizine (ZYRTEC) 10 MG tablet Take 10 mg by mouth at bedtime.   Marland Kitchen ibuprofen (ADVIL) 200 MG tablet Take 400 mg by mouth every 6 (six) hours as needed for mild pain or moderate pain.  . Melatonin 10 MG TABS Take 1 tablet by mouth at bedtime.  . nitroGLYCERIN (NITROSTAT) 0.4 MG SL tablet Place 1 tablet (0.4 mg total) under the tongue every 5 (five) minutes x 3 doses as needed for chest pain.  Marland Kitchen omeprazole (PRILOSEC) 20 MG capsule Take 20 mg by mouth every morning.   . Potassium Citrate 15 MEQ (1620 MG) TBCR Take 1 tablet by mouth daily.  . ticagrelor (BRILINTA) 90 MG TABS tablet Take 1 tablet (90 mg total) by mouth 2 (two) times daily.     Allergies: No Known Allergies  Social History: The patient  reports that he has never smoked. He has never used smokeless tobacco. He reports that he does not drink alcohol or use drugs.   Family History: The patient's family history includes COPD in his  mother; Cancer in his maternal grandfather; Diabetes in his mother; Heart disease in his maternal uncle; Hypertension in his mother; Stroke in his father and maternal grandmother.   Review of Systems: Please see the history of present illness.   All other systems are reviewed and negative.   Physical Exam: VS:  BP 114/60   Pulse 73   Ht 5\' 9"  (1.753 m)   Wt 209 lb 12.8 oz (95.2 kg)   SpO2 98%   BMI 30.98 kg/m  .  BMI Body mass index is 30.98 kg/m.  Wt Readings from Last 3 Encounters:  09/12/19 209 lb 12.8 oz (95.2 kg)  08/28/19 209 lb 12.8 oz (95.2 kg)  08/06/19 212 lb (96.2 kg)    General: Pleasant. Alert and in no acute distress.   Cardiac: Regular rate and rhythm. No murmurs, rubs, or gallops. No edema.  Respiratory:  Lungs are clear to auscultation bilaterally  with normal work of breathing.  MS: No deformity or atrophy. Gait and ROM intact.  Skin: Warm and dry. Color is normal.  Neuro:  Strength and sensation are intact and no gross focal deficits noted.  Psych: Alert, appropriate and with normal affect.   LABORATORY DATA:  EKG:  EKG is not ordered today.   Lab Results  Component Value Date   WBC 9.4 08/06/2019   HGB 16.3 08/06/2019   HCT 45.9 08/06/2019   PLT 319 08/06/2019   GLUCOSE 87 08/06/2019   CHOL 146 07/19/2019   TRIG 114 07/19/2019   HDL 34 (L) 07/19/2019   LDLCALC 89 07/19/2019   ALT 15 08/06/2019   AST 22 08/06/2019   NA 140 08/06/2019   K 4.5 08/06/2019   CL 102 08/06/2019   CREATININE 1.14 08/06/2019   BUN 18 08/06/2019   CO2 25 08/06/2019   TSH 3.317 07/18/2019   INR 1.1 07/19/2019   HGBA1C 5.5 07/18/2019     BNP (last 3 results) No results for input(s): BNP in the last 8760 hours.  ProBNP (last 3 results) No results for input(s): PROBNP in the last 8760 hours.   Other Studies Reviewed Today:  Echocardiogram 07/19/2019: 1. Left ventricular ejection fraction, by visual estimation, is 65 to 70%. The left ventricle has normal  function. There is mildly increased left ventricular hypertrophy. 2. Left ventricular diastolic parameters are consistent with Grade I diastolic dysfunction (impaired relaxation). 3. The left ventricle has no regional wall motion abnormalities. 4. Global right ventricle has normal systolic function.The right ventricular size is normal. No increase in right ventricular wall thickness. 5. Left atrial size was normal. 6. Right atrial size was normal. 7. The mitral valve is grossly normal. Trivial mitral valve regurgitation. 8. The tricuspid valve is grossly normal. Tricuspid valve regurgitation is trivial. 9. The aortic valve is tricuspid. Aortic valve regurgitation is mild. Mild aortic valve sclerosis without stenosis. 10. The pulmonic valve was grossly normal. Pulmonic valve regurgitation is not visualized.  Left heart catheterization 07/19/2019:  Mid RCA lesion is 99% stenosed. This as the culprit lesion.  A drug-eluting stent was successfully placed using a STENT SYNERGY DES 3X16.  Post intervention, there is a 0% residual stenosis.  1st Diag lesion is 90% stenosed.  A drug-eluting stent was successfully placed using a STENT SYNERGY DES 2.25X20.  Post intervention, there is a 0% residual stenosis.  Prox LAD to Mid LAD lesion is 60% stenosed. DFR showed this lesion to be hemodynamically significant.  A drug-eluting stent was successfully placed using a STENT SYNERGY DES 3X28, postdilated to 3.5 mm. TIMI 3 flow remained in the jailed diagonal.  Post intervention, there is a 0% residual stenosis.  The left ventricular systolic function is normal.  LV end diastolic pressure is normal.  The left ventricular ejection fraction is 55-65% by visual estimate.  There is no aortic valve stenosis.  Continue aggressive secondary prevention and RF modification along with DAPT.  Results communicated to wife.  Assessment/Plan:  1. Prior NSTEMI - with multivessel CAD -  preserved LV function - s/p PCI to RCA, 1st DX and the LAD - on DAPT with Brilinta which remains tolerable for him. Dr. Tamala Julian had wanted to avoid the possibility of exposure to stent thrombosis given 10 to 20% risk of non responder on Plavix. He is doing very well clinically. Wil continue with beta blocker and DAPT therapy along with statin. Continue with CV risk factor modification.   2. Palpitations - has had  remote monitor that showed PACs - we increased his Coreg - he is doing fine with this now.   3. HLD - rechecking lab today. He is on high intensity statin therapy.   4. HTN - BP is good - continue current dose of beta blocker.   5. COVID-19 Education: The signs and symptoms of COVID-19 were discussed with the patient and how to seek care for testing (follow up with PCP or arrange E-visit).  The importance of social distancing, staying at home, hand hygiene and wearing a mask when out in public were discussed today. He has had his first COVID vaccine.   Current medicines are reviewed with the patient today.  The patient does not have concerns regarding medicines other than what has been noted above.  The following changes have been made:  See above.  Labs/ tests ordered today include:    Orders Placed This Encounter  Procedures  . Basic metabolic panel  . CBC  . Hepatic function panel  . Lipid panel     Disposition:   FU with Dr. Tamala Julian as planned next month. I am happy to see back as planned.   Patient is agreeable to this plan and will call if any problems develop in the interim.   SignedTruitt Merle, NP  09/12/2019 8:22 AM  [facility] [address] [place], [place]  [address] Phone: [phone] Fax: [phone]

## 2019-09-12 ENCOUNTER — Encounter: Payer: Self-pay | Admitting: Nurse Practitioner

## 2019-09-12 ENCOUNTER — Other Ambulatory Visit: Payer: Self-pay

## 2019-09-12 ENCOUNTER — Ambulatory Visit (INDEPENDENT_AMBULATORY_CARE_PROVIDER_SITE_OTHER): Payer: PPO | Admitting: Nurse Practitioner

## 2019-09-12 VITALS — BP 114/60 | HR 73 | Ht 69.0 in | Wt 209.8 lb

## 2019-09-12 DIAGNOSIS — E7800 Pure hypercholesterolemia, unspecified: Secondary | ICD-10-CM | POA: Diagnosis not present

## 2019-09-12 DIAGNOSIS — I259 Chronic ischemic heart disease, unspecified: Secondary | ICD-10-CM | POA: Diagnosis not present

## 2019-09-12 DIAGNOSIS — I214 Non-ST elevation (NSTEMI) myocardial infarction: Secondary | ICD-10-CM | POA: Diagnosis not present

## 2019-09-12 DIAGNOSIS — Z7189 Other specified counseling: Secondary | ICD-10-CM

## 2019-09-12 DIAGNOSIS — R002 Palpitations: Secondary | ICD-10-CM | POA: Diagnosis not present

## 2019-09-12 DIAGNOSIS — I10 Essential (primary) hypertension: Secondary | ICD-10-CM

## 2019-09-12 LAB — CBC
Hematocrit: 44.7 % (ref 37.5–51.0)
Hemoglobin: 15.5 g/dL (ref 13.0–17.7)
MCH: 29.9 pg (ref 26.6–33.0)
MCHC: 34.7 g/dL (ref 31.5–35.7)
MCV: 86 fL (ref 79–97)
Platelets: 238 10*3/uL (ref 150–450)
RBC: 5.19 x10E6/uL (ref 4.14–5.80)
RDW: 12.6 % (ref 11.6–15.4)
WBC: 7.5 10*3/uL (ref 3.4–10.8)

## 2019-09-12 LAB — BASIC METABOLIC PANEL
BUN/Creatinine Ratio: 15 (ref 10–24)
BUN: 17 mg/dL (ref 8–27)
CO2: 24 mmol/L (ref 20–29)
Calcium: 9.4 mg/dL (ref 8.6–10.2)
Chloride: 101 mmol/L (ref 96–106)
Creatinine, Ser: 1.17 mg/dL (ref 0.76–1.27)
GFR calc Af Amer: 71 mL/min/{1.73_m2} (ref >59)
GFR calc non Af Amer: 61 mL/min/{1.73_m2} (ref >59)
Glucose: 101 mg/dL — ABNORMAL HIGH (ref 65–99)
Potassium: 4.2 mmol/L (ref 3.5–5.2)
Sodium: 141 mmol/L (ref 134–144)

## 2019-09-12 LAB — HEPATIC FUNCTION PANEL
ALT: 12 IU/L (ref 0–44)
AST: 18 IU/L (ref 0–40)
Albumin: 4.3 g/dL (ref 3.7–4.7)
Alkaline Phosphatase: 83 IU/L (ref 39–117)
Bilirubin Total: 0.9 mg/dL (ref 0.0–1.2)
Bilirubin, Direct: 0.25 mg/dL (ref 0.00–0.40)
Total Protein: 6.5 g/dL (ref 6.0–8.5)

## 2019-09-12 LAB — LIPID PANEL
Chol/HDL Ratio: 2.8 ratio (ref 0.0–5.0)
Cholesterol, Total: 86 mg/dL — ABNORMAL LOW (ref 100–199)
HDL: 31 mg/dL — ABNORMAL LOW (ref >39)
LDL Chol Calc (NIH): 34 mg/dL (ref 0–99)
Triglycerides: 116 mg/dL (ref 0–149)
VLDL Cholesterol Cal: 21 mg/dL (ref 5–40)

## 2019-09-12 NOTE — Patient Instructions (Addendum)
After Visit Summary:  We will be checking the following labs today - BMET, CBC, HPF and lipids   Medication Instructions:    Continue with your current medicines.    If you need a refill on your cardiac medications before your next appointment, please call your pharmacy.     Testing/Procedures To Be Arranged:  N/A  Follow-Up:   See Dr. Tamala Julian as planned next month.     At [facility], you and your health needs are our priority.  As part of our continuing mission to provide you with exceptional heart care, we have created designated Provider Care Teams.  These Care Teams include your primary Cardiologist (physician) and Advanced Practice Providers (APPs -  Physician Assistants and Nurse Practitioners) who all work together to provide you with the care you need, when you need it.  Special Instructions:  . Stay safe, stay home, wash your hands for at least 20 seconds and wear a mask when out in public.  . It was good to talk with you today.  Marland Kitchen Keep up the good work!   Call the [place] office at [phone] if you have any questions, problems or concerns.

## 2019-10-31 ENCOUNTER — Ambulatory Visit: Payer: PPO | Admitting: Interventional Cardiology

## 2019-11-13 NOTE — Progress Notes (Signed)
Cardiology Office Note:    Date:  11/14/2019   ID:  Randall Lopez, DOB 08/10/1945, MRN [id number]  PCP:  Mayra Neer, MD  Cardiologist:  Sinclair Grooms, MD   Referring MD: Mayra Neer, MD   Chief Complaint  Patient presents with  . Coronary Artery Disease    History of Present Illness:    Randall Lopez is a 74 y.o. male with a hx of GERD, gout, HTN, PACs, non-ST elevation MI with drug-eluting stent implantation in first diagonal, mid LAD, and right coronary December 2020.  He is doing well.  Only complaint is occasional hyperpnea related to the Brilinta.  He has been physically active without angina.  He denies orthopnea, PND, palpitations, and nitroglycerin use.  No blood in his urine or stool.  Overall feels that things are going well.  Past Medical History:  Diagnosis Date  . Cough    low grade fever  . Enlarged prostate   . GERD (gastroesophageal reflux disease)    takes Omeprazole daily  . Gout    hx of,not on any meds  . History of kidney stones    x3 -pass 1, litho x2  . Hypertension    takes Atenolol daily  . Sinus infection    completed z pak 12/16/15.  . Tachycardia     Past Surgical History:  Procedure Laterality Date  . ANTERIOR CRUCIATE LIGAMENT REPAIR Left 12/23/2015   Procedure: LEFT KNEE DIAGNOSTIC OPERATIVE ARTHROSCOPY, DEBRIDEMENT, RECONSTRUCTION ANTERIOR CRUCIATE LIGAMENT (ACL) WITH HAMSTRING GRAFT;  Surgeon: Meredith Pel, MD;  Location: [place];  Service: Orthopedics;  Laterality: Left;  LEFT KNEE DIAGNOSTIC OPERATIVE ARHTROSCOPY, DEBRIDEMENT, ANTERIOR CRUCIATE LIGAMENT RECONSTRUCTION IWTH HAMSTRING AUTOGRAFT.  Marland Kitchen BACK SURGERY    . CATARACT EXTRACTION, BILATERAL     last 09-14-14  . CHOLECYSTECTOMY    . COLONOSCOPY W/ POLYPECTOMY     past hx. polyps-benign  . COLONOSCOPY WITH PROPOFOL N/A 10/14/2014   Procedure: COLONOSCOPY WITH PROPOFOL;  Surgeon: Garlan Fair, MD;  Location: [facility];  Service: Endoscopy;  Laterality: N/A;    . CORONARY STENT INTERVENTION N/A 07/19/2019   Procedure: CORONARY STENT INTERVENTION;  Surgeon: Jettie Booze, MD;  Location: [place] [facility];  Service: Cardiovascular;  Laterality: N/A;  . CORONARY THROMBECTOMY N/A 07/19/2019   Procedure: Coronary Thrombectomy;  Surgeon: Jettie Booze, MD;  Location: [place] [facility];  Service: Cardiovascular;  Laterality: N/A;  . INTRAVASCULAR PRESSURE WIRE/FFR STUDY N/A 07/19/2019   Procedure: INTRAVASCULAR PRESSURE WIRE/FFR STUDY;  Surgeon: Jettie Booze, MD;  Location: [place] [facility];  Service: Cardiovascular;  Laterality: N/A;  . LAPAROSCOPIC CHOLECYSTECTOMY SINGLE PORT  08/04/2012   Procedure: LAPAROSCOPIC CHOLECYSTECTOMY SINGLE PORT;  Surgeon: Adin Hector, MD;  Location: [facility];  Service: General;  Laterality: N/A;  LYSIS OF ADHESIONS with Intraoperative Cholangiogram   . LEFT HEART CATH AND CORONARY ANGIOGRAPHY N/A 07/19/2019   Procedure: LEFT HEART CATH AND CORONARY ANGIOGRAPHY;  Surgeon: Jettie Booze, MD;  Location: [place] [facility];  Service: Cardiovascular;  Laterality: N/A;  . LITHOTRIPSY    . LUMBAR LAMINECTOMY/DECOMPRESSION MICRODISCECTOMY  12/17/2011   Procedure: LUMBAR LAMINECTOMY/DECOMPRESSION MICRODISCECTOMY;  Surgeon: Melina Schools, MD;  Location: [facility];  Service: Orthopedics;  Laterality: Right;  L4-L5 Microdiscectomy  . NASAL SEPTOPLASTY W/ TURBINOPLASTY Bilateral 01/26/2019   Procedure: NASAL SEPTOPLASTY WITH BILATERAL TURBINATE REDUCTION;  Surgeon: Leta Baptist, MD;  Location: [place];  Service: ENT;  Laterality: Bilateral;  . TONSILLECTOMY  Current Medications: Current Meds  Medication Sig  . aspirin 81 MG chewable tablet Chew 1 tablet (81 mg total) by mouth daily.  Marland Kitchen atorvastatin (LIPITOR) 80 MG tablet Take 1 tablet (80 mg total) by mouth daily at 6 PM.  . carvedilol (COREG) 12.5 MG tablet Take 1.5 tablets (18.75 mg total) by mouth 2 (two) times daily with a meal.  .  cetirizine (ZYRTEC) 10 MG tablet Take 10 mg by mouth at bedtime.   Marland Kitchen ibuprofen (ADVIL) 200 MG tablet Take 400 mg by mouth every 6 (six) hours as needed for mild pain or moderate pain.  . Melatonin 10 MG TABS Take 1 tablet by mouth at bedtime.  . nitroGLYCERIN (NITROSTAT) 0.4 MG SL tablet Place 1 tablet (0.4 mg total) under the tongue every 5 (five) minutes x 3 doses as needed for chest pain.  Marland Kitchen omeprazole (PRILOSEC) 20 MG capsule Take 20 mg by mouth every morning.   . Potassium Citrate 15 MEQ (1620 MG) TBCR Take 1 tablet by mouth daily.  . ticagrelor (BRILINTA) 90 MG TABS tablet Take 1 tablet (90 mg total) by mouth 2 (two) times daily.     Allergies:   Patient has no known allergies.   Social History   Socioeconomic History  . Marital status: Married    Spouse name: Not on file  . Number of children: Not on file  . Years of education: Not on file  . Highest education level: Not on file  Occupational History  . Not on file  Tobacco Use  . Smoking status: Never Smoker  . Smokeless tobacco: Never Used  Substance and Sexual Activity  . Alcohol use: No  . Drug use: No  . Sexual activity: Yes  Other Topics Concern  . Not on file  Social History Narrative  . Not on file   Social Determinants of Health   Financial Resource Strain:   . Difficulty of Paying Living Expenses:   Food Insecurity:   . Worried About Charity fundraiser in the Last Year:   . Arboriculturist in the Last Year:   Transportation Needs:   . Film/video editor (Medical):   Marland Kitchen Lack of Transportation (Non-Medical):   Physical Activity:   . Days of Exercise per Week:   . Minutes of Exercise per Session:   Stress:   . Feeling of Stress :   Social Connections:   . Frequency of Communication with Friends and Family:   . Frequency of Social Gatherings with Friends and Family:   . Attends Religious Services:   . Active Member of Clubs or Organizations:   . Attends Archivist Meetings:   Marland Kitchen Marital  Status:      Family History: The patient's family history includes COPD in his mother; Cancer in his maternal grandfather; Diabetes in his mother; Heart disease in his maternal uncle; Hypertension in his mother; Stroke in his father and maternal grandmother.  ROS:   Please see the history of present illness.    Denies other medication intolerances.  Sleeping well.  Denies snoring.  All other systems reviewed and are negative.  EKGs/Labs/Other Studies Reviewed:    The following studies were reviewed today: No new data.  Reviewed coronary angiography with the patient.  EKG:  EKG not repeated  Recent Labs: 07/18/2019: Magnesium 1.9; TSH 3.317 09/12/2019: ALT 12; BUN 17; Creatinine, Ser 1.17; Hemoglobin 15.5; Platelets 238; Potassium 4.2; Sodium 141  Recent Lipid Panel    Component Value Date/Time  CHOL 86 (L) 09/12/2019 0824   TRIG 116 09/12/2019 0824   HDL 31 (L) 09/12/2019 0824   CHOLHDL 2.8 09/12/2019 0824   CHOLHDL 4.3 07/19/2019 0256   VLDL 23 07/19/2019 0256   LDLCALC 34 09/12/2019 0824    Physical Exam:    VS:  BP (!) 142/64   Pulse 62   Ht 5\' 9"  (1.753 m)   Wt 205 lb 12.8 oz (93.4 kg)   SpO2 99%   BMI 30.39 kg/m     Wt Readings from Last 3 Encounters:  11/14/19 205 lb 12.8 oz (93.4 kg)  09/12/19 209 lb 12.8 oz (95.2 kg)  08/28/19 209 lb 12.8 oz (95.2 kg)     GEN: Healthy-appearing. No acute distress HEENT: Normal NECK: No JVD. LYMPHATICS: No lymphadenopathy CARDIAC:  RRR without murmur, gallop, or edema. VASCULAR:  Normal Pulses. No bruits. RESPIRATORY:  Clear to auscultation without rales, wheezing or rhonchi  ABDOMEN: Soft, non-tender, non-distended, No pulsatile mass, MUSCULOSKELETAL: No deformity  SKIN: Warm and dry NEUROLOGIC:  Alert and oriented x 3 PSYCHIATRIC:  Normal affect   ASSESSMENT:    1. Non-ST elevation (NSTEMI) myocardial infarction ([place])   2. Pure hypercholesterolemia   3. Gastroesophageal reflux disease with esophagitis without  hemorrhage   4. Palpitations   5. Essential hypertension   6. Educated about COVID-19 virus infection    PLAN:    In order of problems listed above:  1. Stable status post multisite PCI involving the LAD diagonal and right coronary.  He is on aspirin and Brilinta.  Initial intolerance to Brilinta has now been overcome.  Plan to continue Brilinta until July 1 at which time he will be loaded with 300 mg of Plavix followed by 75 mg/day along with an aspirin.  Secondary prevention discussed 2. Current LDL is less than 40.  Continue high intensity statin therapy, Lipitor, for the time being. 3. Denies reflux symptoms. 4. Resolved. 5. Blood pressure needs to be followed.  Target is 130/80.  States that at home his blood pressures are running in the 123456 mmHg systolic range.  He should continue carvedilol as he is currently taking. 6. He has received the COVID-19 vaccine.  He is still practicing social distancing, mask wearing, and hygiene.  Overall education and awareness concerning primary/secondary risk prevention was discussed in detail: LDL less than 70, hemoglobin A1c less than 7, blood pressure target less than 130/80 mmHg, >150 minutes of moderate aerobic activity per week, avoidance of smoking, weight control (via diet and exercise), and continued surveillance/management of/for obstructive sleep apnea.    Medication Adjustments/Labs and Tests Ordered: Current medicines are reviewed at length with the patient today.  Concerns regarding medicines are outlined above.  No orders of the defined types were placed in this encounter.  No orders of the defined types were placed in this encounter.   Patient Instructions  Medication Instructions:  1) On July 1st you will take your final dose of Brilinta.  Take 300mg  (4 tablets) of Plavix along with your last Brilinta dose.  After that, you will take one tablet of Plavix daily.  *If you need a refill on your cardiac medications before your next  appointment, please call your pharmacy*   Lab Work: None If you have labs (blood work) drawn today and your tests are completely normal, you will receive your results only by: Marland Kitchen MyChart Message (if you have MyChart) OR . A paper copy in the mail If you have any lab test that is  abnormal or we need to change your treatment, we will call you to review the results.   Testing/Procedures: None   Follow-Up: At [facility], you and your health needs are our priority.  As part of our continuing mission to provide you with exceptional heart care, we have created designated Provider Care Teams.  These Care Teams include your primary Cardiologist (physician) and Advanced Practice Providers (APPs -  Physician Assistants and Nurse Practitioners) who all work together to provide you with the care you need, when you need it.  We recommend signing up for the patient portal called "MyChart".  Sign up information is provided on this After Visit Summary.  MyChart is used to connect with patients for Virtual Visits (Telemedicine).  Patients are able to view lab/test results, encounter notes, upcoming appointments, etc.  Non-urgent messages can be sent to your provider as well.   To learn more about what you can do with MyChart, go to [email or web address].    Your next appointment:   6 month(s)  The format for your next appointment:   In Person  Provider:   You may see Sinclair Grooms, MD or one of the following Advanced Practice Providers on your designated Care Team:    Truitt Merle, NP  Cecilie Kicks, NP  Kathyrn Drown, NP    Other Instructions      Signed, Sinclair Grooms, MD  11/14/2019 4:42 PM    [place]

## 2019-11-14 ENCOUNTER — Ambulatory Visit (INDEPENDENT_AMBULATORY_CARE_PROVIDER_SITE_OTHER): Payer: PPO | Admitting: Interventional Cardiology

## 2019-11-14 ENCOUNTER — Other Ambulatory Visit: Payer: Self-pay

## 2019-11-14 ENCOUNTER — Encounter: Payer: Self-pay | Admitting: Interventional Cardiology

## 2019-11-14 VITALS — BP 142/64 | HR 62 | Ht 69.0 in | Wt 205.8 lb

## 2019-11-14 DIAGNOSIS — I214 Non-ST elevation (NSTEMI) myocardial infarction: Secondary | ICD-10-CM | POA: Diagnosis not present

## 2019-11-14 DIAGNOSIS — Z7189 Other specified counseling: Secondary | ICD-10-CM | POA: Diagnosis not present

## 2019-11-14 DIAGNOSIS — R002 Palpitations: Secondary | ICD-10-CM | POA: Diagnosis not present

## 2019-11-14 DIAGNOSIS — I10 Essential (primary) hypertension: Secondary | ICD-10-CM | POA: Diagnosis not present

## 2019-11-14 DIAGNOSIS — K2100 Gastro-esophageal reflux disease with esophagitis, without bleeding: Secondary | ICD-10-CM | POA: Diagnosis not present

## 2019-11-14 DIAGNOSIS — E7800 Pure hypercholesterolemia, unspecified: Secondary | ICD-10-CM

## 2019-11-14 NOTE — Patient Instructions (Signed)
Medication Instructions:  1) On July 1st you will take your final dose of Brilinta.  Take 300mg  (4 tablets) of Plavix along with your last Brilinta dose.  After that, you will take one tablet of Plavix daily.  *If you need a refill on your cardiac medications before your next appointment, please call your pharmacy*   Lab Work: None If you have labs (blood work) drawn today and your tests are completely normal, you will receive your results only by: Marland Kitchen MyChart Message (if you have MyChart) OR . A paper copy in the mail If you have any lab test that is abnormal or we need to change your treatment, we will call you to review the results.   Testing/Procedures: None   Follow-Up: At [facility], you and your health needs are our priority.  As part of our continuing mission to provide you with exceptional heart care, we have created designated Provider Care Teams.  These Care Teams include your primary Cardiologist (physician) and Advanced Practice Providers (APPs -  Physician Assistants and Nurse Practitioners) who all work together to provide you with the care you need, when you need it.  We recommend signing up for the patient portal called "MyChart".  Sign up information is provided on this After Visit Summary.  MyChart is used to connect with patients for Virtual Visits (Telemedicine).  Patients are able to view lab/test results, encounter notes, upcoming appointments, etc.  Non-urgent messages can be sent to your provider as well.   To learn more about what you can do with MyChart, go to [email or web address].    Your next appointment:   6 month(s)  The format for your next appointment:   In Person  Provider:   You may see Sinclair Grooms, MD or one of the following Advanced Practice Providers on your designated Care Team:    Truitt Merle, NP  Cecilie Kicks, NP  Kathyrn Drown, NP    Other Instructions

## 2019-12-11 ENCOUNTER — Telehealth: Payer: Self-pay | Admitting: Interventional Cardiology

## 2019-12-11 DIAGNOSIS — S6992XA Unspecified injury of left wrist, hand and finger(s), initial encounter: Secondary | ICD-10-CM | POA: Diagnosis not present

## 2019-12-11 DIAGNOSIS — S61219A Laceration without foreign body of unspecified finger without damage to nail, initial encounter: Secondary | ICD-10-CM | POA: Diagnosis not present

## 2019-12-11 NOTE — Telephone Encounter (Signed)
Left message for the dental office to please call our back and confirm how many teeth are being extracted.

## 2019-12-11 NOTE — Telephone Encounter (Signed)
1. What dental office are you calling from? Dr Payton Spark   2. What is your office phone number?[phone]   3. What is your fax number? [phone]  4. What type of procedure is the patient having performed?  Extraction   5. What date is procedure scheduled or is the patient there now? 12-13-19 (if the patient is at the dentist's office question goes to their cardiologist if he/she is in the office.  If not, question should go to the DOD).   6. What is your question (ex. Antibiotics prior to procedure, holding medication-we need to know how long dentist wants pt to hold med)?  Need to know about holding his blood thinner

## 2019-12-11 NOTE — Telephone Encounter (Signed)
   Primary Cardiologist: Sinclair Grooms, MD  Chart reviewed as part of pre-operative protocol coverage. Simple dental extractions are considered low risk procedures per guidelines and generally do not require any specific cardiac clearance. It is also generally accepted that for simple extractions and dental cleanings, there is no need to interrupt blood thinner therapy.   SBE prophylaxis is not required for the patient from cardiac standpoint based on records.  Will route to [facility] callback staff to clarify this is just for one (or 1-3) uncomplicated extraction. (I.e. not a surgical procedure requiring actual anesthesia.) If it's confirmed to be a simple dental extraction, please route this message to the requesting dentist. Thanks!  [name], PA-C 12/11/2019, 3:35 PM

## 2019-12-12 NOTE — Telephone Encounter (Signed)
Dental office called back and states only 1 tooth to be extracted. I will update pre op team.

## 2019-12-12 NOTE — Telephone Encounter (Signed)
   Primary Cardiologist: Sinclair Grooms, MD  Chart reviewed as part of pre-operative protocol coverage. Simple dental extractions are considered low risk procedures per guidelines and generally do not require any specific cardiac clearance. It is also generally accepted that for simple extractions and dental cleanings, there is no need to interrupt blood thinner therapy.   SBE prophylaxis is not required for the patient.  I will route this recommendation to the requesting party via Epic fax function and remove from pre-op pool.  Please call with questions.  Kerin Ransom, PA-C 12/12/2019, 2:43 PM

## 2019-12-12 NOTE — Telephone Encounter (Signed)
Per notes from Melina Copa, [facility] since it is a simple extraction I will fax clearance to dental office and remove from the pre op call back.

## 2020-01-08 ENCOUNTER — Telehealth: Payer: PPO | Admitting: Nurse Practitioner

## 2020-01-08 DIAGNOSIS — M545 Low back pain, unspecified: Secondary | ICD-10-CM

## 2020-01-08 MED ORDER — CYCLOBENZAPRINE HCL 10 MG PO TABS: 10.0000 mg | ORAL_TABLET | Freq: Three times a day (TID) | ORAL | 1 refills | Status: DC | PRN

## 2020-01-08 NOTE — Progress Notes (Signed)

## 2020-01-25 ENCOUNTER — Telehealth: Payer: Self-pay | Admitting: *Deleted

## 2020-01-25 MED ORDER — PANTOPRAZOLE SODIUM 40 MG PO TBEC: 40.0000 mg | DELAYED_RELEASE_TABLET | Freq: Every day | ORAL | 3 refills | Status: DC

## 2020-01-25 MED ORDER — CLOPIDOGREL BISULFATE 75 MG PO TABS: 75.0000 mg | ORAL_TABLET | Freq: Every day | ORAL | 3 refills | Status: DC

## 2020-01-25 NOTE — Telephone Encounter (Signed)
-----   Message from Loren Racer, RN sent at 11/14/2019  4:40 PM EDT ----- Regarding: Change to Plavix on 01/31/20 Call pt and remind him to change to Plavix.  Needs to take 300mg  along with last dose of Brilinta.

## 2020-01-25 NOTE — Telephone Encounter (Signed)
Patient returning call.

## 2020-01-25 NOTE — Telephone Encounter (Signed)
Left message to call back  

## 2020-01-25 NOTE — Telephone Encounter (Signed)
Spoke with pt and reviewed information.  Pt mentioned that he takes Omeprazole.  Advised we will need to change this to Protonix due to interactions with Plavix and Omeprazole.  Pt verbalized understanding and was appreciative for call.

## 2020-02-15 ENCOUNTER — Encounter: Payer: Self-pay | Admitting: Pharmacist

## 2020-05-08 DIAGNOSIS — Z0000 Encounter for general adult medical examination without abnormal findings: Secondary | ICD-10-CM | POA: Diagnosis not present

## 2020-05-08 DIAGNOSIS — K219 Gastro-esophageal reflux disease without esophagitis: Secondary | ICD-10-CM | POA: Diagnosis not present

## 2020-05-08 DIAGNOSIS — Z87442 Personal history of urinary calculi: Secondary | ICD-10-CM | POA: Diagnosis not present

## 2020-05-08 DIAGNOSIS — I2510 Atherosclerotic heart disease of native coronary artery without angina pectoris: Secondary | ICD-10-CM | POA: Diagnosis not present

## 2020-05-08 DIAGNOSIS — M109 Gout, unspecified: Secondary | ICD-10-CM | POA: Diagnosis not present

## 2020-05-08 DIAGNOSIS — E7800 Pure hypercholesterolemia, unspecified: Secondary | ICD-10-CM | POA: Diagnosis not present

## 2020-05-08 DIAGNOSIS — Z23 Encounter for immunization: Secondary | ICD-10-CM | POA: Diagnosis not present

## 2020-05-08 DIAGNOSIS — I119 Hypertensive heart disease without heart failure: Secondary | ICD-10-CM | POA: Diagnosis not present

## 2020-05-08 NOTE — Progress Notes (Signed)
Cardiology Office Note:    Date:  05/12/2020   ID:  Randall Lopez, DOB [birth year], MRN [id number]  PCP:  Mayra Neer, MD  Cardiologist:  Randall Grooms, MD   Referring MD: Mayra Neer, MD   No chief complaint on file.   History of Present Illness:    Randall Lopez is a 74 y.o. male with a hx of GERD, gout, HTN, PACs, non-ST elevation MI with drug-eluting stent implantation in first diagonal, mid LAD, and right coronary December 2020.  He voices no cardiac complaints.  He is not having chest pain.  He is staying physically active.  No medication side effects.  He was having bruising on Brilinta.  This is completely resolved after decreasing therapy to Plavix and aspirin.  He is very active at home.  He is not having limitations in his physical activity.  May not always get 150 minutes of moderate activity.  Past Medical History:  Diagnosis Date  . Cough    low grade fever  . Enlarged prostate   . GERD (gastroesophageal reflux disease)    takes Omeprazole daily  . Gout    hx of,not on any meds  . History of kidney stones    x3 -pass 1, litho x2  . Hypertension    takes Atenolol daily  . Sinus infection    completed z pak 12/16/15.  . Tachycardia     Past Surgical History:  Procedure Laterality Date  . ANTERIOR CRUCIATE LIGAMENT REPAIR Left 12/23/2015   Procedure: LEFT KNEE DIAGNOSTIC OPERATIVE ARTHROSCOPY, DEBRIDEMENT, RECONSTRUCTION ANTERIOR CRUCIATE LIGAMENT (ACL) WITH HAMSTRING GRAFT;  Surgeon: Meredith Pel, MD;  Location: [place];  Service: Orthopedics;  Laterality: Left;  LEFT KNEE DIAGNOSTIC OPERATIVE ARHTROSCOPY, DEBRIDEMENT, ANTERIOR CRUCIATE LIGAMENT RECONSTRUCTION IWTH HAMSTRING AUTOGRAFT.  Marland Kitchen BACK SURGERY    . CATARACT EXTRACTION, BILATERAL     last 09-14-14  . CHOLECYSTECTOMY    . COLONOSCOPY W/ POLYPECTOMY     past hx. polyps-benign  . COLONOSCOPY WITH PROPOFOL N/A 10/14/2014   Procedure: COLONOSCOPY WITH PROPOFOL;  Surgeon: Garlan Fair,  MD;  Location: [facility];  Service: Endoscopy;  Laterality: N/A;  . CORONARY STENT INTERVENTION N/A 07/19/2019   Procedure: CORONARY STENT INTERVENTION;  Surgeon: Jettie Booze, MD;  Location: [place] [facility];  Service: Cardiovascular;  Laterality: N/A;  . CORONARY THROMBECTOMY N/A 07/19/2019   Procedure: Coronary Thrombectomy;  Surgeon: Jettie Booze, MD;  Location: [facility];  Service: Cardiovascular;  Laterality: N/A;  . INTRAVASCULAR PRESSURE WIRE/FFR STUDY N/A 07/19/2019   Procedure: INTRAVASCULAR PRESSURE WIRE/FFR STUDY;  Surgeon: Jettie Booze, MD;  Location: [place] [facility];  Service: Cardiovascular;  Laterality: N/A;  . LAPAROSCOPIC CHOLECYSTECTOMY SINGLE PORT  08/04/2012   Procedure: LAPAROSCOPIC CHOLECYSTECTOMY SINGLE PORT;  Surgeon: Adin Hector, MD;  Location: [facility];  Service: General;  Laterality: N/A;  LYSIS OF ADHESIONS with Intraoperative Cholangiogram   . LEFT HEART CATH AND CORONARY ANGIOGRAPHY N/A 07/19/2019   Procedure: LEFT HEART CATH AND CORONARY ANGIOGRAPHY;  Surgeon: Jettie Booze, MD;  Location: [place] [facility];  Service: Cardiovascular;  Laterality: N/A;  . LITHOTRIPSY    . LUMBAR LAMINECTOMY/DECOMPRESSION MICRODISCECTOMY  12/17/2011   Procedure: LUMBAR LAMINECTOMY/DECOMPRESSION MICRODISCECTOMY;  Surgeon: Melina Schools, MD;  Location: [facility];  Service: Orthopedics;  Laterality: Right;  L4-L5 Microdiscectomy  . NASAL SEPTOPLASTY W/ TURBINOPLASTY Bilateral 01/26/2019   Procedure: NASAL SEPTOPLASTY WITH BILATERAL TURBINATE REDUCTION;  Surgeon: Leta Baptist, MD;  Location:  [place];  Service: ENT;  Laterality: Bilateral;  . TONSILLECTOMY      Current Medications: Current Meds  Medication Sig  . aspirin 81 MG chewable tablet Chew 1 tablet (81 mg total) by mouth daily.  Marland Kitchen atorvastatin (LIPITOR) 80 MG tablet Take 1 tablet (80 mg total) by mouth daily at 6 PM.  . carvedilol (COREG) 12.5 MG tablet Take 1.5 tablets  (18.75 mg total) by mouth 2 (two) times daily with a meal.  . cetirizine (ZYRTEC) 10 MG tablet Take 10 mg by mouth at bedtime.   . clopidogrel (PLAVIX) 75 MG tablet Take 1 tablet (75 mg total) by mouth daily.  . cyclobenzaprine (FLEXERIL) 10 MG tablet Take 1 tablet (10 mg total) by mouth 3 (three) times daily as needed for muscle spasms.  . Melatonin 10 MG TABS Take 1 tablet by mouth at bedtime.  . nitroGLYCERIN (NITROSTAT) 0.4 MG SL tablet Place 1 tablet (0.4 mg total) under the tongue every 5 (five) minutes x 3 doses as needed for chest pain.  . pantoprazole (PROTONIX) 40 MG tablet Take 1 tablet (40 mg total) by mouth daily.  . Potassium Citrate 15 MEQ (1620 MG) TBCR Take 1 tablet by mouth daily.     Allergies:   Patient has no known allergies.   Social History   Socioeconomic History  . Marital status: Married    Spouse name: Not on file  . Number of children: Not on file  . Years of education: Not on file  . Highest education level: Not on file  Occupational History  . Not on file  Tobacco Use  . Smoking status: Never Smoker  . Smokeless tobacco: Never Used  Substance and Sexual Activity  . Alcohol use: No  . Drug use: No  . Sexual activity: Yes  Other Topics Concern  . Not on file  Social History Narrative  . Not on file   Social Determinants of Health   Financial Resource Strain:   . Difficulty of Paying Living Expenses: Not on file  Food Insecurity:   . Worried About Charity fundraiser in the Last Year: Not on file  . Ran Out of Food in the Last Year: Not on file  Transportation Needs:   . Lack of Transportation (Medical): Not on file  . Lack of Transportation (Non-Medical): Not on file  Physical Activity:   . Days of Exercise per Week: Not on file  . Minutes of Exercise per Session: Not on file  Stress:   . Feeling of Stress : Not on file  Social Connections:   . Frequency of Communication with Friends and Family: Not on file  . Frequency of Social  Gatherings with Friends and Family: Not on file  . Attends Religious Services: Not on file  . Active Member of Clubs or Organizations: Not on file  . Attends Archivist Meetings: Not on file  . Marital Status: Not on file     Family History: The patient's family history includes COPD in his mother; Cancer in his maternal grandfather; Diabetes in his mother; Heart disease in his maternal uncle; Hypertension in his mother; Stroke in his father and maternal grandmother.  ROS:   Please see the history of present illness.    No blood in his urine or stool.  Does not tolerate cold weather very well.  Once the weather becomes cold he will begin using his in-home exercise equipment.  All other systems reviewed and are negative.  EKGs/Labs/Other Studies Reviewed:    The following studies were reviewed today:  CATH and PCI December 2020: ACS presentation Diagnostic Dominance: Right  Intervention     EKG:  EKG EKG is not performed today  Recent Labs: 07/18/2019: Magnesium 1.9; TSH 3.317 09/12/2019: ALT 12; BUN 17; Creatinine, Ser 1.17; Hemoglobin 15.5; Platelets 238; Potassium 4.2; Sodium 141  Recent Lipid Panel    Component Value Date/Time   CHOL 86 (L) 09/12/2019 0824   TRIG 116 09/12/2019 0824   HDL 31 (L) 09/12/2019 0824   CHOLHDL 2.8 09/12/2019 0824   CHOLHDL 4.3 07/19/2019 0256   VLDL 23 07/19/2019 0256   LDLCALC 34 09/12/2019 0824    Physical Exam:    VS:  BP (!) 146/64   Pulse 66   Ht 5\' 9"  (1.753 m)   Wt 206 lb (93.4 kg)   SpO2 98%   BMI 30.42 kg/m     Wt Readings from Last 3 Encounters:  05/12/20 206 lb (93.4 kg)  11/14/19 205 lb 12.8 oz (93.4 kg)  09/12/19 209 lb 12.8 oz (95.2 kg)     GEN: Slightly overweight.  BMI of 30.4.. No acute distress HEENT: Normal NECK: No JVD. LYMPHATICS: No lymphadenopathy CARDIAC: S4 gallop present.  RRR without murmur, S3 gallop, or edema. VASCULAR:  Normal Pulses. No bruits. RESPIRATORY:  Clear to auscultation  without rales, wheezing or rhonchi  ABDOMEN: Soft, non-tender, non-distended, No pulsatile mass, MUSCULOSKELETAL: No deformity  SKIN: Warm and dry NEUROLOGIC:  Alert and oriented x 3 PSYCHIATRIC:  Normal affect   ASSESSMENT:    1. Coronary artery disease involving native coronary artery of native heart without angina pectoris   2. Non-ST elevation (NSTEMI) myocardial infarction ([place])   3. Pure hypercholesterolemia   4. Palpitations   5. Essential hypertension   6. Educated about COVID-19 virus infection   7. Preoperative cardiovascular examination    PLAN:    In order of problems listed above:  1. Neuro prevention discussed.  Systolic blood pressure is too high today.  When rechecked it was 138/64.  LDL was excellent when checked less than a year ago on high intensity statin therapy.  He needs to increase moderate activity to achieve 150 minutes/week. 2. No recurrent prolonged chest pain.  No nitroglycerin use. 3. Continue Lipitor 80 mg/day.  Lipid panel in February. 4. No palpitations or complaints. 5. Monitor blood pressure at home twice per week over the next 3 weeks.  We will make a decision about add on therapy such as chlorthalidone or ARB depending upon the results that we get.  In the meantime I have asked him to concentrate on weight loss, and achieving a less than 3 g/day sodium diet.  Also asked him to incorporate fresh fruit and leafy vegetables to increase potassium supplementation. 6. He has been vaccinated and his not suffering any consequences of Covid. 7. He will be switched to Plavix monotherapy in December 2021.  He is due to have a colonoscopy in January.  It will be okay to pause therapy if needed relative to Plavix to allow the procedure.  At that time he will be greater than 12 months out from multistent procedure performed by Dr. Jake Samples nicely.  Clinical follow-up in 1 year.   Medication Adjustments/Labs and Tests Ordered: Current medicines are reviewed at  length with the patient today.  Concerns regarding medicines are outlined above.  Orders Placed This Encounter  Procedures  . Lipid panel  . Hepatic function panel  . Basic  metabolic panel  . HgB A1c   No orders of the defined types were placed in this encounter.   Patient Instructions  Medication Instructions:  1) You may DISCONTINUE Aspirin on December 17th, 2021  *If you need a refill on your cardiac medications before your next appointment, please call your pharmacy*   Lab Work: Lipid, Liver, A1C and BMET in February.  You will need to be fasting for these labs (nothing to eat or drink after midnight except water and black coffee).  If you have labs (blood work) drawn today and your tests are completely normal, you will receive your results only by: Marland Kitchen MyChart Message (if you have MyChart) OR . A paper copy in the mail If you have any lab test that is abnormal or we need to change your treatment, we will call you to review the results.   Testing/Procedures: None   Follow-Up: At [facility], you and your health needs are our priority.  As part of our continuing mission to provide you with exceptional heart care, we have created designated Provider Care Teams.  These Care Teams include your primary Cardiologist (physician) and Advanced Practice Providers (APPs -  Physician Assistants and Nurse Practitioners) who all work together to provide you with the care you need, when you need it.  We recommend signing up for the patient portal called "MyChart".  Sign up information is provided on this After Visit Summary.  MyChart is used to connect with patients for Virtual Visits (Telemedicine).  Patients are able to view lab/test results, encounter notes, upcoming appointments, etc.  Non-urgent messages can be sent to your provider as well.   To learn more about what you can do with MyChart, go to [email or web address].    Your next appointment:   12 month(s)  The format for  your next appointment:   In Person  Provider:   You may see Randall Grooms, MD or one of the following Advanced Practice Providers on your designated Care Team:    Truitt Merle, NP  Cecilie Kicks, NP  Kathyrn Drown, NP    Other Instructions  Monitor your blood pressure regularly and let us know if it remains elevated.  Contact the office in a couple of weeks with those blood pressure readings.      Signed, Randall Grooms, MD  05/12/2020 3:21 PM    [facility]

## 2020-05-12 ENCOUNTER — Encounter: Payer: Self-pay | Admitting: Interventional Cardiology

## 2020-05-12 ENCOUNTER — Ambulatory Visit (INDEPENDENT_AMBULATORY_CARE_PROVIDER_SITE_OTHER): Payer: PPO | Admitting: Interventional Cardiology

## 2020-05-12 ENCOUNTER — Other Ambulatory Visit: Payer: Self-pay

## 2020-05-12 VITALS — BP 146/64 | HR 66 | Ht 69.0 in | Wt 206.0 lb

## 2020-05-12 DIAGNOSIS — R002 Palpitations: Secondary | ICD-10-CM

## 2020-05-12 DIAGNOSIS — Z7189 Other specified counseling: Secondary | ICD-10-CM

## 2020-05-12 DIAGNOSIS — I214 Non-ST elevation (NSTEMI) myocardial infarction: Secondary | ICD-10-CM

## 2020-05-12 DIAGNOSIS — E7800 Pure hypercholesterolemia, unspecified: Secondary | ICD-10-CM

## 2020-05-12 DIAGNOSIS — I10 Essential (primary) hypertension: Secondary | ICD-10-CM

## 2020-05-12 DIAGNOSIS — I2510 Atherosclerotic heart disease of native coronary artery without angina pectoris: Secondary | ICD-10-CM

## 2020-05-12 DIAGNOSIS — Z01810 Encounter for preprocedural cardiovascular examination: Secondary | ICD-10-CM

## 2020-05-12 NOTE — Patient Instructions (Addendum)
Medication Instructions:  1) You may DISCONTINUE Aspirin on December 17th, 2021  *If you need a refill on your cardiac medications before your next appointment, please call your pharmacy*   Lab Work: Lipid, Liver, A1C and BMET in February.  You will need to be fasting for these labs (nothing to eat or drink after midnight except water and black coffee).  If you have labs (blood work) drawn today and your tests are completely normal, you will receive your results only by: Marland Kitchen MyChart Message (if you have MyChart) OR . A paper copy in the mail If you have any lab test that is abnormal or we need to change your treatment, we will call you to review the results.   Testing/Procedures: None   Follow-Up: At [facility], you and your health needs are our priority.  As part of our continuing mission to provide you with exceptional heart care, we have created designated Provider Care Teams.  These Care Teams include your primary Cardiologist (physician) and Advanced Practice Providers (APPs -  Physician Assistants and Nurse Practitioners) who all work together to provide you with the care you need, when you need it.  We recommend signing up for the patient portal called "MyChart".  Sign up information is provided on this After Visit Summary.  MyChart is used to connect with patients for Virtual Visits (Telemedicine).  Patients are able to view lab/test results, encounter notes, upcoming appointments, etc.  Non-urgent messages can be sent to your provider as well.   To learn more about what you can do with MyChart, go to [email or web address].    Your next appointment:   12 month(s)  The format for your next appointment:   In Person  Provider:   You may see Sinclair Grooms, MD or one of the following Advanced Practice Providers on your designated Care Team:    Truitt Merle, NP  Cecilie Kicks, NP  Kathyrn Drown, NP    Other Instructions  Monitor your blood pressure regularly and  let us know if it remains elevated.  Contact the office in a couple of weeks with those blood pressure readings.

## 2020-06-02 DIAGNOSIS — Z961 Presence of intraocular lens: Secondary | ICD-10-CM | POA: Diagnosis not present

## 2020-06-02 DIAGNOSIS — D3131 Benign neoplasm of right choroid: Secondary | ICD-10-CM | POA: Diagnosis not present

## 2020-06-09 DIAGNOSIS — N200 Calculus of kidney: Secondary | ICD-10-CM | POA: Diagnosis not present

## 2020-06-09 DIAGNOSIS — R82998 Other abnormal findings in urine: Secondary | ICD-10-CM | POA: Diagnosis not present

## 2020-07-07 MED ORDER — NITROGLYCERIN 0.4 MG SL SUBL: 0.4000 mg | SUBLINGUAL_TABLET | SUBLINGUAL | 3 refills | Status: DC | PRN

## 2020-08-02 ENCOUNTER — Other Ambulatory Visit: Payer: Self-pay | Admitting: Nurse Practitioner

## 2020-08-07 ENCOUNTER — Telehealth: Payer: Self-pay

## 2020-08-07 NOTE — Telephone Encounter (Signed)
   [facility] Pre-operative Risk Assessment    HEARTCARE STAFF: - Please ensure there is not already an duplicate clearance open for this procedure. - Under Visit Info/Reason for Call, type in Other and utilize the format Clearance MM/DD/YY or Clearance TBD. Do not use dashes or single digits. - If request is for dental extraction, please clarify the # of teeth to be extracted.  Request for surgical clearance:  1. What type of surgery is being performed? Screening repeat colonoscopy   2. When is this surgery scheduled? 10/13/20   3. What type of clearance is required (medical clearance vs. Pharmacy clearance to hold med vs. Both)? Pharmacy  4. Are there any medications that need to be held prior to surgery and how long? Plavix   5. Practice name and name of physician performing surgery? [facility] Dr. Therisa Doyne   6. What is the office phone number? [phone]   7.   What is the office fax number? [phone]  8.   Anesthesia type (None, local, MAC, general) ? None   Mady Haagensen 08/07/2020, 11:16 AM  _________________________________________________________________   (provider comments below)

## 2020-09-11 ENCOUNTER — Other Ambulatory Visit: Payer: Self-pay

## 2020-09-11 ENCOUNTER — Other Ambulatory Visit: Payer: PPO

## 2020-09-11 DIAGNOSIS — E7800 Pure hypercholesterolemia, unspecified: Secondary | ICD-10-CM

## 2020-09-11 DIAGNOSIS — I2510 Atherosclerotic heart disease of native coronary artery without angina pectoris: Secondary | ICD-10-CM

## 2020-09-12 LAB — BASIC METABOLIC PANEL
BUN/Creatinine Ratio: 14 (ref 10–24)
BUN: 15 mg/dL (ref 8–27)
CO2: 27 mmol/L (ref 20–29)
Calcium: 9.1 mg/dL (ref 8.6–10.2)
Chloride: 104 mmol/L (ref 96–106)
Creatinine, Ser: 1.04 mg/dL (ref 0.76–1.27)
GFR calc Af Amer: 81 mL/min/{1.73_m2} (ref >59)
GFR calc non Af Amer: 70 mL/min/{1.73_m2} (ref >59)
Glucose: 96 mg/dL (ref 65–99)
Potassium: 4.3 mmol/L (ref 3.5–5.2)
Sodium: 141 mmol/L (ref 134–144)

## 2020-09-12 LAB — HEPATIC FUNCTION PANEL
ALT: 12 IU/L (ref 0–44)
AST: 13 IU/L (ref 0–40)
Albumin: 4.3 g/dL (ref 3.7–4.7)
Alkaline Phosphatase: 62 IU/L (ref 44–121)
Bilirubin Total: 0.6 mg/dL (ref 0.0–1.2)
Bilirubin, Direct: 0.19 mg/dL (ref 0.00–0.40)
Total Protein: 6.3 g/dL (ref 6.0–8.5)

## 2020-09-12 LAB — LIPID PANEL
Chol/HDL Ratio: 2.8 ratio (ref 0.0–5.0)
Cholesterol, Total: 88 mg/dL — ABNORMAL LOW (ref 100–199)
HDL: 31 mg/dL — ABNORMAL LOW (ref >39)
LDL Chol Calc (NIH): 36 mg/dL (ref 0–99)
Triglycerides: 116 mg/dL (ref 0–149)
VLDL Cholesterol Cal: 21 mg/dL (ref 5–40)

## 2020-09-12 LAB — HEMOGLOBIN A1C
Est. average glucose Bld gHb Est-mCnc: 105 mg/dL
Hgb A1c MFr Bld: 5.3 % (ref 4.8–5.6)

## 2020-10-03 ENCOUNTER — Other Ambulatory Visit: Payer: Self-pay | Admitting: Nurse Practitioner

## 2020-10-06 NOTE — Telephone Encounter (Signed)
° °  Primary Cardiologist: Sinclair Grooms, MD  Chart reviewed as part of pre-operative protocol coverage. Given past medical history and time since last visit, based on ACC/AHA guidelines, Randall Lopez would be at acceptable risk for the planned procedure without further cardiovascular testing.   His Plavix may be held for 5 days prior to his colonoscopy.  Please resume as soon as hemostasis is achieved.  I will route this recommendation to the requesting party via Epic fax function and remove from pre-op pool.  Please call with questions.  Randall Ng. Randall Hoskie NP-C    10/06/2020, 3:41 PM [facility] [place] [address] Office [phone] Fax [phone]

## 2020-10-06 NOTE — Telephone Encounter (Signed)
[facility] called in to fu on this clearance.  She stated pt procedure is next wee 3/14  Best number [phone]

## 2020-10-08 DIAGNOSIS — Z01812 Encounter for preprocedural laboratory examination: Secondary | ICD-10-CM | POA: Diagnosis not present

## 2020-10-13 DIAGNOSIS — D123 Benign neoplasm of transverse colon: Secondary | ICD-10-CM | POA: Diagnosis not present

## 2020-10-13 DIAGNOSIS — Z86010 Personal history of colonic polyps: Secondary | ICD-10-CM | POA: Diagnosis not present

## 2020-10-13 DIAGNOSIS — D120 Benign neoplasm of cecum: Secondary | ICD-10-CM | POA: Diagnosis not present

## 2020-10-13 DIAGNOSIS — K648 Other hemorrhoids: Secondary | ICD-10-CM | POA: Diagnosis not present

## 2020-10-15 DIAGNOSIS — D123 Benign neoplasm of transverse colon: Secondary | ICD-10-CM | POA: Diagnosis not present

## 2020-10-15 DIAGNOSIS — D120 Benign neoplasm of cecum: Secondary | ICD-10-CM | POA: Diagnosis not present

## 2020-10-16 DIAGNOSIS — R6889 Other general symptoms and signs: Secondary | ICD-10-CM | POA: Diagnosis not present

## 2020-10-31 ENCOUNTER — Other Ambulatory Visit: Payer: Self-pay | Admitting: Interventional Cardiology

## 2021-01-12 ENCOUNTER — Other Ambulatory Visit: Payer: Self-pay | Admitting: Interventional Cardiology

## 2021-03-13 ENCOUNTER — Other Ambulatory Visit: Payer: Self-pay | Admitting: Interventional Cardiology

## 2021-03-28 IMAGING — DX DG CHEST 1V PORT
1 series · 1 of 1 positions shown · non-contrast
Comparison: July 18, 2019

CLINICAL DATA: Increasing shortness of breath.

EXAM:
PORTABLE CHEST 1 VIEW

[chest ap]
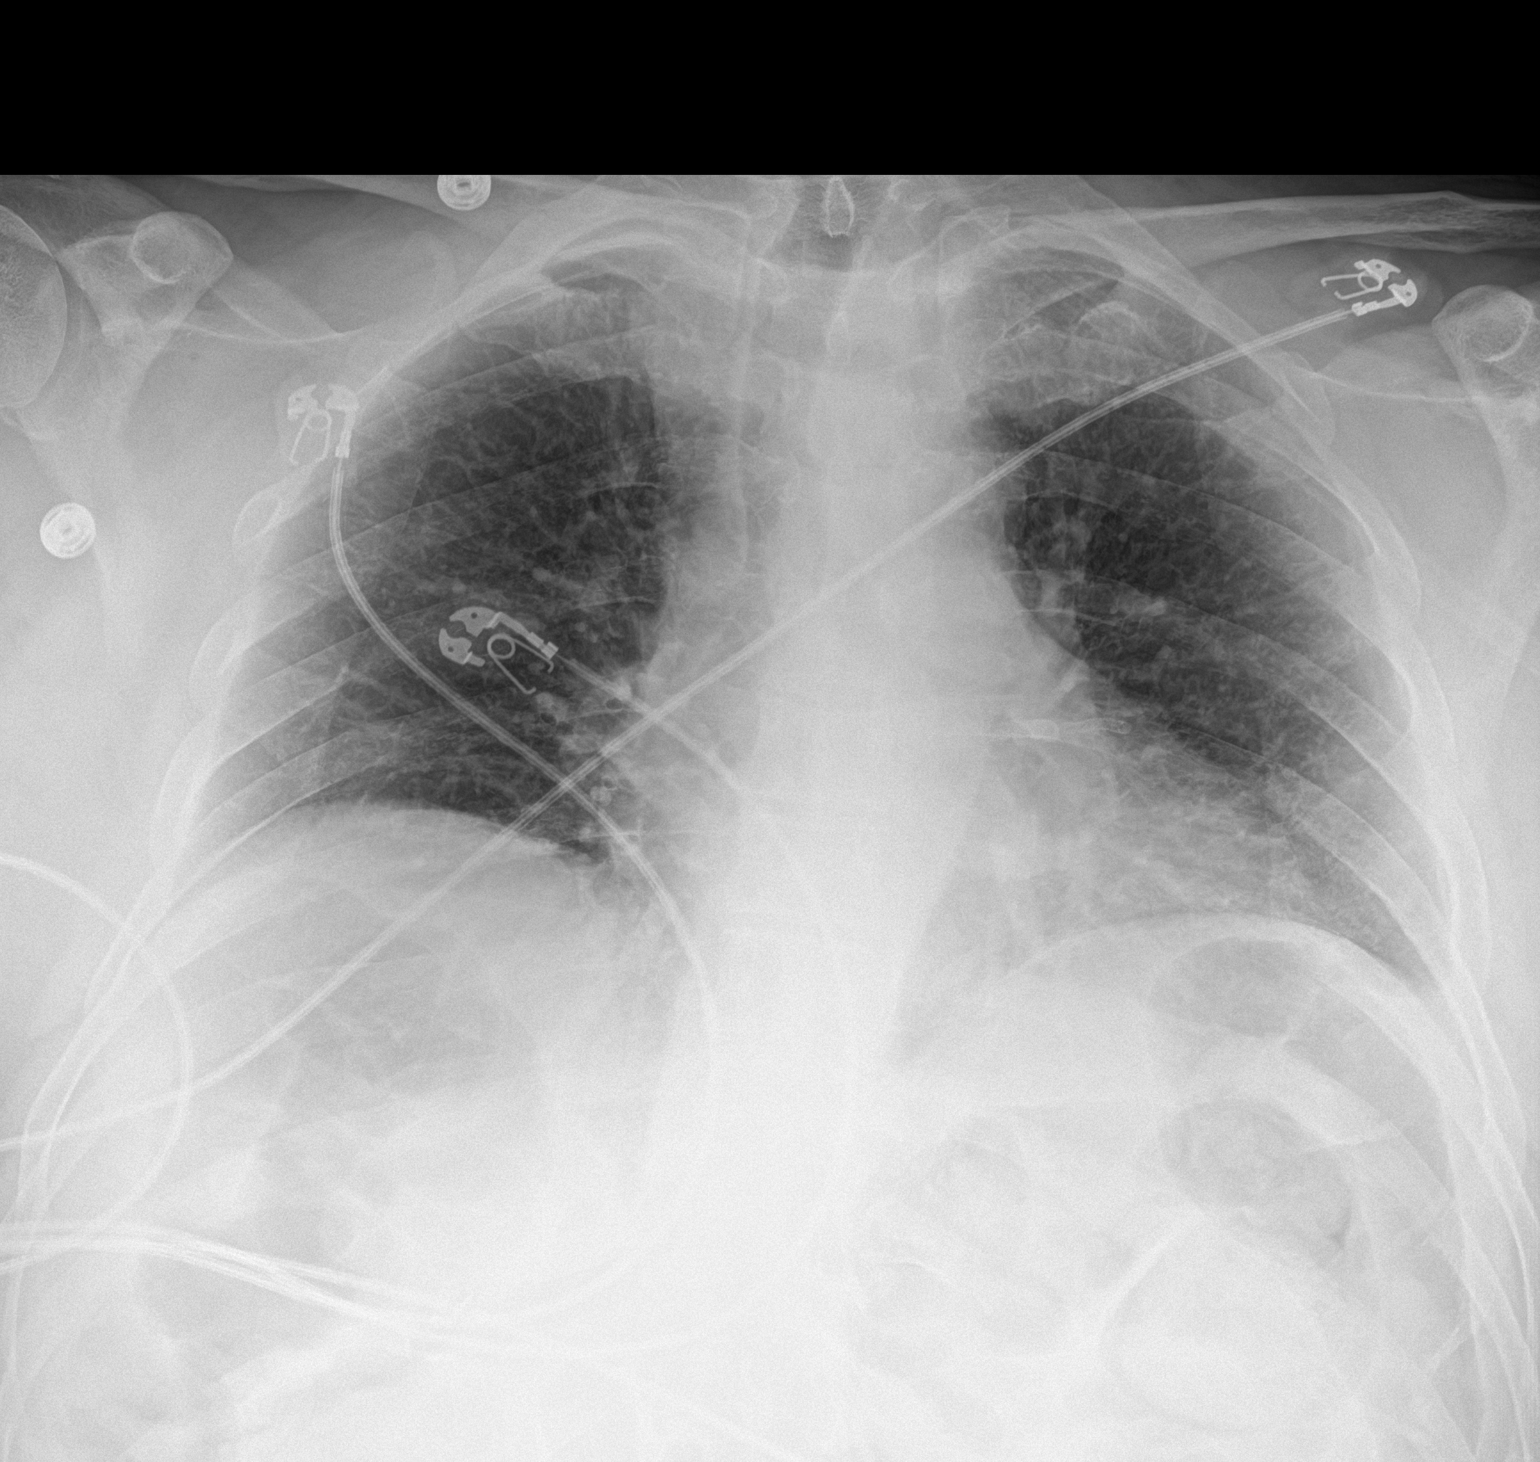

[1 of 1 positions shown; findings below may reference images not displayed]

FINDINGS: The lung volumes are low. There are possible new streaky bibasilar
airspace opacities. There is no pneumothorax. The interstitial lung
markings are slightly prominent. The trachea is shifted to the right
with some thickening of the left paratracheal stripe.
IMPRESSION: Low lung volumes with apparent new streaky bibasilar airspace
opacities raising suspicion for a developing infiltrate or
atelectasis.

## 2021-05-12 DIAGNOSIS — T1501XA Foreign body in cornea, right eye, initial encounter: Secondary | ICD-10-CM | POA: Diagnosis not present

## 2021-05-13 DIAGNOSIS — T1501XD Foreign body in cornea, right eye, subsequent encounter: Secondary | ICD-10-CM | POA: Diagnosis not present

## 2021-05-20 DIAGNOSIS — Z0000 Encounter for general adult medical examination without abnormal findings: Secondary | ICD-10-CM | POA: Diagnosis not present

## 2021-05-20 DIAGNOSIS — K219 Gastro-esophageal reflux disease without esophagitis: Secondary | ICD-10-CM | POA: Diagnosis not present

## 2021-05-20 DIAGNOSIS — I25119 Atherosclerotic heart disease of native coronary artery with unspecified angina pectoris: Secondary | ICD-10-CM | POA: Diagnosis not present

## 2021-05-20 DIAGNOSIS — Z87442 Personal history of urinary calculi: Secondary | ICD-10-CM | POA: Diagnosis not present

## 2021-05-20 DIAGNOSIS — M109 Gout, unspecified: Secondary | ICD-10-CM | POA: Diagnosis not present

## 2021-05-20 DIAGNOSIS — E7800 Pure hypercholesterolemia, unspecified: Secondary | ICD-10-CM | POA: Diagnosis not present

## 2021-05-20 DIAGNOSIS — Z23 Encounter for immunization: Secondary | ICD-10-CM | POA: Diagnosis not present

## 2021-05-20 DIAGNOSIS — I119 Hypertensive heart disease without heart failure: Secondary | ICD-10-CM | POA: Diagnosis not present

## 2021-06-08 DIAGNOSIS — D3131 Benign neoplasm of right choroid: Secondary | ICD-10-CM | POA: Diagnosis not present

## 2021-06-08 DIAGNOSIS — Z961 Presence of intraocular lens: Secondary | ICD-10-CM | POA: Diagnosis not present

## 2021-06-13 ENCOUNTER — Telehealth: Payer: PPO | Admitting: Nurse Practitioner

## 2021-06-13 DIAGNOSIS — J019 Acute sinusitis, unspecified: Secondary | ICD-10-CM

## 2021-06-13 DIAGNOSIS — B9689 Other specified bacterial agents as the cause of diseases classified elsewhere: Secondary | ICD-10-CM | POA: Diagnosis not present

## 2021-06-13 MED ORDER — AZELASTINE-FLUTICASONE 137-50 MCG/ACT NA SUSP: 1.0000 | Freq: Two times a day (BID) | NASAL | 0 refills | Status: DC

## 2021-06-13 MED ORDER — DOXYCYCLINE HYCLATE 100 MG PO TABS: 100.0000 mg | ORAL_TABLET | Freq: Two times a day (BID) | ORAL | 0 refills | Status: AC

## 2021-06-13 NOTE — Progress Notes (Signed)
I have spent 5 minutes in review of e-visit questionnaire, review and updating patient chart, medical decision making and response to patient.  ° °Randall Lopez [name], NP ° °  °

## 2021-06-13 NOTE — Progress Notes (Signed)
E-Visit for Sinus Problems  We are sorry that you are not feeling well.  Here is how we plan to help!  Based on what you have shared with me it looks like you have sinusitis.  Sinusitis is inflammation and infection in the sinus cavities of the head.  Based on your presentation I believe you most likely have Acute Bacterial Sinusitis.  This is an infection caused by bacteria and is treated with antibiotics. I have prescribed Doxycycline 100mg  by mouth twice a day for 10 days. As well as a nose spray. You may use an oral decongestant such as Mucinex D or if you have glaucoma or high blood pressure use plain Mucinex. Saline nasal spray help and can safely be used as often as needed for congestion.  If you develop worsening sinus pain, fever or notice severe headache and vision changes, or if symptoms are not better after completion of antibiotic, please schedule an appointment with a health care provider.    Sinus infections are not as easily transmitted as other respiratory infection, however we still recommend that you avoid close contact with loved ones, especially the very young and elderly.  Remember to wash your hands thoroughly throughout the day as this is the number one way to prevent the spread of infection!  Home Care: Only take medications as instructed by your medical team. Complete the entire course of an antibiotic. Do not take these medications with alcohol. A steam or ultrasonic humidifier can help congestion.  You can place a towel over your head and breathe in the steam from hot water coming from a faucet. Avoid close contacts especially the very young and the elderly. Cover your mouth when you cough or sneeze. Always remember to wash your hands.  Get Help Right Away If: You develop worsening fever or sinus pain. You develop a severe head ache or visual changes. Your symptoms persist after you have completed your treatment plan.  Make sure you Understand these  instructions. Will watch your condition. Will get help right away if you are not doing well or get worse.  Thank you for choosing an e-visit.  Your e-visit answers were reviewed by a board certified advanced clinical practitioner to complete your personal care plan. Depending upon the condition, your plan could have included both over the counter or prescription medications.  Please review your pharmacy choice. Make sure the pharmacy is open so you can pick up prescription now. If there is a problem, you may contact your provider through [employer] and have the prescription routed to another pharmacy.  Your safety is important to [place]. If you have drug allergies check your prescription carefully.   For the next 24 hours you can use MyChart to ask questions about today's visit, request a non-urgent call back, or ask for a work or school excuse. You will get an email in the next two days asking about your experience. I hope that your e-visit has been valuable and will speed your recovery.

## 2021-06-25 ENCOUNTER — Other Ambulatory Visit: Payer: Self-pay | Admitting: Interventional Cardiology

## 2021-06-29 ENCOUNTER — Telehealth: Payer: PPO | Admitting: Emergency Medicine

## 2021-06-29 DIAGNOSIS — J069 Acute upper respiratory infection, unspecified: Secondary | ICD-10-CM

## 2021-06-29 NOTE — Progress Notes (Signed)
E-Visit for Upper Respiratory Infection   We are sorry you are not feeling well.  Here is how we plan to help!  Sounds like the initial symptoms cleared.  I wouldn't start you back on any additional antibiotics unless you start having persistent fevers greater than 100.3 degrees.  Based on what you have shared with me, it looks like you may have a viral upper respiratory infection.  Upper respiratory infections are caused by a large number of viruses; however, rhinovirus is the most common cause.   Symptoms vary from person to person, with common symptoms including sore throat, cough, fatigue or lack of energy and feeling of general discomfort.  A low-grade fever of up to 100.4 may present, but is often uncommon.  Symptoms vary however, and are closely related to a person's age or underlying illnesses.  The most common symptoms associated with an upper respiratory infection are nasal discharge or congestion, cough, sneezing, headache and pressure in the ears and face.  These symptoms usually persist for about 3 to 10 days, but can last up to 2 weeks.  It is important to know that upper respiratory infections do not cause serious illness or complications in most cases.    Upper respiratory infections can be transmitted from person to person, with the most common method of transmission being a person's hands.  The virus is able to live on the skin and can infect other persons for up to 2 hours after direct contact.  Also, these can be transmitted when someone coughs or sneezes; thus, it is important to cover the mouth to reduce this risk.  To keep the spread of the illness at [place], good hand hygiene is very important.  This is an infection that is most likely caused by a virus. There are no specific treatments other than to help you with the symptoms until the infection runs its course.  We are sorry you are not feeling well.  Here is how we plan to help!   For nasal congestion, you may use an oral  decongestants such as Mucinex D or if you have glaucoma or high blood pressure use plain Mucinex.  Saline nasal spray or nasal drops can help and can safely be used as often as needed for congestion.  For your congestion, I have prescribed Fluticasone nasal spray one spray in each nostril twice a day  If you do not have a history of heart disease, hypertension, diabetes or thyroid disease, prostate/bladder issues or glaucoma, you may also use Sudafed to treat nasal congestion.  It is highly recommended that you consult with a pharmacist or your primary care physician to ensure this medication is safe for you to take.     If you have a cough, you may use cough suppressants such as Delsym and Robitussin.  If you have glaucoma or high blood pressure, you can also use Coricidin HBP.    If you have a sore or scratchy throat, use a saltwater gargle-  to  teaspoon of salt dissolved in a 4-ounce to 8-ounce glass of warm water.  Gargle the solution for approximately 15-30 seconds and then spit.  It is important not to swallow the solution.  You can also use throat lozenges/cough drops and Chloraseptic spray to help with throat pain or discomfort.  Warm or cold liquids can also be helpful in relieving throat pain.  For headache, pain or general discomfort, you can use Ibuprofen or Tylenol as directed.   Some authorities believe that  zinc sprays or the use of Echinacea may shorten the course of your symptoms.   HOME CARE Only take medications as instructed by your medical team. Be sure to drink plenty of fluids. Water is fine as well as fruit juices, sodas and electrolyte beverages. You may want to stay away from caffeine or alcohol. If you are nauseated, try taking small sips of liquids. How do you know if you are getting enough fluid? Your urine should be a pale yellow or almost colorless. Get rest. Taking a steamy shower or using a humidifier may help nasal congestion and ease sore throat pain. You can  place a towel over your head and breathe in the steam from hot water coming from a faucet. Using a saline nasal spray works much the same way. Cough drops, hard candies and sore throat lozenges may ease your cough. Avoid close contacts especially the very young and the elderly Cover your mouth if you cough or sneeze Always remember to wash your hands.   GET HELP RIGHT AWAY IF: You develop worsening fever. If your symptoms do not improve within 10 days You develop yellow or green discharge from your nose over 3 days. You have coughing fits You develop a severe head ache or visual changes. You develop shortness of breath, difficulty breathing or start having chest pain Your symptoms persist after you have completed your treatment plan  MAKE SURE YOU  Understand these instructions. Will watch your condition. Will get help right away if you are not doing well or get worse.  Thank you for choosing an e-visit.  Your e-visit answers were reviewed by a board certified advanced clinical practitioner to complete your personal care plan. Depending upon the condition, your plan could have included both over the counter or prescription medications.  Please review your pharmacy choice. Make sure the pharmacy is open so you can pick up prescription now. If there is a problem, you may contact your provider through [employer] and have the prescription routed to another pharmacy.  Your safety is important to [place]. If you have drug allergies check your prescription carefully.   For the next 24 hours you can use MyChart to ask questions about today's visit, request a non-urgent call back, or ask for a work or school excuse. You will get an email in the next two days asking about your experience. I hope that your e-visit has been valuable and will speed your recovery.    Approximately 5 minutes was used in reviewing the patient's chart, questionnaire, prescribing medications, and documentation.

## 2021-07-10 ENCOUNTER — Other Ambulatory Visit: Payer: Self-pay | Admitting: Interventional Cardiology

## 2021-07-29 ENCOUNTER — Other Ambulatory Visit: Payer: Self-pay

## 2021-07-29 MED ORDER — ATORVASTATIN CALCIUM 80 MG PO TABS: 80.0000 mg | ORAL_TABLET | Freq: Every day | ORAL | 1 refills | Status: DC

## 2021-09-03 ENCOUNTER — Ambulatory Visit: Payer: BC Managed Care – PPO | Admitting: Interventional Cardiology

## 2021-09-24 ENCOUNTER — Other Ambulatory Visit: Payer: Self-pay | Admitting: Interventional Cardiology

## 2021-11-03 NOTE — Progress Notes (Signed)
?Cardiology Office Note:   ? ?Date:  11/04/2021  ? ?ID:  Randall Lopez, DOB [birth year], MRN [id number] ? ?PCP:  Mayra Neer, MD  ?Cardiologist:  Sinclair Grooms, MD  ? ?Referring MD: Mayra Neer, MD  ? ?Chief Complaint  ?Patient presents with  ? Coronary Artery Disease  ? Hypertension  ? Hyperlipidemia  ? ? ?History of Present Illness:   ? ?Randall Lopez is a 76 y.o. male with a hx of GERD, gout, HTN, PACs, non-ST elevation MI with drug-eluting stent implantation in first diagonal, mid LAD, and right coronary December 2020. ?  ? ? ?He is doing well.  Very active.  No angina.  Had multi stent PCI in 2020.  No nitroglycerin use.  Denies dyspnea.  No medication side effects. ? ?Past Medical History:  ?Diagnosis Date  ? Cough   ? low grade fever  ? Enlarged prostate   ? GERD (gastroesophageal reflux disease)   ? takes Omeprazole daily  ? Gout   ? hx of,not on any meds  ? History of kidney stones   ? x3 -pass 1, litho x2  ? Hypertension   ? takes Atenolol daily  ? Sinus infection   ? completed z pak 12/16/15.  ? Tachycardia   ? ? ?Past Surgical History:  ?Procedure Laterality Date  ? ANTERIOR CRUCIATE LIGAMENT REPAIR Left 12/23/2015  ? Procedure: LEFT KNEE DIAGNOSTIC OPERATIVE ARTHROSCOPY, DEBRIDEMENT, RECONSTRUCTION ANTERIOR CRUCIATE LIGAMENT (ACL) WITH HAMSTRING GRAFT;  Surgeon: Meredith Pel, MD;  Location: [facility];  Service: Orthopedics;  Laterality: Left;  LEFT KNEE DIAGNOSTIC OPERATIVE ARHTROSCOPY, DEBRIDEMENT, ANTERIOR CRUCIATE LIGAMENT RECONSTRUCTION IWTH HAMSTRING AUTOGRAFT.  ? BACK SURGERY    ? CATARACT EXTRACTION, BILATERAL    ? last 09-14-14  ? CHOLECYSTECTOMY    ? COLONOSCOPY W/ POLYPECTOMY    ? past hx. polyps-benign  ? COLONOSCOPY WITH PROPOFOL N/A 10/14/2014  ? Procedure: COLONOSCOPY WITH PROPOFOL;  Surgeon: Garlan Fair, MD;  Location: [facility];  Service: Endoscopy;  Laterality: N/A;  ? CORONARY STENT INTERVENTION N/A 07/19/2019  ? Procedure: CORONARY STENT INTERVENTION;  Surgeon:  Jettie Booze, MD;  Location: [facility];  Service: Cardiovascular;  Laterality: N/A;  ? CORONARY THROMBECTOMY N/A 07/19/2019  ? Procedure: Coronary Thrombectomy;  Surgeon: Jettie Booze, MD;  Location: [facility];  Service: Cardiovascular;  Laterality: N/A;  ? INTRAVASCULAR PRESSURE WIRE/FFR STUDY N/A 07/19/2019  ? Procedure: INTRAVASCULAR PRESSURE WIRE/FFR STUDY;  Surgeon: Jettie Booze, MD;  Location: [facility];  Service: Cardiovascular;  Laterality: N/A;  ? LAPAROSCOPIC CHOLECYSTECTOMY SINGLE PORT  08/04/2012  ? Procedure: LAPAROSCOPIC CHOLECYSTECTOMY SINGLE PORT;  Surgeon: Adin Hector, MD;  Location: [facility];  Service: General;  Laterality: N/A;  LYSIS OF ADHESIONS with Intraoperative Cholangiogram   ? LEFT HEART CATH AND CORONARY ANGIOGRAPHY N/A 07/19/2019  ? Procedure: LEFT HEART CATH AND CORONARY ANGIOGRAPHY;  Surgeon: Jettie Booze, MD;  Location: [facility];  Service: Cardiovascular;  Laterality: N/A;  ? LITHOTRIPSY    ? LUMBAR LAMINECTOMY/DECOMPRESSION MICRODISCECTOMY  12/17/2011  ? Procedure: LUMBAR LAMINECTOMY/DECOMPRESSION MICRODISCECTOMY;  Surgeon: Melina Schools, MD;  Location: [facility];  Service: Orthopedics;  Laterality: Right;  L4-L5 Microdiscectomy  ? NASAL SEPTOPLASTY W/ TURBINOPLASTY Bilateral 01/26/2019  ? Procedure: NASAL SEPTOPLASTY WITH BILATERAL TURBINATE REDUCTION;  Surgeon: Leta Baptist, MD;  Location: [facility];  Service: ENT;  Laterality: Bilateral;  ? TONSILLECTOMY    ? ? ?Current Medications: ?Current Meds  ?Medication  Sig  ? atorvastatin (LIPITOR) 80 MG tablet Take 1 tablet (80 mg total) by mouth daily at 6 PM. Please keep upcoming appt in April 2023 with Dr. Tamala Julian before anymore refills. Thank you  ? carvedilol (COREG) 12.5 MG tablet Take 1.5 tablets (18.75 mg total) by mouth 2 (two) times daily with a meal. Please keep upcoming appt in April 2023 with Dr. Tamala Julian before anymore refills. Thank you final Attempt  ?  cetirizine (ZYRTEC) 10 MG tablet Take 10 mg by mouth at bedtime.   ? clopidogrel (PLAVIX) 75 MG tablet TAKE 1 TABLET BY MOUTH EVERY DAY  ? Melatonin 10 MG TABS Take 1 tablet by mouth at bedtime.  ? nitroGLYCERIN (NITROSTAT) 0.4 MG SL tablet Place 1 tablet (0.4 mg total) under the tongue every 5 (five) minutes x 3 doses as needed for chest pain.  ? pantoprazole (PROTONIX) 40 MG tablet TAKE 1 TABLET BY MOUTH EVERY DAY. STOP TAKING OMEPRAZOLE (PRILOSEC)  ? Potassium Citrate 15 MEQ (1620 MG) TBCR Take 1 tablet by mouth daily.  ?  ? ?Allergies:   Fluticasone  ? ?Social History  ? ?Socioeconomic History  ? Marital status: Married  ?  Spouse name: Not on file  ? Number of children: Not on file  ? Years of education: Not on file  ? Highest education level: Not on file  ?Occupational History  ? Not on file  ?Tobacco Use  ? Smoking status: Never  ? Smokeless tobacco: Never  ?Substance and Sexual Activity  ? Alcohol use: No  ? Drug use: No  ? Sexual activity: Yes  ?Other Topics Concern  ? Not on file  ?Social History Narrative  ? Not on file  ? ?Social Determinants of Health  ? ?Financial Resource Strain: Not on file  ?Food Insecurity: Not on file  ?Transportation Needs: Not on file  ?Physical Activity: Not on file  ?Stress: Not on file  ?Social Connections: Not on file  ?  ? ?Family History: ?The patient's family history includes COPD in his mother; Cancer in his maternal grandfather; Diabetes in his mother; Heart disease in his maternal uncle; Hypertension in his mother; Stroke in his father and maternal grandmother. ? ?ROS:   ?Please see the history of present illness.    ?Very active.  Gets more than 150 minutes of activity per week.  No limitations.  Sleeps well.  All other systems reviewed and are negative. ? ?EKGs/Labs/Other Studies Reviewed:   ? ?The following studies were reviewed today: ?MULTIVESSEL PCI 2020: ?Diagnostic ?Dominance: Right ?Intervention ? ? ? ?EKG:  EKG normal sinus rhythm with normal  appearance. ? ?Recent Labs: ?No results found for requested labs within last 8760 hours.  ?Recent Lipid Panel ?   ?Component Value Date/Time  ? CHOL 88 (L) 09/11/2020 8469  ? TRIG 116 09/11/2020 0822  ? HDL 31 (L) 09/11/2020 6295  ? CHOLHDL 2.8 09/11/2020 2841  ? CHOLHDL 4.3 07/19/2019 0256  ? VLDL 23 07/19/2019 0256  ? [place] 36 09/11/2020 0822  ? ? ?Physical Exam:   ? ?VS:  BP 138/74   Pulse 62   Ht 5' 9.5" (1.765 m)   Wt 210 lb (95.3 kg)   SpO2 97%   BMI 30.57 kg/m?    ? ?Wt Readings from Last 3 Encounters:  ?11/04/21 210 lb (95.3 kg)  ?05/12/20 206 lb (93.4 kg)  ?11/14/19 205 lb 12.8 oz (93.4 kg)  ?  ? ?GEN: Overweight. No acute distress ?HEENT: Normal ?NECK: No JVD. ?LYMPHATICS: No  lymphadenopathy ?CARDIAC: No murmur. RRR no gallop, or edema. ?VASCULAR:  Normal Pulses. No bruits. ?RESPIRATORY:  Clear to auscultation without rales, wheezing or rhonchi  ?ABDOMEN: Soft, non-tender, non-distended, No pulsatile mass, ?MUSCULOSKELETAL: No deformity  ?SKIN: Warm and dry ?NEUROLOGIC:  Alert and oriented x 3 ?PSYCHIATRIC:  Normal affect  ? ?ASSESSMENT:   ? ?1. Coronary artery disease involving native coronary artery of native heart without angina pectoris   ?2. Pure hypercholesterolemia   ?3. Essential hypertension   ?4. Palpitations   ? ?PLAN:   ? ?In order of problems listed above: ? ?Secondary prevention reviewed.  Encourage physical activity. ?Continue high intensity statin therapy.  Most recent LDL was 41.  No change in therapy. ?Blood pressure control is excellent on current therapy, carvedilol 12.5 mg twice daily. ?Denies any recurrence\ ? ?Overall education and awareness of secondary risk prevention was discussed in detail: LDL less than 70, hemoglobin A1c less than 7, blood pressure target less than 130/80 mmHg, >150 minutes of moderate aerobic activity per week, avoidance of smoking, weight control (via diet and exercise), and continued surveillance/management of/for obstructive sleep  apnea. ? ? ? ?Medication Adjustments/Labs and Tests Ordered: ?Current medicines are reviewed at length with the patient today.  Concerns regarding medicines are outlined above.  ?Orders Placed This Encounter  ?Procedures  ? EKG 12-Lead  ? ?No orde

## 2021-11-04 ENCOUNTER — Encounter: Payer: Self-pay | Admitting: Interventional Cardiology

## 2021-11-04 ENCOUNTER — Ambulatory Visit (INDEPENDENT_AMBULATORY_CARE_PROVIDER_SITE_OTHER): Payer: PPO | Admitting: Interventional Cardiology

## 2021-11-04 VITALS — BP 138/74 | HR 62 | Ht 69.5 in | Wt 210.0 lb

## 2021-11-04 DIAGNOSIS — I10 Essential (primary) hypertension: Secondary | ICD-10-CM | POA: Diagnosis not present

## 2021-11-04 DIAGNOSIS — E7800 Pure hypercholesterolemia, unspecified: Secondary | ICD-10-CM | POA: Diagnosis not present

## 2021-11-04 DIAGNOSIS — I2510 Atherosclerotic heart disease of native coronary artery without angina pectoris: Secondary | ICD-10-CM | POA: Diagnosis not present

## 2021-11-04 DIAGNOSIS — R002 Palpitations: Secondary | ICD-10-CM | POA: Diagnosis not present

## 2021-11-04 NOTE — Patient Instructions (Signed)

## 2021-12-21 ENCOUNTER — Other Ambulatory Visit: Payer: Self-pay | Admitting: Interventional Cardiology

## 2021-12-24 ENCOUNTER — Other Ambulatory Visit: Payer: Self-pay | Admitting: Interventional Cardiology

## 2022-01-06 ENCOUNTER — Other Ambulatory Visit: Payer: Self-pay | Admitting: Interventional Cardiology

## 2022-01-22 ENCOUNTER — Other Ambulatory Visit: Payer: Self-pay | Admitting: Interventional Cardiology

## 2022-02-07 DIAGNOSIS — S70262A Insect bite (nonvenomous), left hip, initial encounter: Secondary | ICD-10-CM | POA: Diagnosis not present

## 2022-02-07 DIAGNOSIS — W57XXXA Bitten or stung by nonvenomous insect and other nonvenomous arthropods, initial encounter: Secondary | ICD-10-CM | POA: Diagnosis not present

## 2022-02-24 ENCOUNTER — Other Ambulatory Visit: Payer: Self-pay | Admitting: Interventional Cardiology

## 2022-03-23 DIAGNOSIS — M722 Plantar fascial fibromatosis: Secondary | ICD-10-CM | POA: Diagnosis not present

## 2022-04-27 ENCOUNTER — Ambulatory Visit (INDEPENDENT_AMBULATORY_CARE_PROVIDER_SITE_OTHER): Payer: PPO | Admitting: Podiatry

## 2022-04-27 DIAGNOSIS — M722 Plantar fascial fibromatosis: Secondary | ICD-10-CM | POA: Diagnosis not present

## 2022-05-01 NOTE — Progress Notes (Signed)
Subjective:  Patient ID: Randall Lopez, male    DOB: November 09, 1945,  MRN: [id number]  Chief Complaint  Patient presents with   Plantar Fasciitis    76 y.o. male presents with the above complaint.  Patient presents with left heel pain that has been on for quite some time is progressive gotten worse worse with ambulation worse with pressure.  He went to get it evaluated he has had history of Planter fasciitis.  He has not seen and was prior to seeing me.  He would like to have it treated.  Pain scale 6 out of 10 hurts with ambulation hurts with pressure.   Review of Systems: Negative except as noted in the HPI. Denies N/V/F/Ch.  Past Medical History:  Diagnosis Date   Cough    low grade fever   Enlarged prostate    GERD (gastroesophageal reflux disease)    takes Omeprazole daily   Gout    hx of,not on any meds   History of kidney stones    x3 -pass 1, litho x2   Hypertension    takes Atenolol daily   Sinus infection    completed z pak 12/16/15.   Tachycardia     Current Outpatient Medications:    atorvastatin (LIPITOR) 80 MG tablet, Take 1 tablet (80 mg total) by mouth daily at 6 PM., Disp: 90 tablet, Rfl: 3   carvedilol (COREG) 12.5 MG tablet, Take 1.5 tablets (18.75 mg total) by mouth 2 (two) times daily with a meal., Disp: 270 tablet, Rfl: 2   cetirizine (ZYRTEC) 10 MG tablet, Take 10 mg by mouth at bedtime. , Disp: , Rfl:    clopidogrel (PLAVIX) 75 MG tablet, TAKE 1 TABLET BY MOUTH EVERY DAY, Disp: 90 tablet, Rfl: 3   Melatonin 10 MG TABS, Take 1 tablet by mouth at bedtime., Disp: , Rfl:    nitroGLYCERIN (NITROSTAT) 0.4 MG SL tablet, PLACE 1 TABLET (0.4 MG TOTAL) UNDER THE TONGUE EVERY 5 MINUTES X 3 DOSES AS NEEDED FOR CHEST PAIN., Disp: 25 tablet, Rfl: 3   pantoprazole (PROTONIX) 40 MG tablet, TAKE 1 TABLET BY MOUTH EVERY DAY. STOP TAKING OMEPRAZOLE (PRILOSEC), Disp: 90 tablet, Rfl: 3   Potassium Citrate 15 MEQ (1620 MG) TBCR, Take 1 tablet by mouth daily., Disp: , Rfl:    Social History   Tobacco Use  Smoking Status Never  Smokeless Tobacco Never    Allergies  Allergen Reactions   Fluticasone     Other reaction(s): not effective   Objective:  There were no vitals filed for this visit. There is no height or weight on file to calculate BMI. Constitutional Well developed. Well nourished.  Vascular Dorsalis pedis pulses palpable bilaterally. Posterior tibial pulses palpable bilaterally. Capillary refill normal to all digits.  No cyanosis or clubbing noted. Pedal hair growth normal.  Neurologic Normal speech. Oriented to person, place, and time. Epicritic sensation to light touch grossly present bilaterally.  Dermatologic Nails well groomed and normal in appearance. No open wounds. No skin lesions.  Orthopedic: Normal joint ROM without pain or crepitus bilaterally. No visible deformities. Tender to palpation at the calcaneal tuber left. No pain with calcaneal squeeze left. Ankle ROM diminished range of motion left. Silfverskiold Test: positive left.   Radiographs: None  Assessment:  No diagnosis found. Plan:  Patient was evaluated and treated and all questions answered.  Plantar Fasciitis, left - XR reviewed as above.  - Educated on icing and stretching. Instructions given.  - Injection delivered to the plantar  fascia as below. - DME: Plantar fascial brace dispensed to support the medial longitudinal arch of the foot and offload pressure from the heel and prevent arch collapse during weightbearing - Pharmacologic management: None  Pes planovalgus -I explained to patient the etiology of pes planovalgus and relationship with Planter fasciitis and various treatment options were discussed.  Given patient foot structure in the setting of Planter fasciitis I believe patient will benefit from custom-made orthotics to help control the hindfoot motion support the arch of the foot and take the stress away from plantar fascial.  Patient agrees  with the plan like to proceed with orthotics -Patient was casted for orthotics   Procedure: Injection Tendon/Ligament Location: Left plantar fascia at the glabrous junction; medial approach. Skin Prep: alcohol Injectate: 0.5 cc 0.5% marcaine plain, 0.5 cc of 1% Lidocaine, 0.5 cc kenalog 10. Disposition: Patient tolerated procedure well. Injection site dressed with a band-aid.  No follow-ups on file.

## 2022-05-25 DIAGNOSIS — Z0000 Encounter for general adult medical examination without abnormal findings: Secondary | ICD-10-CM | POA: Diagnosis not present

## 2022-05-25 DIAGNOSIS — E7800 Pure hypercholesterolemia, unspecified: Secondary | ICD-10-CM | POA: Diagnosis not present

## 2022-05-25 DIAGNOSIS — K219 Gastro-esophageal reflux disease without esophagitis: Secondary | ICD-10-CM | POA: Diagnosis not present

## 2022-05-25 DIAGNOSIS — M109 Gout, unspecified: Secondary | ICD-10-CM | POA: Diagnosis not present

## 2022-05-25 DIAGNOSIS — I119 Hypertensive heart disease without heart failure: Secondary | ICD-10-CM | POA: Diagnosis not present

## 2022-05-25 DIAGNOSIS — Z23 Encounter for immunization: Secondary | ICD-10-CM | POA: Diagnosis not present

## 2022-05-25 DIAGNOSIS — I25119 Atherosclerotic heart disease of native coronary artery with unspecified angina pectoris: Secondary | ICD-10-CM | POA: Diagnosis not present

## 2022-05-25 DIAGNOSIS — Z87442 Personal history of urinary calculi: Secondary | ICD-10-CM | POA: Diagnosis not present

## 2022-05-27 ENCOUNTER — Ambulatory Visit (INDEPENDENT_AMBULATORY_CARE_PROVIDER_SITE_OTHER): Payer: BC Managed Care – PPO | Admitting: Podiatry

## 2022-05-27 DIAGNOSIS — Q666 Other congenital valgus deformities of feet: Secondary | ICD-10-CM | POA: Diagnosis not present

## 2022-05-27 DIAGNOSIS — M722 Plantar fascial fibromatosis: Secondary | ICD-10-CM | POA: Diagnosis not present

## 2022-05-27 NOTE — Progress Notes (Signed)
Subjective:  Patient ID: Randall Lopez, male    DOB: Aug 17, 1945,  MRN: [id number]  Chief Complaint  Patient presents with   Plantar Fasciitis    76 y.o. male presents with the above complaint.  Patient presents for follow-up of left Planter fasciitis.  He states he is doing a lot better no pain.  He denies any other acute complaints he would like to discuss next treatment plan he is also going to pick up his orthotics today.   Review of Systems: Negative except as noted in the HPI. Denies N/V/F/Ch.  Past Medical History:  Diagnosis Date   Cough    low grade fever   Enlarged prostate    GERD (gastroesophageal reflux disease)    takes Omeprazole daily   Gout    hx of,not on any meds   History of kidney stones    x3 -pass 1, litho x2   Hypertension    takes Atenolol daily   Sinus infection    completed z pak 12/16/15.   Tachycardia     Current Outpatient Medications:    atorvastatin (LIPITOR) 80 MG tablet, Take 1 tablet (80 mg total) by mouth daily at 6 PM., Disp: 90 tablet, Rfl: 3   carvedilol (COREG) 12.5 MG tablet, Take 1.5 tablets (18.75 mg total) by mouth 2 (two) times daily with a meal., Disp: 270 tablet, Rfl: 2   cetirizine (ZYRTEC) 10 MG tablet, Take 10 mg by mouth at bedtime. , Disp: , Rfl:    clopidogrel (PLAVIX) 75 MG tablet, TAKE 1 TABLET BY MOUTH EVERY DAY, Disp: 90 tablet, Rfl: 3   Melatonin 10 MG TABS, Take 1 tablet by mouth at bedtime., Disp: , Rfl:    nitroGLYCERIN (NITROSTAT) 0.4 MG SL tablet, PLACE 1 TABLET (0.4 MG TOTAL) UNDER THE TONGUE EVERY 5 MINUTES X 3 DOSES AS NEEDED FOR CHEST PAIN., Disp: 25 tablet, Rfl: 3   pantoprazole (PROTONIX) 40 MG tablet, TAKE 1 TABLET BY MOUTH EVERY DAY. STOP TAKING OMEPRAZOLE (PRILOSEC), Disp: 90 tablet, Rfl: 3   Potassium Citrate 15 MEQ (1620 MG) TBCR, Take 1 tablet by mouth daily., Disp: , Rfl:   Social History   Tobacco Use  Smoking Status Never  Smokeless Tobacco Never    Allergies  Allergen Reactions    Fluticasone     Other reaction(s): not effective   Objective:  There were no vitals filed for this visit. There is no height or weight on file to calculate BMI. Constitutional Well developed. Well nourished.  Vascular Dorsalis pedis pulses palpable bilaterally. Posterior tibial pulses palpable bilaterally. Capillary refill normal to all digits.  No cyanosis or clubbing noted. Pedal hair growth normal.  Neurologic Normal speech. Oriented to person, place, and time. Epicritic sensation to light touch grossly present bilaterally.  Dermatologic Nails well groomed and normal in appearance. No open wounds. No skin lesions.  Orthopedic: Normal joint ROM without pain or crepitus bilaterally. No visible deformities. No further tender to palpation at the calcaneal tuber left. No pain with calcaneal squeeze left. Ankle ROM diminished range of motion left. Silfverskiold Test: positive left.   Radiographs: None  Assessment:   1. Plantar fasciitis of left foot   2. Pes planovalgus    Plan:  Patient was evaluated and treated and all questions answered.  Plantar Fasciitis, left -Clinic healed no further injection indicated.  Patient here with 1 injection.  I discussed shoe gear modification and orthotics management extensive detail he states understanding and if it reoccurs he will come  back and see me.  Pes planovalgus -I explained to patient the etiology of pes planovalgus and relationship with Planter fasciitis and various treatment options were discussed.  Given patient foot structure in the setting of Planter fasciitis I believe patient will benefit from custom-made orthotics to help control the hindfoot motion support the arch of the foot and take the stress away from plantar fascial.  Patient agrees with the plan like to proceed with orthotics -Orthotics were dispensed    No follow-ups on file.

## 2022-06-14 DIAGNOSIS — Z961 Presence of intraocular lens: Secondary | ICD-10-CM | POA: Diagnosis not present

## 2022-06-14 DIAGNOSIS — D3131 Benign neoplasm of right choroid: Secondary | ICD-10-CM | POA: Diagnosis not present

## 2022-08-30 DIAGNOSIS — N200 Calculus of kidney: Secondary | ICD-10-CM | POA: Diagnosis not present

## 2022-10-12 ENCOUNTER — Telehealth: Payer: Self-pay | Admitting: Cardiology

## 2022-10-12 NOTE — Telephone Encounter (Signed)
Patient c/o Palpitations:  High priority if patient c/o lightheadedness, shortness of breath, or chest pain  How long have you had palpitations/irregular HR/ Afib? Are you having the symptoms now? 2 weeks ago.   Are you currently experiencing lightheadedness, SOB or CP? No   Do you have a history of afib (atrial fibrillation) or irregular heart rhythm? Yes   Have you checked your BP or HR? (document readings if available): 128/75 HR 68  Are you experiencing any other symptoms? No, just states he has flutters that have gotten worse.

## 2022-10-12 NOTE — Telephone Encounter (Signed)
Return call to patient. Patient states he started noticing some fluttering or intermittent palpitations a few weeks ago. He denies chest pain, SOB, NV or other symptoms. He states his potassium was checked recently by PCP and it was 4.2.    Patient has an appointment scheduled on 11/05/2022. Provided education on things that can cause palpations such as caffeine and cold medications. Advised if the symptoms worsen, he develops chest pain, SOB or N/V to go to the ED for evaluation. Patient verbalized understanding and had no questions.

## 2022-10-12 NOTE — Telephone Encounter (Signed)
Patient returning call.

## 2022-10-12 NOTE — Telephone Encounter (Signed)
Left message to call the clinic. 

## 2022-10-22 ENCOUNTER — Other Ambulatory Visit: Payer: Self-pay

## 2022-10-22 MED ORDER — PANTOPRAZOLE SODIUM 40 MG PO TBEC: DELAYED_RELEASE_TABLET | ORAL | 0 refills | Status: DC

## 2022-11-04 NOTE — Progress Notes (Signed)
Cardiology Office Note:    Date:  11/05/2022   ID:  Randall Lopez, DOB [birth year], MRN [id number]  PCP:  Lupita Raider, MD   [facility] Providers Cardiologist:  Donato Schultz, MD     Referring MD: Lupita Raider, MD    History of Present Illness:    Randall Lopez is a 77 y.o. male former patient of Dr. Sherilyn Cooter Smith's here for follow up CAD with prior NSTEMI with DES to D1, mid LAD, RCA in 07/2019.   Doing well without angina, no fevers chills nausea vomiting.  Has started noticing some fluttering or intermittent palpitations a few weeks ago. He denies chest pain, SOB, NV or other symptoms. He states his potassium was checked recently by PCP and it was 4.2. PAC's felt like this. Went off caffiene. Feels better.   Past Medical History:  Diagnosis Date   Cough    low grade fever   Enlarged prostate    GERD (gastroesophageal reflux disease)    takes Omeprazole daily   Gout    hx of,not on any meds   History of kidney stones    x3 -pass 1, litho x2   Hypertension    takes Atenolol daily   Sinus infection    completed z pak 12/16/15.   Tachycardia     Past Surgical History:  Procedure Laterality Date   ANTERIOR CRUCIATE LIGAMENT REPAIR Left 12/23/2015   Procedure: LEFT KNEE DIAGNOSTIC OPERATIVE ARTHROSCOPY, DEBRIDEMENT, RECONSTRUCTION ANTERIOR CRUCIATE LIGAMENT (ACL) WITH HAMSTRING GRAFT;  Surgeon: Cammy Copa, MD;  Location: [facility] OR;  Service: Orthopedics;  Laterality: Left;  LEFT KNEE DIAGNOSTIC OPERATIVE ARHTROSCOPY, DEBRIDEMENT, ANTERIOR CRUCIATE LIGAMENT RECONSTRUCTION IWTH HAMSTRING AUTOGRAFT.   BACK SURGERY     CATARACT EXTRACTION, BILATERAL     last 09-14-14   CHOLECYSTECTOMY     COLONOSCOPY W/ POLYPECTOMY     past hx. polyps-benign   COLONOSCOPY WITH PROPOFOL N/A 10/14/2014   Procedure: COLONOSCOPY WITH PROPOFOL;  Surgeon: Charolett Bumpers, MD;  Location: [facility];  Service: Endoscopy;  Laterality: N/A;   CORONARY PRESSURE/FFR STUDY N/A  07/19/2019   Procedure: INTRAVASCULAR PRESSURE WIRE/FFR STUDY;  Surgeon: Corky Crafts, MD;  Location: [facility] INVASIVE CV LAB;  Service: Cardiovascular;  Laterality: N/A;   CORONARY STENT INTERVENTION N/A 07/19/2019   Procedure: CORONARY STENT INTERVENTION;  Surgeon: Corky Crafts, MD;  Location: [facility] INVASIVE CV LAB;  Service: Cardiovascular;  Laterality: N/A;   CORONARY THROMBECTOMY N/A 07/19/2019   Procedure: Coronary Thrombectomy;  Surgeon: Corky Crafts, MD;  Location: [facility] INVASIVE CV LAB;  Service: Cardiovascular;  Laterality: N/A;   LAPAROSCOPIC CHOLECYSTECTOMY SINGLE PORT  08/04/2012   Procedure: LAPAROSCOPIC CHOLECYSTECTOMY SINGLE PORT;  Surgeon: Ardeth Sportsman, MD;  Location: [facility] ORS;  Service: General;  Laterality: N/A;  LYSIS OF ADHESIONS with Intraoperative Cholangiogram    LEFT HEART CATH AND CORONARY ANGIOGRAPHY N/A 07/19/2019   Procedure: LEFT HEART CATH AND CORONARY ANGIOGRAPHY;  Surgeon: Corky Crafts, MD;  Location: [facility] INVASIVE CV LAB;  Service: Cardiovascular;  Laterality: N/A;   LITHOTRIPSY     LUMBAR LAMINECTOMY/DECOMPRESSION MICRODISCECTOMY  12/17/2011   Procedure: LUMBAR LAMINECTOMY/DECOMPRESSION MICRODISCECTOMY;  Surgeon: Venita Lick, MD;  Location: [facility] ORS;  Service: Orthopedics;  Laterality: Right;  L4-L5 Microdiscectomy   NASAL SEPTOPLASTY W/ TURBINOPLASTY Bilateral 01/26/2019   Procedure: NASAL SEPTOPLASTY WITH BILATERAL TURBINATE REDUCTION;  Surgeon: Newman Pies, MD;  Location: [facility];  Service: ENT;  Laterality: Bilateral;   TONSILLECTOMY  Current Medications: Current Meds  Medication Sig   atorvastatin (LIPITOR) 80 MG tablet Take 1 tablet (80 mg total) by mouth daily at 6 PM.   carvedilol (COREG) 12.5 MG tablet Take 1.5 tablets (18.75 mg total) by mouth 2 (two) times daily with a meal.   cetirizine (ZYRTEC) 10 MG tablet Take 10 mg by mouth at bedtime.    clopidogrel (PLAVIX) 75 MG tablet TAKE 1 TABLET BY MOUTH EVERY DAY    Melatonin 10 MG TABS Take 1 tablet by mouth at bedtime.   nitroGLYCERIN (NITROSTAT) 0.4 MG SL tablet PLACE 1 TABLET (0.4 MG TOTAL) UNDER THE TONGUE EVERY 5 MINUTES X 3 DOSES AS NEEDED FOR CHEST PAIN.   pantoprazole (PROTONIX) 40 MG tablet TAKE 1 TABLET BY MOUTH EVERY DAY. STOP TAKING OMEPRAZOLE (PRILOSEC)   Potassium Citrate 15 MEQ (1620 MG) TBCR Take 1 tablet by mouth daily.     Allergies:   Fluticasone   Social History   Socioeconomic History   Marital status: Married    Spouse name: Not on file   Number of children: Not on file   Years of education: Not on file   Highest education level: Not on file  Occupational History   Not on file  Tobacco Use   Smoking status: Never   Smokeless tobacco: Never  Substance and Sexual Activity   Alcohol use: No   Drug use: No   Sexual activity: Yes  Other Topics Concern   Not on file  Social History Narrative   Not on file   Social Determinants of Health   Financial Resource Strain: Not on file  Food Insecurity: Not on file  Transportation Needs: Not on file  Physical Activity: Not on file  Stress: Not on file  Social Connections: Not on file     Family History: The patient's family history includes COPD in his mother; Cancer in his maternal grandfather; Diabetes in his mother; Heart disease in his maternal uncle; Hypertension in his mother; Stroke in his father and maternal grandmother.  ROS:   Please see the history of present illness.     All other systems reviewed and are negative.  EKGs/Labs/Other Studies Reviewed:    The following studies were reviewed today: MULTIVESSEL PCI 2020: Diagnostic Dominance: Right Intervention     Cardiac Studies & Procedures   CARDIAC CATHETERIZATION  CARDIAC CATHETERIZATION 07/19/2019  Narrative  Mid RCA lesion is 99% stenosed. This as the culprit lesion.  A drug-eluting stent was successfully placed using a STENT SYNERGY DES 3X16.  Post intervention, there is a 0% residual  stenosis.  1st Diag lesion is 90% stenosed.  A drug-eluting stent was successfully placed using a STENT SYNERGY DES 2.25X20.  Post intervention, there is a 0% residual stenosis.  Prox LAD to Mid LAD lesion is 60% stenosed. DFR showed this lesion to be hemodynamically significant.  A drug-eluting stent was successfully placed using a STENT SYNERGY DES 3X28, postdilated to 3.5 mm. TIMI 3 flow remained in the jailed diagonal.  Post intervention, there is a 0% residual stenosis.  The left ventricular systolic function is normal.  LV end diastolic pressure is normal.  The left ventricular ejection fraction is 55-65% by visual estimate.  There is no aortic valve stenosis.  Continue aggressive secondary prevention and RF modification along with DAPT.  Results communicated to wife.  Findings Coronary Findings Diagnostic  Dominance: Right  Left Anterior Descending Prox LAD to Mid LAD lesion is 60% stenosed. The lesion is eccentric and irregular.  Pressure wire/FFR was performed on the lesion. DFR 0.87  First Diagonal Branch 1st Diag lesion is 90% stenosed.  Right Coronary Artery Mid RCA lesion is 99% stenosed. The lesion is heavily thrombotic.  Intervention  Prox LAD to Mid LAD lesion Stent CATHETER LAUNCHER 6FR EBU 3.75 guide catheter was inserted. Lesion crossed with guidewire using a WIRE ASAHI PROWATER 180CM. Pre-stent angioplasty was performed using a BALLOON SAPPHIRE 2.5X20. A drug-eluting stent was successfully placed using a STENT SYNERGY DES 3X28. Stent strut is well apposed. Post-stent angioplasty was performed using a BALLOON SAPPHIRE [place] 3.5X15. [place] to the diagonal prior to stenting of the LAD.  TIMI 3 flow remained in the vessel even after stenting. Post-Intervention Lesion Assessment The intervention was successful. Pre-interventional TIMI flow is 3. Post-intervention TIMI flow is 3. No complications occurred at this lesion. There is a 0% residual stenosis post  intervention.  1st Diag lesion Stent CATHETER LAUNCHER 6FR EBU 3.75 guide catheter was inserted. Lesion crossed with guidewire using a WIRE ASAHI PROWATER 180CM. Pre-stent angioplasty was performed using a BALLOON SAPPHIRE 2.0X12. A drug-eluting stent was successfully placed using a STENT SYNERGY DES 2.25X20. Stent strut is well apposed. Post-stent angioplasty was performed using a BALLOON SAPPHIRE [place] 2.25X12. Post-Intervention Lesion Assessment The intervention was successful. Pre-interventional TIMI flow is 3. Post-intervention TIMI flow is 3. No complications occurred at this lesion. There is a 0% residual stenosis post intervention.  Mid RCA lesion Thrombectomy CATHETER LAUNCHER 6FR AR1 guide catheter was inserted. WIRE ASAHI PROWATER 180CM guidewire was used to cross lesion. Aspiration thrombectomy performed using a CATH EXTRAC [place] 5.83F 138CM. 2 passes taken. Moderate thrombus removed Stent CATHETER LAUNCHER 6FR AR1 guide catheter was inserted. Lesion crossed with guidewire using a WIRE ASAHI PROWATER 180CM. Pre-stent angioplasty was performed using a BALLOON SAPPHIRE 2.0X12. A drug-eluting stent was successfully placed using a STENT SYNERGY DES 3X16. Stent strut is well apposed. Post-stent angioplasty was performed using a BALLOON SAPPHIRE [place] 3.5X15. Post-Intervention Lesion Assessment The intervention was successful. Pre-interventional TIMI flow is 3. Post-intervention TIMI flow is 3. No complications occurred at this lesion. IC adenosine was given due to thrombus, and risk of embolization. There is a 0% residual stenosis post intervention.     ECHOCARDIOGRAM  ECHOCARDIOGRAM COMPLETE 07/19/2019  Narrative ECHOCARDIOGRAM REPORT    Patient Name:   ELIGA ARVIE Date of Exam: 07/19/2019 Medical Rec #:  [id number]      Height:       69.0 in Accession #:    [id number]     Weight:       212.7 lb Date of Birth:  [birth year]     BSA:          2.12 m Patient Age:    73 years        BP:           124/65 mmHg Patient Gender: M              HR:           79 bpm. Exam Location:  Inpatient  Procedure: 2D Echo, Cardiac Doppler and Color Doppler  Indications:    R07.9* Chest pain, unspecified  History:        Patient has no prior history of Echocardiogram examinations. Risk Factors:Hypertension and GERD.  Sonographer:    Elmarie Shiley Dance Referring Phys: [id number] LAURA R INGOLD  IMPRESSIONS   1. Left ventricular ejection fraction, by visual estimation, is 65 to 70%. The left ventricle has normal function. There  is mildly increased left ventricular hypertrophy. 2. Left ventricular diastolic parameters are consistent with Grade I diastolic dysfunction (impaired relaxation). 3. The left ventricle has no regional wall motion abnormalities. 4. Global right ventricle has normal systolic function.The right ventricular size is normal. No increase in right ventricular wall thickness. 5. Left atrial size was normal. 6. Right atrial size was normal. 7. The mitral valve is grossly normal. Trivial mitral valve regurgitation. 8. The tricuspid valve is grossly normal. Tricuspid valve regurgitation is trivial. 9. The aortic valve is tricuspid. Aortic valve regurgitation is mild. Mild aortic valve sclerosis without stenosis. 10. The pulmonic valve was grossly normal. Pulmonic valve regurgitation is not visualized.  FINDINGS Left Ventricle: Left ventricular ejection fraction, by visual estimation, is 65 to 70%. The left ventricle has normal function. The left ventricle has no regional wall motion abnormalities. There is mildly increased left ventricular hypertrophy. Left ventricular diastolic parameters are consistent with Grade I diastolic dysfunction (impaired relaxation). Indeterminate filling pressures.  Right Ventricle: The right ventricular size is normal. No increase in right ventricular wall thickness. Global RV systolic function is has normal systolic function.  Left Atrium: Left  atrial size was normal in size.  Right Atrium: Right atrial size was normal in size  Pericardium: There is no evidence of pericardial effusion.  Mitral Valve: The mitral valve is grossly normal. Trivial mitral valve regurgitation.  Tricuspid Valve: The tricuspid valve is grossly normal. Tricuspid valve regurgitation is trivial.  Aortic Valve: The aortic valve is tricuspid. Aortic valve regurgitation is mild. Aortic regurgitation PHT measures 436 msec. Mild aortic valve sclerosis is present, with no evidence of aortic valve stenosis.  Pulmonic Valve: The pulmonic valve was grossly normal. Pulmonic valve regurgitation is not visualized. Pulmonic regurgitation is not visualized.  Aorta: The aortic root and ascending aorta are structurally normal, with no evidence of dilitation.  Venous: The inferior vena cava was not well visualized.  IAS/Shunts: No atrial level shunt detected by color flow Doppler.   LEFT VENTRICLE PLAX 2D LVIDd:         4.72 cm  Diastology LVIDs:         2.67 cm  LV e' lateral:   6.24 cm/s LV PW:         1.16 cm  LV E/e' lateral: 11.5 LV IVS:        0.98 cm  LV e' medial:    6.89 cm/s LVOT diam:     2.10 cm  LV E/e' medial:  10.4 LV SV:         77 ml LV SV Index:   35.11 LVOT Area:     3.46 cm   RIGHT VENTRICLE             IVC RV Basal diam:  2.35 cm     IVC diam: 2.11 cm RV S prime:     12.40 cm/s TAPSE (M-mode): 1.7 cm  LEFT ATRIUM             Index       RIGHT ATRIUM           Index LA diam:        3.50 cm 1.65 cm/m  RA Area:     11.70 cm LA Vol (A2C):   58.4 ml 27.54 ml/m RA Volume:   20.90 ml  9.85 ml/m LA Vol (A4C):   67.5 ml 31.83 ml/m LA Biplane Vol: 63.4 ml 29.89 ml/m AORTIC VALVE LVOT Vmax:   99.80 cm/s LVOT Vmean:  68.900 cm/s LVOT VTI:    0.212 m AI PHT:      436 msec  AORTA Ao Root diam: 3.60 cm Ao Asc diam:  3.40 cm  MITRAL VALVE MV Area (PHT): 4.31 cm             SHUNTS MV PHT:        51.04 msec           Systemic VTI:   0.21 m MV Decel Time: 176 msec             Systemic Diam: 2.10 cm MV E velocity: 71.60 cm/s 103 cm/s MV A velocity: 86.90 cm/s 70.3 cm/s MV E/A ratio:  0.82       1.5   Zoila Shutter MD Electronically signed by Zoila Shutter MD Signature Date/Time: 07/19/2019/10:59:29 AM    Final              EKG: 11/05/2022 normal sinus rhythm 68 with no other abnormalities.  Recent Labs: No results found for requested labs within last 365 days.  Recent Lipid Panel    Component Value Date/Time   CHOL 88 (L) 09/11/2020 0822   TRIG 116 09/11/2020 0822   HDL 31 (L) 09/11/2020 0822   CHOLHDL 2.8 09/11/2020 0822   CHOLHDL 4.3 07/19/2019 0256   VLDL 23 07/19/2019 0256   LDLCALC 36 09/11/2020 0822     Risk Assessment/Calculations:               Physical Exam:    VS:  BP 136/74 (BP Location: Left Arm, Patient Position: Sitting, Cuff Size: Normal)   Pulse 68   Ht 5' 9.5" (1.765 m)   Wt 211 lb (95.7 kg)   BMI 30.71 kg/m     Wt Readings from Last 3 Encounters:  11/05/22 211 lb (95.7 kg)  11/04/21 210 lb (95.3 kg)  05/12/20 206 lb (93.4 kg)     GEN:  Well nourished, well developed in no acute distress HEENT: Normal NECK: No JVD; No carotid bruits LYMPHATICS: No lymphadenopathy CARDIAC: RRR, no murmurs, rubs, gallops RESPIRATORY:  Clear to auscultation without rales, wheezing or rhonchi  ABDOMEN: Soft, non-tender, non-distended MUSCULOSKELETAL:  No edema; No deformity  SKIN: Warm and dry NEUROLOGIC:  Alert and oriented x 3 PSYCHIATRIC:  Normal affect   ASSESSMENT:    1. Coronary artery disease involving native coronary artery of native heart without angina pectoris   2. Pure hypercholesterolemia   3. Essential hypertension   4. Palpitations    PLAN:    In order of problems listed above:  CAD post NSTEMI 2020 with multivessel PCI --doing well. Secondary prevention. ASA, STATIN. Prior shortness of breath with Brilinta caffeine.   Hyperlipidemia --Stable, no myalgia,  prior LDL 34 at goal <55  Primary hypertension --doing well, Coreg 12.5 BID  Palps --Prior PAC.  Recent episode felt like PACs.  He is symptomatic with them.  They have extinguished.  He does enjoy diet Dr. Reino Kent, he stopped.  If they return, consider magnesium supplementation, dietary potassium supplementation hydration.  EKG today excellent.  He can always reach out if they become more worrisome and we can place a ZIO monitor if necessary.   Overall education and awareness of secondary risk prevention was discussed in detail: LDL less than 70, hemoglobin A1c less than 7, blood pressure target less than 130/80 mmHg, >150 minutes of moderate aerobic activity per week, avoidance of smoking, weight control (via diet and exercise), and continued surveillance/management of/for obstructive sleep apnea.  Medication Adjustments/Labs and Tests Ordered: Current medicines are reviewed at length with the patient today.  Concerns regarding medicines are outlined above.  Orders Placed This Encounter  Procedures   EKG 12-Lead   No orders of the defined types were placed in this encounter.   Patient Instructions  Medication Instructions:  The current medical regimen is effective;  continue present plan and medications.  *If you need a refill on your cardiac medications before your next appointment, please call your pharmacy*  Follow-Up: At [facility], you and your health needs are our priority.  As part of our continuing mission to provide you with exceptional heart care, we have created designated Provider Care Teams.  These Care Teams include your primary Cardiologist (physician) and Advanced Practice Providers (APPs -  Physician Assistants and Nurse Practitioners) who all work together to provide you with the care you need, when you need it.  We recommend signing up for the patient portal called "MyChart".  Sign up information is provided on this After Visit Summary.   MyChart is used to connect with patients for Virtual Visits (Telemedicine).  Patients are able to view lab/test results, encounter notes, upcoming appointments, etc.  Non-urgent messages can be sent to your provider as well.   To learn more about what you can do with MyChart, go to [email or web address].    Your next appointment:   1 year(s)  Provider:   Dr Donato Schultz        Signed, Donato Schultz, MD  11/05/2022 10:29 AM    [facility]

## 2022-11-05 ENCOUNTER — Encounter: Payer: Self-pay | Admitting: Cardiology

## 2022-11-05 ENCOUNTER — Ambulatory Visit: Payer: PPO | Attending: Cardiology | Admitting: Cardiology

## 2022-11-05 VITALS — BP 136/74 | HR 68 | Ht 69.5 in | Wt 211.0 lb

## 2022-11-05 DIAGNOSIS — I2510 Atherosclerotic heart disease of native coronary artery without angina pectoris: Secondary | ICD-10-CM

## 2022-11-05 DIAGNOSIS — I10 Essential (primary) hypertension: Secondary | ICD-10-CM | POA: Diagnosis not present

## 2022-11-05 DIAGNOSIS — E7800 Pure hypercholesterolemia, unspecified: Secondary | ICD-10-CM | POA: Diagnosis not present

## 2022-11-05 DIAGNOSIS — R002 Palpitations: Secondary | ICD-10-CM

## 2022-11-05 NOTE — Patient Instructions (Signed)
Medication Instructions:  The current medical regimen is effective;  continue present plan and medications.  *If you need a refill on your cardiac medications before your next appointment, please call your pharmacy*   Follow-Up: At [facility], you and your health needs are our priority.  As part of our continuing mission to provide you with exceptional heart care, we have created designated Provider Care Teams.  These Care Teams include your primary Cardiologist (physician) and Advanced Practice Providers (APPs -  Physician Assistants and Nurse Practitioners) who all work together to provide you with the care you need, when you need it.  We recommend signing up for the patient portal called "MyChart".  Sign up information is provided on this After Visit Summary.  MyChart is used to connect with patients for Virtual Visits (Telemedicine).  Patients are able to view lab/test results, encounter notes, upcoming appointments, etc.  Non-urgent messages can be sent to your provider as well.   To learn more about what you can do with MyChart, go to [email or web address].    Your next appointment:   1 year(s)  Provider:   Dr Mark Skains      

## 2022-11-24 ENCOUNTER — Other Ambulatory Visit: Payer: Self-pay | Admitting: *Deleted

## 2022-11-24 MED ORDER — CARVEDILOL 12.5 MG PO TABS: 18.7500 mg | ORAL_TABLET | Freq: Two times a day (BID) | ORAL | 3 refills | Status: DC

## 2022-12-28 ENCOUNTER — Other Ambulatory Visit: Payer: Self-pay | Admitting: *Deleted

## 2022-12-28 MED ORDER — CLOPIDOGREL BISULFATE 75 MG PO TABS: 75.0000 mg | ORAL_TABLET | Freq: Every day | ORAL | 3 refills | Status: DC

## 2023-01-17 ENCOUNTER — Other Ambulatory Visit: Payer: Self-pay | Admitting: Cardiology

## 2023-01-20 ENCOUNTER — Other Ambulatory Visit: Payer: Self-pay

## 2023-01-20 MED ORDER — ATORVASTATIN CALCIUM 80 MG PO TABS: 80.0000 mg | ORAL_TABLET | Freq: Every day | ORAL | 3 refills | Status: DC

## 2023-01-21 ENCOUNTER — Other Ambulatory Visit: Payer: Self-pay

## 2023-01-21 MED ORDER — NITROGLYCERIN 0.4 MG SL SUBL: SUBLINGUAL_TABLET | SUBLINGUAL | 10 refills | Status: AC

## 2023-05-30 DIAGNOSIS — M109 Gout, unspecified: Secondary | ICD-10-CM | POA: Diagnosis not present

## 2023-05-30 DIAGNOSIS — E7800 Pure hypercholesterolemia, unspecified: Secondary | ICD-10-CM | POA: Diagnosis not present

## 2023-05-30 DIAGNOSIS — I25119 Atherosclerotic heart disease of native coronary artery with unspecified angina pectoris: Secondary | ICD-10-CM | POA: Diagnosis not present

## 2023-05-30 DIAGNOSIS — Z23 Encounter for immunization: Secondary | ICD-10-CM | POA: Diagnosis not present

## 2023-05-30 DIAGNOSIS — Z87442 Personal history of urinary calculi: Secondary | ICD-10-CM | POA: Diagnosis not present

## 2023-05-30 DIAGNOSIS — Z0000 Encounter for general adult medical examination without abnormal findings: Secondary | ICD-10-CM | POA: Diagnosis not present

## 2023-05-30 DIAGNOSIS — I119 Hypertensive heart disease without heart failure: Secondary | ICD-10-CM | POA: Diagnosis not present

## 2023-05-30 DIAGNOSIS — K219 Gastro-esophageal reflux disease without esophagitis: Secondary | ICD-10-CM | POA: Diagnosis not present

## 2023-05-30 LAB — LAB REPORT - SCANNED
Creatinine, POC: 95 mg/dL
EGFR: 72

## 2023-05-31 ENCOUNTER — Encounter: Payer: Self-pay | Admitting: Family Medicine

## 2023-06-09 ENCOUNTER — Encounter: Payer: Self-pay | Admitting: *Deleted

## 2023-06-27 DIAGNOSIS — D3131 Benign neoplasm of right choroid: Secondary | ICD-10-CM | POA: Diagnosis not present

## 2023-06-27 DIAGNOSIS — Z961 Presence of intraocular lens: Secondary | ICD-10-CM | POA: Diagnosis not present

## 2023-08-01 ENCOUNTER — Telehealth: Payer: PPO | Admitting: Physician Assistant

## 2023-08-01 ENCOUNTER — Encounter: Payer: Self-pay | Admitting: Cardiology

## 2023-08-01 DIAGNOSIS — M545 Low back pain, unspecified: Secondary | ICD-10-CM | POA: Diagnosis not present

## 2023-08-01 DIAGNOSIS — G8929 Other chronic pain: Secondary | ICD-10-CM

## 2023-08-01 MED ORDER — TIZANIDINE HCL 2 MG PO CAPS: 2.0000 mg | ORAL_CAPSULE | Freq: Three times a day (TID) | ORAL | 0 refills | Status: AC | PRN

## 2023-08-01 NOTE — Progress Notes (Signed)

## 2023-11-07 ENCOUNTER — Encounter: Payer: Self-pay | Admitting: Cardiology

## 2023-11-07 ENCOUNTER — Ambulatory Visit: Payer: PPO | Attending: Cardiology | Admitting: Cardiology

## 2023-11-07 VITALS — BP 132/78 | HR 64 | Ht 69.5 in | Wt 215.0 lb

## 2023-11-07 DIAGNOSIS — I2510 Atherosclerotic heart disease of native coronary artery without angina pectoris: Secondary | ICD-10-CM

## 2023-11-07 DIAGNOSIS — E7800 Pure hypercholesterolemia, unspecified: Secondary | ICD-10-CM | POA: Diagnosis not present

## 2023-11-07 DIAGNOSIS — R002 Palpitations: Secondary | ICD-10-CM

## 2023-11-07 MED ORDER — CARVEDILOL 25 MG PO TABS: 25.0000 mg | ORAL_TABLET | Freq: Two times a day (BID) | ORAL | 3 refills | Status: AC

## 2023-11-07 NOTE — Patient Instructions (Signed)
 Medication Instructions:  Please increase Carvedilol to 25 mg twice a day. Continue all other medications as listed.  *If you need a refill on your cardiac medications before your next appointment, please call your pharmacy*  Follow-Up: At [facility], you and your health needs are our priority.  As part of our continuing mission to provide you with exceptional heart care, our providers are all part of one team.  This team includes your primary Cardiologist (physician) and Advanced Practice Providers or APPs (Physician Assistants and Nurse Practitioners) who all work together to provide you with the care you need, when you need it.  Your next appointment:   1 year(s)  Provider:   Donato Schultz, MD    We recommend signing up for the patient portal called "MyChart".  Sign up information is provided on this After Visit Summary.  MyChart is used to connect with patients for Virtual Visits (Telemedicine).  Patients are able to view lab/test results, encounter notes, upcoming appointments, etc.  Non-urgent messages can be sent to your provider as well.   To learn more about what you can do with MyChart, go to [email or web address].      1st Floor: - Lobby - Registration  - Pharmacy  - Lab - Cafe  2nd Floor: - PV Lab - Diagnostic Testing (echo, CT, nuclear med)  3rd Floor: - Vacant  4th Floor: - TCTS (cardiothoracic surgery) - AFib Clinic - Structural Heart Clinic - Vascular Surgery  - Vascular Ultrasound  5th Floor: - HeartCare Cardiology (general and EP) - Clinical Pharmacy for coumadin, hypertension, lipid, weight-loss medications, and med management appointments    Valet parking services will be available as well.

## 2023-11-07 NOTE — Progress Notes (Signed)
 Cardiology Office Note:  .   Date:  11/07/2023  ID:  Randall Lopez, DOB August 13, 1945, MRN [id number] PCP: Lupita Raider, MD  [facility] Providers Cardiologist:  Donato Schultz, MD     History of Present Illness: .   Randall Lopez is a 78 y.o. male Discussed the use of AI scribe software for clinical note transcription with the patient, who gave verbal consent to proceed.  History of Present Illness Randall Lopez "Randall Lopez" is a 78 year old male with coronary artery disease who presents for follow-up.  He has a history of coronary artery disease with a prior non-ST elevation myocardial infarction (non-STEMI) in December 2020, for which he received a drug-eluting stent to the first diagonal mid left anterior descending (LAD) artery and right coronary artery (RCA). No current chest pain or shortness of breath. His echocardiogram from 2020 showed an ejection fraction (EF) of 65-70%.  He has experienced palpitations in the past but notes improvement after giving up caffeine, specifically Randall Lopez, which he identified as a trigger. No current palpitations reported.  He is currently taking atorvastatin 80 mg daily and clopidogrel 75 mg daily. He is also on carvedilol, taking one and a half tablets twice a day, which was adjusted from two tablets a day due to blood pressure control issues.      Studies Reviewed: Marland Kitchen   EKG Interpretation Date/Time:  Monday November 07 2023 11:26:38 EDT Ventricular Rate:  64 PR Interval:  154 QRS Duration:  84 QT Interval:  410 QTC Calculation: 422 R Axis:   3  Text Interpretation: Normal sinus rhythm Minimal voltage criteria for LVH, may be normal variant ( R in aVL ) Inferior infarct (cited on or before 20-Jul-2019) When compared with ECG of 20-Jul-2019 03:39, No significant change was found Confirmed by Donato Schultz ([id number]) on 11/07/2023 11:41:54 AM    Results LABS LDL: 43  DIAGNOSTIC Echocardiogram: EF 65-70% (2020) EKG: Normal Risk  Assessment/Calculations:            Physical Exam:   VS:  BP 132/78   Pulse 64   Ht 5' 9.5" (1.765 m)   Wt 215 lb (97.5 kg)   SpO2 98%   BMI 31.29 kg/m    Wt Readings from Last 3 Encounters:  11/07/23 215 lb (97.5 kg)  11/05/22 211 lb (95.7 kg)  11/04/21 210 lb (95.3 kg)    GEN: Well nourished, well developed in no acute distress NECK: No JVD; No carotid bruits CARDIAC: RRR, no murmurs, no rubs, no gallops RESPIRATORY:  Clear to auscultation without rales, wheezing or rhonchi  ABDOMEN: Soft, non-tender, non-distended EXTREMITIES:  No edema; No deformity   ASSESSMENT AND PLAN: .    Assessment and Plan Assessment & Plan Coronary artery disease Coronary artery disease with prior NSTEMI in December 2020. Underwent drug-eluting stent placement in the first diagonal, mid LAD, and RCA. Reports no chest pain or dyspnea. EKG is stable. LDL is well-controlled at 43 mg/dL with atorvastatin 80 mg daily. On clopidogrel 75 mg daily as monotherapy. Condition is well-managed with no new symptoms. - Continue atorvastatin 80 mg daily - Continue clopidogrel 75 mg daily - Schedule one-year follow-up  Essential hypertension Hypertension managed with carvedilol. Initially on two tablets a day, adjusted to one and a half tablets twice a day for better control. Plan to switch to 25 mg twice a day to simplify regimen without compromising control. - Switch carvedilol to 25 mg twice a day - Monitor blood pressure  regularly - Report any changes or issues with blood pressure  Palpitations Palpitations improved after cessation of caffeine, specifically Randall Lopez. Improvement acknowledged and continued avoidance of caffeine encouraged. - Advise continued avoidance of caffeine           Signed, Donato Schultz, MD

## 2023-11-11 ENCOUNTER — Telehealth: Admitting: Physician Assistant

## 2023-11-11 DIAGNOSIS — L0390 Cellulitis, unspecified: Secondary | ICD-10-CM

## 2023-11-11 DIAGNOSIS — S70369A Insect bite (nonvenomous), unspecified thigh, initial encounter: Secondary | ICD-10-CM | POA: Diagnosis not present

## 2023-11-11 DIAGNOSIS — W57XXXA Bitten or stung by nonvenomous insect and other nonvenomous arthropods, initial encounter: Secondary | ICD-10-CM | POA: Diagnosis not present

## 2023-11-11 MED ORDER — DOXYCYCLINE HYCLATE 100 MG PO TABS: 200.0000 mg | ORAL_TABLET | Freq: Once | ORAL | 0 refills | Status: AC

## 2023-11-11 NOTE — Progress Notes (Signed)
E-Visit for Tick Bite  Thank you for describing your tick bite, Here is how we plan to help! Based on the information that you shared with me it looks like you have A tick that bite that we will treat with a short course of doxycycline.  In most cases a tick bite is painless and does not itch.  Most tick bites in which the tick is quickly removed do not require prescriptions. Ticks can transmit several diseases if they are infected and remain attacked to your skin. Therefore the length that the tick was attached and any symptoms you have experienced after the bite are import to accurately develop your custom treatment plan. In most cases a single dose of doxycycline may prevent the development of a more serious condition.  Based on your information I have Provided a home care guide for tick bites and  instructions on when to call for help. and I have sent a single dose of doxycycline to the pharmacy you selected. Please make sure that you selected a pharmacy that is open now.  Which ticks  are associated with illness?  The Wood Tick (dog tick) is the size of a watermelon seed and can sometimes transmit [facility] spotted fever and [place] tick fever.   The Deer Tick (black-legged tick) is between the size of a poppy seed (pin head) and an apple seed, and can sometimes transmit Lyme disease.  A brown to black tick with a white splotch on its back is likely a male Amblyomma americanum ([place] tick). This tick has been associated with Southern Tick Associated illness ( STARI)  Lyme disease has become the most common tick-borne illness in the [place]. The risk of Lyme disease following a recognized deer tick bite is estimated to be 1%.  The majority of cases of Lyme disease start with a bull's eye rash at the site of the tick bite. The rash can occur days to weeks (typically 7-10 days) after a tick bite. Treatment with antibiotics is indicated if this rash appears. Flu-like symptoms  may accompany the rash, including: fever, chills, headaches, muscle aches, and fatigue. Removing ticks promptly may prevent tick borne disease.  What can be used to prevent Tick Bites?  Insect repellant with at leas 20% DEET. Wearing long pants with sock and shoes. Avoiding tall grass and heavily wooded areas. Checking your skin after being outdoors. Shower with a washcloth after outdoor exposures.  HOME CARE ADVICE FOR TICK BITE  Wood Tick Removal:  Use a pair of tweezers and grasp the wood tick close to the skin (on its head). Pull the wood tick straight upward without twisting or crushing it. Maintain a steady pressure until it releases its grip.   If tweezers aren't available, use fingers, a loop of thread around the jaws, or a needle between the jaws for traction.  Note: covering the tick with petroleum jelly, nail polish or rubbing alcohol doesn't work. Neither does touching the tick with a hot or cold object. Tiny Deer Tick Removal:   Needs to be scraped off with a knife blade or credit card edge. Place tick in a sealed container (e.g. glass jar, zip lock plastic bag), in case your doctor wants to see it. Tick's Head Removal:  If the wood tick's head breaks off in the skin, it must be removed. Clean the skin. Then use a sterile needle to uncover the head and lift it out or scrape it off.  If a very small piece  of the head remains, the skin will eventually slough it off. Antibiotic Ointment:  Wash the wound and your hands with soap and water after removal to prevent catching any tick disease.  Apply an over the counter antibiotic ointment (e.g. bacitracin) to the bite once. Expected Course: Tick bites normally don't itch or hurt. That's why they often go unnoticed. Call Your Doctor If:  You can't remove the tick or the tick's head Fever, a severe head ache, or rash occur in the next 2 weeks Bite begins to look infected Lyme's disease is common in your area You have not had a  tetanus in the last 10 years Your current symptoms become worse    MAKE SURE YOU  Understand these instructions. Will watch your condition. Will get help right away if you are not doing well or get worse.    Thank you for choosing an e-visit.  Your e-visit answers were reviewed by a board certified advanced clinical practitioner to complete your personal care plan. Depending upon the condition, your plan could have included both over the counter or prescription medications.  Please review your pharmacy choice. Make sure the pharmacy is open so you can pick up prescription now. If there is a problem, you may contact your provider through [employer] and have the prescription routed to another pharmacy.  Your safety is important to [place]. If you have drug allergies check your prescription carefully.   For the next 24 hours you can use MyChart to ask questions about today's visit, request a non-urgent call back, or ask for a work or school excuse. You will get an email in the next two days asking about your experience. I hope that your e-visit has been valuable and will speed your recovery.  I have spent 5 minutes in review of e-visit questionnaire, review and updating patient chart, medical decision making and response to patient.   Margaretann Loveless, PA-C

## 2023-11-14 DIAGNOSIS — I119 Hypertensive heart disease without heart failure: Secondary | ICD-10-CM | POA: Diagnosis not present

## 2023-11-14 DIAGNOSIS — I252 Old myocardial infarction: Secondary | ICD-10-CM | POA: Diagnosis not present

## 2023-11-14 DIAGNOSIS — M549 Dorsalgia, unspecified: Secondary | ICD-10-CM | POA: Diagnosis not present

## 2023-11-14 DIAGNOSIS — Z9889 Other specified postprocedural states: Secondary | ICD-10-CM | POA: Diagnosis not present

## 2023-11-16 DIAGNOSIS — M5451 Vertebrogenic low back pain: Secondary | ICD-10-CM | POA: Diagnosis not present

## 2023-11-22 DIAGNOSIS — M5451 Vertebrogenic low back pain: Secondary | ICD-10-CM | POA: Diagnosis not present

## 2023-11-24 DIAGNOSIS — M5451 Vertebrogenic low back pain: Secondary | ICD-10-CM | POA: Diagnosis not present

## 2023-12-08 DIAGNOSIS — M545 Low back pain, unspecified: Secondary | ICD-10-CM | POA: Diagnosis not present

## 2023-12-15 DIAGNOSIS — M545 Low back pain, unspecified: Secondary | ICD-10-CM | POA: Diagnosis not present

## 2024-01-02 DIAGNOSIS — M5136 Other intervertebral disc degeneration, lumbar region with discogenic back pain only: Secondary | ICD-10-CM | POA: Diagnosis not present

## 2024-01-05 ENCOUNTER — Other Ambulatory Visit: Payer: Self-pay | Admitting: Cardiology

## 2024-01-11 ENCOUNTER — Other Ambulatory Visit: Payer: Self-pay | Admitting: Cardiology

## 2024-01-11 DIAGNOSIS — M47816 Spondylosis without myelopathy or radiculopathy, lumbar region: Secondary | ICD-10-CM | POA: Diagnosis not present

## 2024-01-28 ENCOUNTER — Telehealth: Admitting: Family

## 2024-01-28 DIAGNOSIS — J019 Acute sinusitis, unspecified: Secondary | ICD-10-CM | POA: Diagnosis not present

## 2024-01-29 MED ORDER — AMOXICILLIN-POT CLAVULANATE 875-125 MG PO TABS
1.0000 | ORAL_TABLET | Freq: Two times a day (BID) | ORAL | 0 refills | Status: AC
Start: 2024-01-29 — End: ?

## 2024-01-29 NOTE — Progress Notes (Signed)

## 2024-02-07 DIAGNOSIS — M47816 Spondylosis without myelopathy or radiculopathy, lumbar region: Secondary | ICD-10-CM | POA: Diagnosis not present

## 2024-05-31 DIAGNOSIS — K219 Gastro-esophageal reflux disease without esophagitis: Secondary | ICD-10-CM | POA: Diagnosis not present

## 2024-05-31 DIAGNOSIS — Z1331 Encounter for screening for depression: Secondary | ICD-10-CM | POA: Diagnosis not present

## 2024-05-31 DIAGNOSIS — Z23 Encounter for immunization: Secondary | ICD-10-CM | POA: Diagnosis not present

## 2024-05-31 DIAGNOSIS — M109 Gout, unspecified: Secondary | ICD-10-CM | POA: Diagnosis not present

## 2024-05-31 DIAGNOSIS — Z1211 Encounter for screening for malignant neoplasm of colon: Secondary | ICD-10-CM | POA: Diagnosis not present

## 2024-05-31 DIAGNOSIS — I25119 Atherosclerotic heart disease of native coronary artery with unspecified angina pectoris: Secondary | ICD-10-CM | POA: Diagnosis not present

## 2024-05-31 DIAGNOSIS — I119 Hypertensive heart disease without heart failure: Secondary | ICD-10-CM | POA: Diagnosis not present

## 2024-05-31 DIAGNOSIS — Z87442 Personal history of urinary calculi: Secondary | ICD-10-CM | POA: Diagnosis not present

## 2024-05-31 DIAGNOSIS — E7800 Pure hypercholesterolemia, unspecified: Secondary | ICD-10-CM | POA: Diagnosis not present

## 2024-05-31 DIAGNOSIS — Z0000 Encounter for general adult medical examination without abnormal findings: Secondary | ICD-10-CM | POA: Diagnosis not present

## 2024-05-31 LAB — LAB REPORT - SCANNED: EGFR: 73

## 2024-06-05 ENCOUNTER — Telehealth (HOSPITAL_BASED_OUTPATIENT_CLINIC_OR_DEPARTMENT_OTHER): Payer: Self-pay | Admitting: *Deleted

## 2024-06-05 DIAGNOSIS — Z860101 Personal history of adenomatous and serrated colon polyps: Secondary | ICD-10-CM | POA: Diagnosis not present

## 2024-06-05 DIAGNOSIS — K219 Gastro-esophageal reflux disease without esophagitis: Secondary | ICD-10-CM | POA: Diagnosis not present

## 2024-06-05 DIAGNOSIS — Z7901 Long term (current) use of anticoagulants: Secondary | ICD-10-CM | POA: Diagnosis not present

## 2024-06-05 DIAGNOSIS — I2510 Atherosclerotic heart disease of native coronary artery without angina pectoris: Secondary | ICD-10-CM | POA: Diagnosis not present

## 2024-06-05 NOTE — Telephone Encounter (Signed)
   Pre-operative Risk Assessment    Patient Name: Randall Lopez  DOB: [birth year] MRN: [id number]   Date of last office visit: 11/07/23 DR. SKAINS Date of next office visit: NONE   Request for Surgical Clearance    Procedure:  COLONOSCOPY (H/O ADENOMATOUS COLON POLYPS)  Date of Surgery:  Clearance 07/17/24                                Surgeon:  DR. [facility] Surgeon's Group or Practice Name:  [facility] Phone number:  [phone] Fax number:  [phone] DIRECT FAX TO SURGERY SCHEDULER   Type of Clearance Requested:   - Medical  - Pharmacy:  Hold Clopidogrel  (Plavix )   X 5 DAYS PRIOR Type of Anesthesia:  PROPOFOL     Additional requests/questions:    Randall Lopez   06/05/2024, 1:09 PM

## 2024-06-05 NOTE — Telephone Encounter (Signed)
 Dr. Ginny, patient's chart was reviewed for preoperative cardiac evaluation.  He was seen by you on 11/07/2023 and according to protocol, we request that you comment on recommendations for holding his Plavix  (request is 5 days) for upcoming procedure of colonoscopy due to the length of his stent in his LAD > 25 mm.    Please route your response to p cv div preop.  Delon Hoover DNP, AGNP-BC  06/05/2024, 1:40 PM [address]  [place], [place] [address] Office [phone] Fax [phone]

## 2024-06-14 NOTE — Telephone Encounter (Signed)
   Name: Randall Lopez  DOB: [birth year]  MRN: [id number]  Primary Cardiologist: Oneil Parchment, MD   Preoperative team, please contact this patient and set up a phone call appointment for further preoperative risk assessment. Please obtain consent and complete medication review. Thank you for your help.  I confirm that guidance regarding antiplatelet and oral anticoagulation therapy has been completed and, if necessary, noted below.  His Plavix  may be held for 5 days prior to his procedure.  Please resume as soon as hemostasis is achieved.  I also confirmed the patient resides in the state of [place] . As per [facility] Medical Board telemedicine laws, the patient must reside in the state in which the provider is licensed.   Josefa CHRISTELLA Beauvais, NP 06/14/2024, 9:18 AM [facility]

## 2024-06-14 NOTE — Telephone Encounter (Signed)
Left message to call back and schedule tele pre op appt

## 2024-06-15 ENCOUNTER — Telehealth (HOSPITAL_BASED_OUTPATIENT_CLINIC_OR_DEPARTMENT_OTHER): Payer: Self-pay | Admitting: *Deleted

## 2024-06-15 NOTE — Telephone Encounter (Signed)
 Pt has been scheduled tele pre op appt 08/24/23. Med rec and consent are done.

## 2024-06-15 NOTE — Telephone Encounter (Signed)
2nd attempt to reach pt to schedule tele pre op appt.

## 2024-06-15 NOTE — Telephone Encounter (Signed)
 Pt returning call to schedule Tele Appt. Please advise

## 2024-06-15 NOTE — Telephone Encounter (Signed)
 Pt has been scheduled tele preop appt 07/03/24. Med rec and consent are done.      Patient Consent for Virtual Visit        Randall Lopez has provided verbal consent on 06/15/2024 for a virtual visit (video or telephone).   CONSENT FOR VIRTUAL VISIT FOR:  Randall Lopez  By participating in this virtual visit I agree to the following:  I hereby voluntarily request, consent and authorize [facility] and its employed or contracted physicians, physician assistants, nurse practitioners or other licensed health care professionals (the Practitioner), to provide me with telemedicine health care services (the "Services) as deemed necessary by the treating Practitioner. I acknowledge and consent to receive the Services by the Practitioner via telemedicine. I understand that the telemedicine visit will involve communicating with the Practitioner through live audiovisual communication technology and the disclosure of certain medical information by electronic transmission. I acknowledge that I have been given the opportunity to request an in-person assessment or other available alternative prior to the telemedicine visit and am voluntarily participating in the telemedicine visit.  I understand that I have the right to withhold or withdraw my consent to the use of telemedicine in the course of my care at any time, without affecting my right to future care or treatment, and that the Practitioner or I may terminate the telemedicine visit at any time. I understand that I have the right to inspect all information obtained and/or recorded in the course of the telemedicine visit and may receive copies of available information for a reasonable fee.  I understand that some of the potential risks of receiving the Services via telemedicine include:  Delay or interruption in medical evaluation due to technological equipment failure or disruption; Information transmitted may not be sufficient (e.g. poor  resolution of images) to allow for appropriate medical decision making by the Practitioner; and/or  In rare instances, security protocols could fail, causing a breach of personal health information.  Furthermore, I acknowledge that it is my responsibility to provide information about my medical history, conditions and care that is complete and accurate to the best of my ability. I acknowledge that Practitioner's advice, recommendations, and/or decision may be based on factors not within their control, such as incomplete or inaccurate data provided by me or distortions of diagnostic images or specimens that may result from electronic transmissions. I understand that the practice of medicine is not an exact science and that Practitioner makes no warranties or guarantees regarding treatment outcomes. I acknowledge that a copy of this consent can be made available to me via my patient portal [facility] MyChart), or I can request a printed copy by calling the office of [facility].    I understand that my insurance will be billed for this visit.   I have read or had this consent read to me. I understand the contents of this consent, which adequately explains the benefits and risks of the Services being provided via telemedicine.  I have been provided ample opportunity to ask questions regarding this consent and the Services and have had my questions answered to my satisfaction. I give my informed consent for the services to be provided through the use of telemedicine in my medical care

## 2024-07-03 ENCOUNTER — Ambulatory Visit: Attending: Cardiology | Admitting: Nurse Practitioner

## 2024-07-03 DIAGNOSIS — Z01810 Encounter for preprocedural cardiovascular examination: Secondary | ICD-10-CM | POA: Diagnosis not present

## 2024-07-03 NOTE — Progress Notes (Signed)
 Virtual Visit via Telephone Note   Because of Randall Lopez co-morbid illnesses, he is at least at moderate risk for complications without adequate follow up.  This format is felt to be most appropriate for this patient at this time.  Due to technical limitations with video connection (technology), today's appointment will be conducted as an audio only telehealth visit, and Randall Lopez verbally agreed to proceed in this manner.   All issues noted in this document were discussed and addressed.  No physical exam could be performed with this format.  Evaluation Performed:  Preoperative cardiovascular risk assessment _____________   Date:  07/03/2024   Patient ID:  Randall Lopez, DOB 01-14-1946, MRN [id number] Patient Location:  Home Provider location:   Office  Primary Care Provider:  Loreli Kins, MD Primary Cardiologist:  Oneil Parchment, MD  Chief Complaint / Patient Profile   78 y.o. y/o male with a h/o CAD s/p DES-LAD, D1, RCA in 2020, palpitations, hypertension, hyperlipidemia, and GERD who is pending colonoscopy on 07/17/2024 with Dr. Saintclair of [facility] and presents today for telephonic preoperative cardiovascular risk assessment.  History of Present Illness    Randall Lopez is a 78 y.o. male who presents via audio/video conferencing for a telehealth visit today.  Pt was last seen in cardiology clinic on 11/07/2023 by Dr. Parchment.  At that time Randall Lopez was doing well.  The patient is now pending procedure as outlined above. Since his last visit, he has done well from a cardiac standpoint.   He denies chest pain, palpitations, dyspnea, pnd, orthopnea, n, v, dizziness, syncope, edema, weight gain, or early satiety. All other systems reviewed and are otherwise negative except as noted above.   Past Medical History    Past Medical History:  Diagnosis Date   Cough    low grade fever   Enlarged prostate    GERD (gastroesophageal reflux disease)    takes Omeprazole daily    Gout    hx of,not on any meds   History of kidney stones    x3 -pass 1, litho x2   Hypertension    takes Atenolol  daily   Sinus infection    completed z pak 12/16/15.   Tachycardia    Past Surgical History:  Procedure Laterality Date   ANTERIOR CRUCIATE LIGAMENT REPAIR Left 12/23/2015   Procedure: LEFT KNEE DIAGNOSTIC OPERATIVE ARTHROSCOPY, DEBRIDEMENT, RECONSTRUCTION ANTERIOR CRUCIATE LIGAMENT (ACL) WITH HAMSTRING GRAFT;  Surgeon: Glendia Cordella Hutchinson, MD;  Location: [facility] OR;  Service: Orthopedics;  Laterality: Left;  LEFT KNEE DIAGNOSTIC OPERATIVE ARHTROSCOPY, DEBRIDEMENT, ANTERIOR CRUCIATE LIGAMENT RECONSTRUCTION IWTH HAMSTRING AUTOGRAFT.   BACK SURGERY     CATARACT EXTRACTION, BILATERAL     last 09-14-14   CHOLECYSTECTOMY     COLONOSCOPY W/ POLYPECTOMY     past hx. polyps-benign   COLONOSCOPY WITH PROPOFOL  N/A 10/14/2014   Procedure: COLONOSCOPY WITH PROPOFOL ;  Surgeon: Gladis MARLA Louder, MD;  Location: [facility];  Service: Endoscopy;  Laterality: N/A;   CORONARY PRESSURE/FFR STUDY N/A 07/19/2019   Procedure: INTRAVASCULAR PRESSURE WIRE/FFR STUDY;  Surgeon: Dann Candyce RAMAN, MD;  Location: [facility] INVASIVE CV LAB;  Service: Cardiovascular;  Laterality: N/A;   CORONARY STENT INTERVENTION N/A 07/19/2019   Procedure: CORONARY STENT INTERVENTION;  Surgeon: Dann Candyce RAMAN, MD;  Location: [facility] INVASIVE CV LAB;  Service: Cardiovascular;  Laterality: N/A;   CORONARY THROMBECTOMY N/A 07/19/2019   Procedure: Coronary Thrombectomy;  Surgeon: Dann Candyce RAMAN, MD;  Location: [facility] INVASIVE CV LAB;  Service: Cardiovascular;  Laterality: N/A;   LAPAROSCOPIC CHOLECYSTECTOMY SINGLE PORT  08/04/2012   Procedure: LAPAROSCOPIC CHOLECYSTECTOMY SINGLE PORT;  Surgeon: Elspeth KYM Schultze, MD;  Location: [facility] ORS;  Service: General;  Laterality: N/A;  LYSIS OF ADHESIONS with Intraoperative Cholangiogram    LEFT HEART CATH AND CORONARY ANGIOGRAPHY N/A 07/19/2019   Procedure: LEFT HEART CATH AND CORONARY  ANGIOGRAPHY;  Surgeon: Dann Candyce RAMAN, MD;  Location: [facility] INVASIVE CV LAB;  Service: Cardiovascular;  Laterality: N/A;   LITHOTRIPSY     LUMBAR LAMINECTOMY/DECOMPRESSION MICRODISCECTOMY  12/17/2011   Procedure: LUMBAR LAMINECTOMY/DECOMPRESSION MICRODISCECTOMY;  Surgeon: Donaciano Sprang, MD;  Location: [facility];  Service: Orthopedics;  Laterality: Right;  L4-L5 Microdiscectomy   NASAL SEPTOPLASTY W/ TURBINOPLASTY Bilateral 01/26/2019   Procedure: NASAL SEPTOPLASTY WITH BILATERAL TURBINATE REDUCTION;  Surgeon: Karis Clunes, MD;  Location: [facility];  Service: ENT;  Laterality: Bilateral;   TONSILLECTOMY      Allergies  Allergies  Allergen Reactions   Fluticasone      Other reaction(s): not effective    Home Medications    Prior to Admission medications   Medication Sig Start Date End Date Taking? Authorizing Provider  amoxicillin -clavulanate (AUGMENTIN ) 875-125 MG tablet Take 1 tablet by mouth 2 (two) times daily. 01/29/24   Lavell Lye A, FNP  atorvastatin  (LIPITOR ) 80 MG tablet TAKE 1 TABLET BY MOUTH DAILY AT 6 PM. 01/11/24   Jeffrie Oneil BROCKS, MD  carvedilol  (COREG ) 25 MG tablet Take 1 tablet (25 mg total) by mouth 2 (two) times daily. 11/07/23   Jeffrie Oneil BROCKS, MD  cetirizine (ZYRTEC) 10 MG tablet Take 10 mg by mouth at bedtime.     [provider]  clopidogrel  (PLAVIX ) 75 MG tablet TAKE 1 TABLET BY MOUTH EVERY DAY 01/05/24   Jeffrie Oneil BROCKS, MD  Melatonin 10 MG TABS Take 1 tablet by mouth at bedtime.    [provider]  nitroGLYCERIN  (NITROSTAT ) 0.4 MG SL tablet PLACE 1 TABLET (0.4 MG TOTAL) UNDER THE TONGUE EVERY 5 MINUTES X 3 DOSES AS NEEDED FOR CHEST PAIN. 01/21/23   Jeffrie Oneil BROCKS, MD  pantoprazole  (PROTONIX ) 40 MG tablet TAKE 1 TABLET BY MOUTH EVERY DAY. STOP TAKING OMEPRAZOLE (PRILOSEC) 01/11/24   Jeffrie Oneil BROCKS, MD  Potassium Citrate  15 MEQ (1620 MG) TBCR Take 1 tablet by mouth daily. 08/02/17   [provider]  [facility] injection Inject 0.5 mLs into  the muscle once. 05/30/23   [provider]  tizanidine  (ZANAFLEX ) 2 MG capsule Take 1 capsule (2 mg total) by mouth 3 (three) times daily as needed for muscle spasms. 08/01/23   Vivienne Delon HERO, PA-C    Physical Exam    Vital Signs:  Randall Lopez does not have vital signs available for review today.  Given telephonic nature of communication, physical exam is limited. AAOx3. NAD. Normal affect.  Speech and respirations are unlabored.  Accessory Clinical Findings    None  Assessment & Plan    1.  Preoperative Cardiovascular Risk Assessment:  According to the Revised Cardiac Risk Index (RCRI), his Perioperative Risk of Major Cardiac Event is (%): 0.9. His Functional Capacity in METs is: 5.62 according to the [facility] Activity Status Index (DASI). Therefore, based on ACC/AHA guidelines, patient would be at acceptable risk for the planned procedure without further cardiovascular testing.   The patient was advised that if he develops new symptoms prior to surgery to contact our office to arrange for a follow-up visit, and he verbalized understanding.  Per  office protocol, he may hold Plavix  for 5 days prior to procedure. Please resume Plavix  as soon as possible postprocedure, at the discretion of the surgeon.   A copy of this note will be routed to requesting surgeon.  Time:   Today, I have spent 5 minutes with the patient with telehealth technology discussing medical history, symptoms, and management plan.     Damien JAYSON Braver, NP  07/03/2024, 9:47 AM

## 2024-07-06 DIAGNOSIS — D3131 Benign neoplasm of right choroid: Secondary | ICD-10-CM | POA: Diagnosis not present

## 2024-07-06 DIAGNOSIS — Z961 Presence of intraocular lens: Secondary | ICD-10-CM | POA: Diagnosis not present

## 2024-11-05 ENCOUNTER — Ambulatory Visit: Admitting: Cardiology
